# Patient Record
Sex: Female | Born: 1965
Health system: Southern US, Community
[De-identification: ages and names within clinical notes are randomized; demographics above are authoritative.]

## PROBLEM LIST (undated history)

## (undated) DIAGNOSIS — E079 Disorder of thyroid, unspecified: Secondary | ICD-10-CM

## (undated) DIAGNOSIS — R0600 Dyspnea, unspecified: Secondary | ICD-10-CM

## (undated) DIAGNOSIS — J189 Pneumonia, unspecified organism: Secondary | ICD-10-CM

## (undated) DIAGNOSIS — G709 Myoneural disorder, unspecified: Secondary | ICD-10-CM

## (undated) DIAGNOSIS — G8929 Other chronic pain: Secondary | ICD-10-CM

## (undated) DIAGNOSIS — M1990 Unspecified osteoarthritis, unspecified site: Secondary | ICD-10-CM

## (undated) DIAGNOSIS — T7840XA Allergy, unspecified, initial encounter: Secondary | ICD-10-CM

## (undated) DIAGNOSIS — K219 Gastro-esophageal reflux disease without esophagitis: Secondary | ICD-10-CM

## (undated) DIAGNOSIS — J449 Chronic obstructive pulmonary disease, unspecified: Secondary | ICD-10-CM

## (undated) DIAGNOSIS — F419 Anxiety disorder, unspecified: Secondary | ICD-10-CM

## (undated) DIAGNOSIS — E039 Hypothyroidism, unspecified: Secondary | ICD-10-CM

## (undated) DIAGNOSIS — I10 Essential (primary) hypertension: Secondary | ICD-10-CM

## (undated) DIAGNOSIS — E119 Type 2 diabetes mellitus without complications: Secondary | ICD-10-CM

## (undated) HISTORY — PX: PAIN PUMP IMPLANTATION: SHX330

## (undated) HISTORY — DX: Myoneural disorder, unspecified: G70.9

## (undated) HISTORY — DX: Disorder of thyroid, unspecified: E07.9

## (undated) HISTORY — DX: Essential (primary) hypertension: I10

## (undated) HISTORY — DX: Gastro-esophageal reflux disease without esophagitis: K21.9

## (undated) HISTORY — DX: Other chronic pain: G89.29

## (undated) HISTORY — DX: Unspecified osteoarthritis, unspecified site: M19.90

## (undated) HISTORY — PX: ROOT CANAL: SHX2363

## (undated) HISTORY — DX: Allergy, unspecified, initial encounter: T78.40XA

## (undated) HISTORY — PX: JOINT REPLACEMENT: SHX530

## (undated) HISTORY — DX: Anxiety disorder, unspecified: F41.9

---
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
## 1997-06-26 ENCOUNTER — Ambulatory Visit (HOSPITAL_COMMUNITY): Admission: RE | Admit: 1997-06-26 | Discharge: 1997-06-26 | Payer: Self-pay | Admitting: *Deleted

## 1997-12-20 ENCOUNTER — Encounter: Payer: Self-pay | Admitting: Family Medicine

## 1997-12-20 ENCOUNTER — Ambulatory Visit (HOSPITAL_COMMUNITY): Admission: RE | Admit: 1997-12-20 | Discharge: 1997-12-20 | Payer: Self-pay | Admitting: Family Medicine

## 1999-02-11 HISTORY — PX: ABDOMINAL HYSTERECTOMY: SHX81

## 1999-04-18 ENCOUNTER — Other Ambulatory Visit: Admission: RE | Admit: 1999-04-18 | Discharge: 1999-04-18 | Payer: Self-pay | Admitting: *Deleted

## 1999-06-27 ENCOUNTER — Encounter (INDEPENDENT_AMBULATORY_CARE_PROVIDER_SITE_OTHER): Payer: Self-pay

## 1999-06-27 ENCOUNTER — Inpatient Hospital Stay (HOSPITAL_COMMUNITY): Admission: RE | Admit: 1999-06-27 | Discharge: 1999-06-28 | Payer: Self-pay | Admitting: *Deleted

## 2000-05-05 ENCOUNTER — Ambulatory Visit (HOSPITAL_COMMUNITY): Admission: RE | Admit: 2000-05-05 | Discharge: 2000-05-05 | Payer: Self-pay | Admitting: Family Medicine

## 2000-05-05 ENCOUNTER — Encounter: Payer: Self-pay | Admitting: Family Medicine

## 2000-05-21 ENCOUNTER — Other Ambulatory Visit: Admission: RE | Admit: 2000-05-21 | Discharge: 2000-05-21 | Payer: Self-pay | Admitting: *Deleted

## 2001-07-13 ENCOUNTER — Ambulatory Visit (HOSPITAL_COMMUNITY): Admission: RE | Admit: 2001-07-13 | Discharge: 2001-07-13 | Payer: Self-pay | Admitting: *Deleted

## 2001-07-13 ENCOUNTER — Encounter: Payer: Self-pay | Admitting: *Deleted

## 2001-10-26 ENCOUNTER — Other Ambulatory Visit: Admission: RE | Admit: 2001-10-26 | Discharge: 2001-10-26 | Payer: Self-pay | Admitting: *Deleted

## 2001-12-31 ENCOUNTER — Ambulatory Visit (HOSPITAL_COMMUNITY): Admission: RE | Admit: 2001-12-31 | Discharge: 2001-12-31 | Payer: Self-pay | Admitting: *Deleted

## 2002-11-04 ENCOUNTER — Encounter: Admission: RE | Admit: 2002-11-04 | Discharge: 2002-11-04 | Payer: Self-pay | Admitting: Internal Medicine

## 2002-11-04 ENCOUNTER — Encounter: Payer: Self-pay | Admitting: Internal Medicine

## 2002-11-18 ENCOUNTER — Encounter: Payer: Self-pay | Admitting: Internal Medicine

## 2002-11-18 ENCOUNTER — Encounter: Admission: RE | Admit: 2002-11-18 | Discharge: 2002-11-18 | Payer: Self-pay | Admitting: Internal Medicine

## 2003-07-17 ENCOUNTER — Ambulatory Visit (HOSPITAL_COMMUNITY): Admission: RE | Admit: 2003-07-17 | Discharge: 2003-07-17 | Payer: Self-pay | Admitting: Orthopedic Surgery

## 2003-10-23 ENCOUNTER — Inpatient Hospital Stay (HOSPITAL_COMMUNITY): Admission: RE | Admit: 2003-10-23 | Discharge: 2003-10-25 | Payer: Self-pay | Admitting: Orthopedic Surgery

## 2003-11-02 ENCOUNTER — Ambulatory Visit (HOSPITAL_COMMUNITY): Admission: RE | Admit: 2003-11-02 | Discharge: 2003-11-02 | Payer: Self-pay | Admitting: Orthopedic Surgery

## 2003-11-03 ENCOUNTER — Ambulatory Visit (HOSPITAL_COMMUNITY): Admission: EM | Admit: 2003-11-03 | Discharge: 2003-11-04 | Payer: Self-pay | Admitting: Emergency Medicine

## 2003-11-08 ENCOUNTER — Inpatient Hospital Stay (HOSPITAL_COMMUNITY): Admission: AD | Admit: 2003-11-08 | Discharge: 2003-11-13 | Payer: Self-pay | Admitting: Orthopedic Surgery

## 2003-12-18 ENCOUNTER — Inpatient Hospital Stay (HOSPITAL_COMMUNITY): Admission: RE | Admit: 2003-12-18 | Discharge: 2003-12-20 | Payer: Self-pay | Admitting: Orthopedic Surgery

## 2004-06-27 ENCOUNTER — Encounter: Admission: RE | Admit: 2004-06-27 | Discharge: 2004-06-27 | Payer: Self-pay | Admitting: Orthopaedic Surgery

## 2004-10-18 ENCOUNTER — Ambulatory Visit: Payer: Self-pay | Admitting: Pain Medicine

## 2005-01-06 ENCOUNTER — Ambulatory Visit (HOSPITAL_COMMUNITY): Payer: Self-pay | Admitting: Psychiatry

## 2005-05-28 ENCOUNTER — Encounter: Admission: RE | Admit: 2005-05-28 | Discharge: 2005-05-28 | Payer: Self-pay | Admitting: Orthopedic Surgery

## 2005-05-29 ENCOUNTER — Encounter: Admission: RE | Admit: 2005-05-29 | Discharge: 2005-05-29 | Payer: Self-pay | Admitting: Orthopedic Surgery

## 2008-03-01 ENCOUNTER — Ambulatory Visit (HOSPITAL_COMMUNITY): Admission: EM | Admit: 2008-03-01 | Discharge: 2008-03-01 | Payer: Self-pay | Admitting: Emergency Medicine

## 2008-03-21 ENCOUNTER — Ambulatory Visit (HOSPITAL_COMMUNITY): Admission: RE | Admit: 2008-03-21 | Discharge: 2008-03-21 | Payer: Self-pay | Admitting: Orthopedic Surgery

## 2008-03-28 ENCOUNTER — Inpatient Hospital Stay (HOSPITAL_COMMUNITY): Admission: RE | Admit: 2008-03-28 | Discharge: 2008-03-31 | Payer: Self-pay | Admitting: Orthopedic Surgery

## 2008-04-14 ENCOUNTER — Encounter (INDEPENDENT_AMBULATORY_CARE_PROVIDER_SITE_OTHER): Payer: Self-pay | Admitting: Orthopedic Surgery

## 2008-04-14 ENCOUNTER — Ambulatory Visit: Payer: Self-pay | Admitting: Vascular Surgery

## 2008-04-14 ENCOUNTER — Ambulatory Visit (HOSPITAL_COMMUNITY): Admission: RE | Admit: 2008-04-14 | Discharge: 2008-04-14 | Payer: Self-pay | Admitting: Orthopedic Surgery

## 2008-11-14 ENCOUNTER — Emergency Department: Payer: Self-pay | Admitting: Emergency Medicine

## 2010-02-26 ENCOUNTER — Encounter
Admission: RE | Admit: 2010-02-26 | Discharge: 2010-02-26 | Payer: Self-pay | Source: Home / Self Care | Attending: Obstetrics & Gynecology | Admitting: Obstetrics & Gynecology

## 2010-03-13 ENCOUNTER — Encounter: Payer: Self-pay | Admitting: Obstetrics & Gynecology

## 2010-04-22 ENCOUNTER — Ambulatory Visit: Payer: Self-pay | Admitting: Internal Medicine

## 2010-05-12 ENCOUNTER — Ambulatory Visit: Payer: Self-pay | Admitting: Internal Medicine

## 2010-05-27 LAB — CBC
HCT: 47.6 % — ABNORMAL HIGH (ref 36.0–46.0)
Hemoglobin: 16.2 g/dL — ABNORMAL HIGH (ref 12.0–15.0)
MCHC: 34 g/dL (ref 30.0–36.0)
MCV: 91.5 fL (ref 78.0–100.0)
Platelets: 200 10*3/uL (ref 150–400)
RBC: 5.2 MIL/uL — ABNORMAL HIGH (ref 3.87–5.11)
RDW: 12.6 % (ref 11.5–15.5)
WBC: 13.5 K/uL — ABNORMAL HIGH (ref 4.0–10.5)

## 2010-05-27 LAB — DIFFERENTIAL
Basophils Absolute: 0 10*3/uL (ref 0.0–0.1)
Basophils Relative: 0 % (ref 0–1)
Eosinophils Absolute: 0.1 10*3/uL (ref 0.0–0.7)
Eosinophils Relative: 1 % (ref 0–5)
Lymphocytes Relative: 17 % (ref 12–46)
Lymphs Abs: 2.3 K/uL (ref 0.7–4.0)
Monocytes Absolute: 0.5 K/uL (ref 0.1–1.0)
Monocytes Relative: 4 % (ref 3–12)
Neutro Abs: 10.5 10*3/uL — ABNORMAL HIGH (ref 1.7–7.7)
Neutrophils Relative %: 78 % — ABNORMAL HIGH (ref 43–77)

## 2010-05-28 LAB — URINALYSIS, ROUTINE W REFLEX MICROSCOPIC
Bilirubin Urine: NEGATIVE
Hgb urine dipstick: NEGATIVE
Ketones, ur: NEGATIVE mg/dL
Nitrite: NEGATIVE
Protein, ur: NEGATIVE mg/dL
Specific Gravity, Urine: 1.005 (ref 1.005–1.030)
Urobilinogen, UA: 0.2 mg/dL (ref 0.0–1.0)

## 2010-05-28 LAB — DIFFERENTIAL
Basophils Absolute: 0 10*3/uL (ref 0.0–0.1)
Basophils Relative: 0 % (ref 0–1)
Eosinophils Absolute: 0.1 10*3/uL (ref 0.0–0.7)
Monocytes Relative: 5 % (ref 3–12)
Neutro Abs: 6.1 10*3/uL (ref 1.7–7.7)
Neutrophils Relative %: 65 % (ref 43–77)

## 2010-05-28 LAB — CBC
Hemoglobin: 10.4 g/dL — ABNORMAL LOW (ref 12.0–15.0)
MCHC: 34.5 g/dL (ref 30.0–36.0)
MCHC: 33.9 g/dL (ref 30.0–36.0)
MCHC: 34.2 g/dL (ref 30.0–36.0)
MCV: 90.9 fL (ref 78.0–100.0)
Platelets: 212 10*3/uL (ref 150–400)
RBC: 5.35 MIL/uL — ABNORMAL HIGH (ref 3.87–5.11)
RBC: 3.3 MIL/uL — ABNORMAL LOW (ref 3.87–5.11)
RBC: 3.55 MIL/uL — ABNORMAL LOW (ref 3.87–5.11)
RDW: 12.7 % (ref 11.5–15.5)
RDW: 13 % (ref 11.5–15.5)

## 2010-05-28 LAB — BASIC METABOLIC PANEL
BUN: 7 mg/dL (ref 6–23)
CO2: 33 mEq/L — ABNORMAL HIGH (ref 19–32)
CO2: 28 mEq/L (ref 19–32)
CO2: 30 mEq/L (ref 19–32)
Calcium: 9.9 mg/dL (ref 8.4–10.5)
Calcium: 7.8 mg/dL — ABNORMAL LOW (ref 8.4–10.5)
Calcium: 7.8 mg/dL — ABNORMAL LOW (ref 8.4–10.5)
Chloride: 104 mEq/L (ref 96–112)
Creatinine, Ser: 0.95 mg/dL (ref 0.4–1.2)
Creatinine, Ser: 0.79 mg/dL (ref 0.4–1.2)
Creatinine, Ser: 0.98 mg/dL (ref 0.4–1.2)
GFR calc Af Amer: >60 mL/min (ref >60)
GFR calc Af Amer: >60 mL/min (ref >60)
GFR calc non Af Amer: >60 mL/min (ref >60)
Glucose, Bld: 113 mg/dL — ABNORMAL HIGH (ref 70–99)
Glucose, Bld: 121 mg/dL — ABNORMAL HIGH (ref 70–99)
Sodium: 137 mEq/L (ref 135–145)

## 2010-05-28 LAB — PROTIME-INR
INR: 1 (ref 0.00–1.49)
Prothrombin Time: 13 seconds (ref 11.6–15.2)

## 2010-05-28 LAB — APTT: aPTT: 27 seconds (ref 24–37)

## 2010-05-28 LAB — TYPE AND SCREEN

## 2010-06-11 ENCOUNTER — Ambulatory Visit: Payer: Self-pay | Admitting: Internal Medicine

## 2010-06-25 NOTE — Op Note (Signed)
NAMEGOWRI, SUCHAN                ACCOUNT NO.:  [id number]   MEDICAL RECORD NO.:  [id number]          PATIENT TYPE:  INP   LOCATION:  0001                         FACILITY:  [facility]   PHYSICIAN:  Madlyn Frankel. Charlann Boxer, M.D.  DATE OF BIRTH:  April 14, 1965   DATE OF PROCEDURE:  03/28/2008  DATE OF DISCHARGE:                               OPERATIVE REPORT   PREOPERATIVE DIAGNOSIS:  Failed right total hip placement due to  recurrent instability with history of multiple surgical procedures,  including reductions and placement of a constrained liner with recent  failure of the constrained liner.   POSTOPERATIVE DIAGNOSIS:  Failed right total hip placement due to  recurrent instability with history of multiple surgical procedures,  including reductions and placement of a constrained liner with recent  failure of the constrained liner.   PROCEDURE:  Revision right total hip replacement utilizing a DePuy 52-mm  Sector cup, 36 metal liner, and S-ROM 18 x 13 plus 8 stem with  approximately 30 degrees of anteversion added to the patient's  previously placed sleeve position, and a 36 plus 0 ball.   ASSISTANT:  Dwyane Luo.   ANESTHESIA:  General.   BLOOD LOSS:  300 mL.   DRAINS:  One Hemovac.   SPECIMENS:  There were none, as the joint was completely normal and  there was a bit of synovial met on debridement of the synovium, but  nothing significant.  No signs of infection.   INDICATIONS FOR PROCEDURE:  Orel is a 45 year old female with history  of an Endex right total knee replacement in 2005 with recurrent  instability, requiring revision to a constrained liner approximately 2  months out.  She had had persistent discomfort after her hip replacement  surgery, requiring pain pump management.  I had seen her initially and  discussed the options at the time due to the dislocation of the ring and  for constrained liner.  It was not until recently approximately 6 weeks  ago when she dislocated her  constrained liner.  She was seen in the  office and at this point wished to have something done to prevent this  from happening again.  Discussed the extent and the nature of this type  of procedure, risks, and benefits of recurrent instability, infection,  DVT.  Consent was obtained for the benefit of pain relief and decreasing  dislocation rate.   PROCEDURE IN DETAIL:  The patient was brought to the operative theater.  Once adequate anesthesia, preoperative antibiotics, Ancef administered,  the patient was positioned into the left lateral decubitus position,  right-side up.  The right lower extremity was then prepped and draped in  a sterile fashion and pre-scrubbed.   Time-out was performed, identifying the patient, extremity, and planned  procedure.   The old incision was excised.  Sharp dissection was carried to the  iliotibial band and gluteal fascia.  The gluteal fascia was then opened  posteriorly.  Sharp dissection was carried down through the gluteal  musculature, creating a posterior approach to the hip.   Synovectomy was carried out, removing some of the metal  debris within  the synovium.  The synovial fluid was clear, no signs of infection.  The  hip was exposed with further debridement.  The hip was dislocated.  I  removed the ring and then the femoral head.   At this point, I used an osteotome to remove bone around the S-ROM  prosthesis, so I was able to wedge the S-ROM flat-headed screwdriver  between the sleeve and the stem.  This was impacted with a solid blow  and I was able to break the junction of this interface.  I then used the  S-ROM ring extractor and removed the sleeve without difficulty and no  greater trochanteric injury.   At this point, I did some further debridements on the anterior aspect of  the hip joint capsule and synovectomy.  I irrigated the canal, removing  some of the metal debris.   At this point, the femur was retracted anteriorly.   Acetabular  retractors were placed.  Debridement was carried out as necessary.  At  this point, I used a combination of a 6.5-mm cancellous screw and  osteotomes to remove the old acetabular liner.   We then placed a 28 neutral liner and used the Zimmer Explant System  with the short and long blades to remove the cup.  The cup was removed  with minimal bone loss.  It was a 48-mm cup.  I began reaming with a 49  reamer and then a 51 reamer and then impacted a 52-mm Pinnacle cup.  This cup position was now in approximately 35 degrees of abduction and  20 degrees of forward flexion.  A portion of the cup was exposed  posterolateral and beneath the wall anteriorly.  In comparison, the old  cup was vertical at probably 50 degrees based on intraoperative  evaluation with only 10 degrees of anteversion.   A neutral trial liner was placed.  I did place 2 cancellous screws.  These were short screws due to the position of the cup probably along  the iliac wall but safely placed.   I placed a neutral trial liner and attended back to the femur.  At this  point, I placed a trial 18 x 13 plus 8 S-ROM stem, setting anteversion  at 30 degrees, which is approximately now about 35 degrees based on the  sleeve.  Trial reduction was now carried out with 36 plus 0 ball.  The  hip was very stable.  There was no evidence of any subluxation even up  to 90 degrees of internal rotation with the hip abducted and flexed, as  well as in the sleep position, I was unable to get any instability.  There is no evidence of impingement with external rotation and  extension.  The combined anteversion was approximately 45 to 50 degrees.  There was about a millimeter shuck.   Given all these parameters, I removed all the trial components.  I  placed a hole eliminator and then impacted a 36 metal liner.  This was  well seated.  The final 18 x 13 plus 8 S-ROM stem was then impacted into  a dried and prepared sleeve, again  setting anteversion between the 20  and 40-degree mark on the sleeve.  Based on the trial reduction, it  showed a 36 plus 0 ball.  I had previously removed a 28 plus 0 ball.  This was impacted onto a dry trunnion and the hip reduced.   We had irrigated the hip throughout the case  and again at this point.  I  was able to reapproximate some of the posterior capsular pseudo-scar  back to itself in the superior rim with a #1 Vicryl.  I placed a medium  Hemovac drain deep.  The iliotibial band and gluteal fascia were then  reapproximated with #1 Vicryl as well, 2-0 Vicryl in the  subcu layer, and 4-0 Monocryl in the skin due to the fact that I excised  the previous scar.  The skin was then cleaned, dried, and dressed  sterilely with Steri-Strips and a Mepilex dressing.  She was brought to  the recovery room in stable condition, tolerating the procedure well.      Madlyn Frankel Charlann Boxer, M.D.  Electronically Signed     MDO/MEDQ  D:  03/28/2008  T:  03/28/2008  Job:  [id number]

## 2010-06-25 NOTE — Op Note (Signed)
Tasha Nichols, Tasha Nichols                ACCOUNT NO.:  [id number]   MEDICAL RECORD NO.:  [id number]          PATIENT TYPE:  INP   LOCATION:  1603                         FACILITY:  [facility]   PHYSICIAN:  Madlyn Frankel. Charlann Boxer, M.D.  DATE OF BIRTH:  [birth year]   DATE OF PROCEDURE:  03/28/2008  DATE OF DISCHARGE:                               OPERATIVE REPORT   PREOPERATIVE DIAGNOSIS:  Postoperatively diagnosed right periprosthetic  fracture by radiographs.   POSTOPERATIVE DIAGNOSIS/FINDINGS:  Postoperatively diagnosed right  periprosthetic fracture by radiographs.   PROCEDURE:  Open reduction internal fixation of right periprosthetic  femur fracture utilizing a cortical strut graft and 6 Dall-Miles beaded  cables.   SURGEON:  Madlyn Frankel. Charlann Boxer, M.D.   ASSISTANT:  Lenise Herald, P.A.-C.   ANESTHESIA:  General.   BLOOD LOSS:  100 mL.   COMPLICATIONS:  None.   DRAINS:  None.   INDICATIONS FOR PROCEDURE:  Tasha Nichols is a 45 year old female who just  earlier in the day underwent a revision of an unstable chronically  dislocating right total hip.  At the time of that operation, she had an  acetabular conversion as well as a conversion of a previously placed  SROM stem with the remainder of the proximal sleeve replacement of a  same length 18 x 13 stem, just with it more offset and altered in its  orientation with regards to anteversion.  Her hip stability remained  excellent.  There was no intraoperative complications or nothing that  was noted out of the ordinary.  The hip and femur remained very stable  during exam.  She was brought in the recovery room and apparently was  stable without complication.  On a routine postoperative radiograph, it  was identified that on the lateral cortex there was a fracture with some  displacement of about 4 mm.   Once I had an opportunity to review with the patient, though still in  the recovery room, she had awoken from anesthesia and understood what  was  going on.  However, we obtained consent after reviewing with the  family.  I talked to the family after the first operation.  We then back  to review with them these findings.   I made a decision based on the fracture pattern at least  radiographically to have multiple options available including strut  graft on a cable versus revision to a longer stem with cabling.  Risks  and benefits were discussed with the patient but had consent obtained  from family as I had them sign it.   PROCEDURE IN DETAIL:  The patient was brought to the operative theater.  Once adequate anesthesia and preoperative antibiotics, another gram of  Ancef administered, the patient was positioned into the left lateral  decubitus position with the right side up.  We removed the Hemovac drain  and Steri-Strips.  We prescrubbed the right lateral hip.  During this  time with manipulation, again the hip and femur remained very stable,  indicating a stable fracture pattern at that point.   Once the leg was prepped and draped, a time  out was performed  identifying the patient, extremity and planned procedure.  Basically, I  began my incision at distal a portion of the old incision and then  extended down to the distal third of the thigh.  Sharp dissection was  carried to the iliotibial band which were opened, preserving the vastus  lateralis fascia.  I then peeled the vastus lateralis and feathered it  off the posterior lateral intermuscular septum.  The fracture site was  identified where I preoperatively determined.  I exposed the shaft of  the femur, identifying the extent.  The nature of the fracture was  basically lateral wall, lateral portion of the femur fracture.  It  extended proximally but did not get distal.   Once the extent of the fracture was identified, I did choose at this  point to use just a strut graft as basically the anterior and posterior  2/3 of the shaft intact this seemed to involve the lateral  third.  With  the strut thawed on the back table, using oscillating saw to cut down to  the size that I wished and shaved off a bit of thickened portion so it  would not be as intrusive.  I then placed this cortical strut over the  lateral aspect of femur at the fracture site.  I then proceeded to pass  cables.  A series of 6 cables were passed all along the strut graft,  helping to reduce the fracture into an anatomic position, but also  placing extra support to this lateral bony structure.   Fracture was very stable.  There was no motion at this time.  All cables  were tensioned and crimped and wires cut.   The wound was irrigated with normal saline solution.  I reapproximated  the iliotibial band over the vastus lateralis fascia with #1 Vicryl,  reapproximated the subcutaneous layer with 2-0 Vicryl.  We had used the  4-0 Monocryl on the proximal wound from the initial hip, and I was  basically backed this out a little bit, retied this, and then ran  another 4-0 Monocryl on the lateral wound.  The entire wound was  cleaned, dried, and dressed sterilely with Steri-Strips and Mepilex  dressing.  She was then to the brought to the recovery room, extubated  in stable condition, tolerating the procedure well.   The intraoperative findings did not give me any indication on how this  would have happened.  It very well could have been at the removal of an  old prosthesis with the splines on the lower portion of this prosthesis.  Otherwise, it would be very difficult to explain placing the stem back  into the same orientation as the previously one had been placed.  Nonetheless, the fracture was identified and addressed.      Madlyn Frankel Charlann Boxer, M.D.  Electronically Signed     MDO/MEDQ  D:  03/28/2008  T:  03/28/2008  Job:  [id number]

## 2010-06-25 NOTE — H&P (Signed)
Tasha Nichols, Tasha Nichols                ACCOUNT NO.:  [id number]   MEDICAL RECORD NO.:  [id number]          PATIENT TYPE:  INP   LOCATION:                               FACILITY:  [facility]   PHYSICIAN:  Madlyn Frankel. Charlann Boxer, M.D.  DATE OF BIRTH:  [birth year]   DATE OF ADMISSION:  03/28/2008  DATE OF DISCHARGE:                              HISTORY & PHYSICAL   PROCEDURE:  A revision right total hip replacement.   CHIEF COMPLAINT:  Unstable multiple dislocations of her right total hip  replacement.   HISTORY OF PRESENT ILLNESS:  A 45 year old female with a history of a  right total hip replacement with multiple revision surgeries with  dislocation of a constrained liner.  She has been in a knee immobilizer  since March 01, 2008 when Dr. Thomasena Edis reduced her in a closed manner  in the OR.  She was subsequently seen by Dr. Charlann Boxer.  Based upon  radiographic evidence, she is scheduled for revision right total hip  replacement.   PAST MEDICAL HISTORY:  1. Osteoarthritis.  2. Anxiety.  3. Hypertension.  4. Reflux disease.  5. Hypothyroidism.  6. Degenerative disk disease.  7. Menopause.   PREVIOUS SURGERIES:  1. Right total hip replacement.  Primary done in 2005 had four      dislocations with constrained liner placement in the final surgery.  2. C-section in 1994.  3. Hysterectomy in 2001.   FAMILY HISTORY:  Diabetes, stroke, kidney failure, coronary artery  disease.   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  Does smoke one pack of cigarettes per day.  Has family in the  home to provide postoperative assistance.   DRUG ALLERGIES:  1. PENICILLIN.  2. MORPHINE.   MEDICATIONS:  1. Triamterene HCTZ 37.5 mg p.o. daily.  2. Synthroid 100 mcg p.o. daily.  3. Oxycodone 30 mg p.o. p.r.n.  4. OxyContin 20 mg p.o. b.i.d. p.r.n.  5. Skelaxin 800 mg p.o. p.r.n. muscle spasm pain.  6. She also has an implanted pain pump which dispenses pain medicine,  unknown type.   REVIEW OF SYSTEMS:  GENERAL:  States she has some memory loss, fatigue.  HEENT:  She has headaches, dizziness, insomnia.  GASTROINTESTINAL:  Nausea, vomiting, constipation, heartburn.  GENITOURINARY:  She has  increased urinary frequency and increased urination at night.  MUSCULOSKELETAL:  She has joint pain, back pain, spasms, morning  stiffness and muscular weakness.  In addition, see the HPI.   PHYSICAL EXAMINATION:  VITAL SIGNS:  Pulse 72, respirations 16, blood  pressure 114/76.  GENERAL:  Awake, alert and oriented.  HEENT:  Normocephalic.  NECK:  Supple.  No carotid bruits.  CHEST:  Lungs clear to auscultation bilaterally.  BREASTS:  Deferred.  HEART:  Regular rate and rhythm.  S1-S2 distinct.  ABDOMEN:  Soft, bowel sounds present.  PELVIS:  Stable.  GENITOURINARY:  Deferred.  EXTREMITIES:  Right lower extremity is in a knee immobilizer.  SKIN:  Intact.  No cellulitis.  NEUROLOGIC:  Intact distal sensibilities.   LABORATORY DATA:  Labs, EKG, chest x-ray  all pending presurgical  testing.   IMPRESSION:  Unstable and painful right total hip placement and knee  immobilizer with multiple dislocations.   PLAN OF ACTION:  Revision right total hip replacement by Dr. Charlann Boxer at  [facility], March 28, 2008.  Risks and complications were  discussed.   Postoperative medications will be provided today including Lovenox plus  aspirin.     ______________________________  Tasha Nichols. [address], [place]      Madlyn Frankel. Charlann Boxer, M.D.  Electronically Signed    BLM/MEDQ  D:  03/15/2008  T:  03/15/2008  Job:  [id number]   cc:   Aida Puffer  Fax: [phone]   Dahlia Byes(?), M.D.

## 2010-06-25 NOTE — Consult Note (Signed)
Tasha Nichols, SWEATMAN NO.:  [id number]   MEDICAL RECORD NO.:  [id number]          PATIENT TYPE:  EMS   LOCATION:  ED                           FACILITY:  [facility]   PHYSICIAN:  Erasmo Leventhal, M.D.DATE OF BIRTH:  [birth year]   DATE OF CONSULTATION:  03/01/2008  DATE OF DISCHARGE:                                 CONSULTATION   </   REASON FOR CONSULTATION:  Tasha Nichols is a 45 year old Caucasian female  whom I have been asked to see by Dr. Oletta Lamas, emergency room physician, for  evaluation of her right hip.   HISTORY OF PRESENT ILLNESS:  In summary, she has had multiple procedures  to her right hip.  She had initial primary total hip arthroplasty done  for which she calls arthritis.  However, due to multiple recurrent  dislocations, she eventually had a revision total hip were placed by Dr.  Turner Daniels in 2005 where a constrained acetabular liner was utilized.  She  has transferred her care to Dr. Charlann Boxer and has seen him in the office. 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  She was brought to [facility] emergency room via EMS for right hip pain  and inability to ambulate.  She now has acute on chronic right hip pain.  She also suffers with chronic low back pain.  She has seen Dr. Gerilyn Nestle in  our office.  She currently sees Dr. Haskel Khan for pain management.  She  __________several for analgesics.  She also has a Dilaudid pump in  place.  She has other complaints tonight.   ALLERGIES:  PENICILLIN   CURRENT MEDICATIONS:  Hydrochlorothiazide, Synthroid, Skelaxin,  OxyContin oral, oxycodone and acetaminophen oral and Dilaudid pain pump.   PAST MEDICAL HISTORY:  1. Hypertension.  2. Chronic pain.  3. Hypothyroidism.  4. Preeclampsia.   SURGICAL HISTORY:  1. C-section hip.  2. Replacement with  revision.  3. Lysis of adhesions.  4. Oophorectomy.   SOCIAL HISTORY:  She is married, lives with husband.  She is a current  smoker.  She does smoke cigarettes.   FAMILY HISTORY:  Noncontributory.   REVIEW OF SYSTEMS:  As above.   PHYSICAL EXAMINATION:  GENERAL:  Awake, alert, moderate pain, answers  questions appropriately.  VITAL SIGNS:  Blood pressure is 125/77, pulse 97 and regular,  respirations 8 to 10 and unlabored, temperature 97.4 oral.  EXTREMITIES:  Left lower extremity unremarkable.  Lower extremity:  She  has her knee extended, right lower extremity shortened. Vascular  examination is 4+ dorsalis pedis posterior tibial.  She can dorsiflex  and plantar flex well.  Foot, ankle, knee, leg, otherwise unremarkable  other than right hip.   X-rays were obtained and they show a S-ROM hip with superior lateral  dislocation including the femoral head and also what looks to be the  acetabular liner.   IMPRESSION:  Recurrent dislocation, right total hip arthroplasty  including the constrained acetabular liner.   RECOMMENDATIONS:  Difficult situation.  I told the patient I will  attempt closed reduction and see if it is successful.  If so, she will  be placed into a knee immobilizer, allowed to be discharged home and  followup with Dr. Charlann Boxer in he office to discuss further definitive  treatment. If I am unsuccessful at closed reduction, then she will be  admitted and she will see Dr. Charlann Boxer in the morning for definitive  treatment.  She understands risks and benefits.  She does understand  this will not be an open procedure, but this evening I will try closed  reduction.  However, prognosis is guarded at this point in time due to  the type of implant she has and her history of recurrent dislocations.  All questions encouraged and answered with the patient and family in  detail, they understand risks and benefits and wish to proceed.           ______________________________   Erasmo Leventhal, M.D.     RAC/MEDQ  D:  03/01/2008  T:  03/02/2008  Job:  [id number]

## 2010-06-25 NOTE — Op Note (Signed)
NAMEIMO, CUMBIE NO.:  [id number]   MEDICAL RECORD NO.:  [id number]          PATIENT TYPE:  INP   LOCATION:  0098                         FACILITY:  [facility]   PHYSICIAN:  Erasmo Leventhal, M.D.DATE OF BIRTH:  April 22, 1965   DATE OF PROCEDURE:  03/01/2008  DATE OF DISCHARGE:                               OPERATIVE REPORT   PREOPERATIVE DIAGNOSIS:  Dislocated right total hip arthroplasty.   POSTOPERATIVE DIAGNOSIS:  Dislocated right total hip arthroplasty.   PROCEDURES:  Closed reduction of dislocated total hip arthroplasty.  Stress radiography, C-arm.   SURGEON:  Valma Cava, M.D.   ANESTHESIA:  General.  Dr. Leta Jungling.   ESTIMATED BLOOD LOSS:  None.   COMPLICATIONS:  None.   DISPOSITION:  PACU stable   OPERATIVE DETAILS:  The patient is counseled in the emergency room and  the holding area, taken to the operating room and placed under general  anesthesia.  She has been transferred from her bed to the OR table with  gentle longitudinal traction as it was transported moving the patient  over, I was actually pulling distally and I felt the hip relocate.  At  this point in time we chose a C-arm and it looked like she was reduced.  Leg lengths were symmetric and neurovascular examination remained  unchanged.  At this point in time I then took the C-arm put it on  fluoro, put it through multiple range of motion.  She remained stable to  90 degrees abduction, flexion and 30 degrees abduction.  The head  remained docked.  There were no complicating problems at this time.  She  appeared to be stable.   She will placed __________.  I have discussed this over the phone with  Dr. Charlann Boxer, who is her attending physician.  He will take care of her in  the office after this as she requested him.  At this time she is placed  into a knee immobilizer.  She will be stabilized in PACU and discharged  to home.   COMPLICATIONS:  None.     ______________________________  Erasmo Leventhal, M.D.     RAC/MEDQ  D:  03/01/2008  T:  03/02/2008  Job:  [id number]

## 2010-06-28 NOTE — Discharge Summary (Signed)
Tasha Nichols, Tasha Nichols                ACCOUNT NO.:  [id number]   MEDICAL RECORD NO.:  [id number]          PATIENT TYPE:  INP   LOCATION:  5014                         FACILITY:  [facility]   PHYSICIAN:  Tasha Nichols, M.D.   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   SECONDARY DIAGNOSIS:  Brief depression reaction.   HISTORY OF PRESENT ILLNESS:  The patient is a 45 year old woman who  underwent a right total hip on October 18, 2003.  The patient did well  initially after surgery for five days postoperative, had first of multiple  dislocations right total hip going from seated to standing position.  Plane  radiographs taken at that time showed that the cup abduction was 45 degrees,  anteversion was 20 degrees as was the stem so geometrically this was stable  hip and she simply got in a bad position getting out of car seat.  She  underwent a closed reduction and unfortunately dislocated again at home.  She did not actually come in for a day or two.  We then put her in a hinged  abduction brace after relocating her in the hospital.  The patient was  suppose to come back in the office for follow-up check on November 08, 2003, and x-rays taken that day showed that she had dislocated again.  She  denied getting into malposition and stated that she did have the hinged  abduction brace on.  She remained geometrically stable by x-ray.  No fevers,  no chills, no problems from the wound.  The patient was taken to the  operating room for yet another closed reduction.   ALLERGIES:  The patient is allergic to MORPHINE and PENICILLIN.   Further medical records can be seen on history and physical from previous  admission.   MEDICATIONS ON ADMISSION:  1.  Triamterene/hydrochlorothiazide.  2.  Synthroid.  3.  Percocet.  4.   Robaxin.  5.  Coumadin.   No changes in the patient's past medical history or review of systems.  No  change in the examination with the exception of pain to examination of the  hip.   HOSPITAL COURSE:  On the day of admission the patient was taken to the  operating room at [facility] where, under general  anesthesia, she was easily relocated with a right total hip.  Unfortunately,  postoperative x-rays in the recovery room showed her to be dislocated once  again and she was taken once again for a second reduction and again placed  into the hinged abduction brace before moving her onto the bed the second  time.   On postoperative day #1, the patient was without complaint.  No nausea or  vomiting.  The thigh was soft and moderately tender.  She continued with bed  rest with abduction pillow and brace.   On postoperative day #2, the patient reported no sense of dislocation  in the  right hip.  Discussions were being made regarding further surgical  intervention and investigation was being made into a constrained liner.  She  remained at bed rest with pillows in hopes of allowing her hip time to  perhaps retract slightly to make her more stable.  On that third  postoperative day, the patient began complaining of emotional lability,  crying frequently despite emotional support given.   On postoperative day #3, the patient continued to complain of emotional  lability.  Wound scar remained benign.  She began very cautious ambulation  with physical therapy stressing total hip precautions.   On postoperative day #4, the patient had walked with physical therapy  without any signs of dislocation and she was otherwise stable.  She was  scheduled to be discharged home in the care of her family with a plan to  return for a constrained liner after it was able to be fabricated.  Unfortunately, at the time of her discharge, she began expressing suicidal  thoughts and thoughts  of harming herself.  She seemed to be visibly anxious  about going home and about her right hip dislocating again.  Because of  these statements of suicidal ideation, it was felt the patient needed to  have further work-up and Dr. Jeanie Sewer was on call, who was consulted.  He  thought the patient was not at obvious risk of harm to herself or others and  after discussion and consultation with Dr. Jeanie Sewer, felt that she could be  discharged home.  She will also have home health PT to practice transfers  and mobility and to strengthen leg muscles as well as to reinforce total hip  precautions which she should continue to be quite zealous about using.  She  will return to see [place] in one week's time for follow-up check.   DIET:  Regular.      [name]  D:  01/01/2004  T:  01/01/2004  Job:  [id number]

## 2010-06-28 NOTE — Op Note (Signed)
NAME:  Tasha Nichols, Tasha Nichols                          ACCOUNT NO.:  [id number]   MEDICAL RECORD NO.:  [id number]                   PATIENT TYPE:  AMB   LOCATION:  SDC                                  FACILITY:  [facility]   PHYSICIAN:  Tracie Harrier, M.D.              DATE OF BIRTH:  Apr 18, 1965   DATE OF PROCEDURE:  12/31/2001  DATE OF DISCHARGE:                                 OPERATIVE REPORT   PREOPERATIVE DIAGNOSES:  Pelvic pain, acute and chronic.   POSTOPERATIVE DIAGNOSES:  Pelvic pain, acute and chronic.   PROCEDURE:  Laparoscopy with lysis of adhesions.   SURGEON:  Tracie Harrier, M.D.   ANESTHESIA:  General.   ESTIMATED BLOOD LOSS:  20 cc.   COMPLICATIONS:  None.   FINDINGS:  At time of laparoscopy the uterus was noted to be surgically  absent.  Pelvic adhesions were identified and lysed.  The ovaries were  visualized bilaterally and noted to be normal.  Therefore, oophorectomy was  not undertaken.   The most significant finding during this case was pelvic adhesions, as  mentioned.   PROCEDURE:  The patient was taken to the operating room where general  endotracheal anesthetic was administered.  The patient was placed on the  operating table in the dorsal lithotomy position.  The abdomen was prepped  and draped in usual sterile fashion with Betadine and sterile drapes.  The  bladder was emptied with a red rubber catheter.  Next, a small transverse  infraumbilical skin incision was made through which a Veress needle was  inserted atraumatically into the abdominal cavity.  A pneumoperitoneum was  then created with carbon dioxide gas and 3.5 L of carbon dioxide gas was  used to create a pneumoperitoneum.  This created a pressure of 15 mmHg in  the abdomen.  The Veress needle was then removed and a disposable  laparoscopic trocar was inserted atraumatically into the abdominal cavity.  Next, a secondary incision was made two fingerbreadths above the pubic  symphysis in the  midline.  Through this a 5 mm trocar was inserted under  direct continuous laparoscopic visualization.  Pelvic adhesions were  identified, most notably between the left pelvic side wall and the sigmoid  colon.  Also, the right tube was adherent to the left pelvic side wall and  all of these areas were cauterized thoroughly with Kleppinger bipolar  cautery and lysed with the laparoscopic scissors.  The pelvis was then  thoroughly irrigated with the Nezhat-Dorsey irrigator.  Good hemostasis was  noted.  As mentioned, the ovaries were visualized closely and thoroughly and  noted to be normal.  Therefore, oophorectomy was not undertaken.  Again, the  pelvis was thoroughly irrigated and hemostasis noted.  The pelvic anatomy  was attempted to be normalized as much as possible and this was mainly by  removing pelvic adhesions.  The fluid was aspirated.  All abdominal  instruments were removed.  The gas was allowed to completely escape from the  abdomen.  The two small incisions were closed.  First, the infraumbilical  skin incision was closed with three interrupted sutures of 0 Vicryl on the  muscle fascia.  The skin was then reapproximated on both incisions with  Dermabond glue.   The patient was awakened, extubated, and taken to the recovery room in good  condition.  There were no perioperative complications.                                               Tracie Harrier, M.D.    REG/MEDQ  D:  12/31/2001  T:  12/31/2001  Job:  [id number]

## 2010-06-28 NOTE — Op Note (Signed)
NAME:  Tasha Nichols, Tasha Nichols                          ACCOUNT NO.:  [id number]   MEDICAL RECORD NO.:  [id number]                   PATIENT TYPE:  OIB   LOCATION:  2864                                 FACILITY:  [facility]   PHYSICIAN:  Feliberto Gottron. Turner Daniels, M.D.                DATE OF BIRTH:  Jun 23, 1965   DATE OF PROCEDURE:  07/17/2003  DATE OF DISCHARGE:                                 OPERATIVE REPORT   PREOPERATIVE DIAGNOSIS:  DDH of the right hip with possible labral tear.   POSTOPERATIVE DIAGNOSIS:  DDH of the right hip with possible labral tear.  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   OPERATION PERFORMED:  Right hip arthroscopy, debridement of labral tear,  removal of cartilaginous loose bodies and debridement of chondromalacia from  the femoral head.   SURGEON:  Feliberto Gottron. Turner Daniels, M.D.   ASSISTANTLaural Benes. Jannet Mantis.   ANESTHESIA:  General endotracheal.   ESTIMATED BLOOD LOSS:  Minimal.   FLUIDS REPLACED:  800 mL crystalloid.   DRAINS PLACED:  None.   TOURNIQUET TIME:  None.   INDICATIONS FOR PROCEDURE:  The patient is a 45 year old woman with DDH of  the right hip who has been worked up extensively for low back, SI joint and  right hip pain, has an MRI scan that shows a possible labral tear and having  failed epidural  steroid SI joint injections and only gotten temporary  relief from a cortisone shot into the right hip, she desires elective right  hip arthroscopy.  Plain radiographs did not show much in the way of loss of  cartilage height. The MRI scan again was consistent with a possible labral  tear.  She is miserable.   DESCRIPTION OF PROCEDURE:  The patient was identified by arm band and taken  to the operating room at [facility] main hospital where appropriate anesthetic  monitors were attached and general endotracheal anesthesia induced with the  patient in the supine position.  The patient was then rolled into the left  lateral decubitus  position on the radiolucent table and the perineal post  placed in the horizontal position.  We then applied traction to the right  lower extremity through a traction boot and using C-arm imaging, confirmed  that we were able to sublux the femoral head about a half inch inferiorly  allowing for the arthroscopy.  The lateral aspect of the hip and thigh was  then prepped and draped in the usual sterile fashion and we began the  procedure by using the long spinal needles from the [employer] hip arthroscopy  set starting out about an inch above the greater trochanter straight lateral  and advancing the needle into the hip joint under C-arm control and  confirming position with infiltration of normal saline and back flow.  The  nitinol wire was then passed through the needle and the needle  removed.  Using a #11 blade, we then expanded the portal to about 1 cm in length and  then using  the stepwise cannulas went up to the 7 mm cannula.  This was  then swapped out for the 7 mm scope cannula which was advanced into the  joint and confirmed by x-ray.  The scope was then placed and hip arthroscopy  began.  Satisfied with the position of the lateral portal, the anterior and  posterior portals were then made in a similar fashion with nitinol wires and  expanders and primarily using the scope in the anterior portal and the  sucker shaver in the lateral portal, we were able to visualize the superior  labrum quite nicely, found a large flap tear straight superior and this was  debrided with a 4.2 [place] sucker shaver.  Multiple cartilaginous loose  bodies in the joint were also lavaged out as well as some chondromalacia of  the femoral head which was debrided back to stable margins.  Overall, the  chondromalacia of the hip was moderate.  It was not down to bare bone  anywhere but there were certainly some significant divots especially in the  weightbearing dome of the femoral head where it was probably  shearing on the  superior acetabulum.  photographic documentation was made of the lesions and  the debridement and then the hip was washed out with normal saline solution.  The arthroscopic instruments were removed.  The portals were infiltrated  with 0.5% Marcaine with epinephrine solution, then closed with a single 3-0  nylon suture loop.  A dressing of Xeroform, 4 x 4 dressing sponges, and  HypaFix tape was applied.  The patient was then awakened and taken to the  recovery room without difficulty.                                               Feliberto Gottron. Turner Daniels, M.D.    Ovid Curd  D:  07/17/2003  T:  07/17/2003  Job:  [id number]

## 2010-06-28 NOTE — Op Note (Signed)
Tasha Nichols, Tasha Nichols                ACCOUNT NO.:  [id number]   MEDICAL RECORD NO.:  [id number]          PATIENT TYPE:  INP   LOCATION:  5029                         FACILITY:  [facility]   PHYSICIAN:  Feliberto Gottron. Turner Daniels, M.D.   DATE OF BIRTH:  09-18-1965   DATE OF PROCEDURE:  11/02/2003  DATE OF DISCHARGE:                                 OPERATIVE REPORT   PREOPERATIVE DIAGNOSIS:  Right total hip dislocation.   POSTOPERATIVE DIAGNOSIS:  Right total hip dislocation.   PROCEDURE:  Closed reduction right total hip.   SURGEON:  Feliberto Gottron. Turner Daniels, M.D.   FIRST ASSISTANT:  None.   ANESTHESIA:  General LMA.   ESTIMATED BLOOD LOSS:  Minimal.   FLUIDS REPLACED:  300 mL crystalloid.   DRAINS:  None.   TOURNIQUET TIME:  None.   INDICATIONS FOR PROCEDURE:  45 year old woman who underwent a right total  hip about eight days ago and was sitting in a car two days ago when she had  the onset of pain.  She called our office and we told her to come in.  She  delayed it for 48 hours because it was not that bad, in her words, and when  she got here she had a superior dislocation of her right total hip with  geometrically good looking x-rays in the AP and lateral planes.  The  components were not malpositioned.  There was no sign of infection and she  was prepared for surgical closed reduction.   DESCRIPTION OF PROCEDURE:  The patient was identified by arm band and taken  to the operating room at [facility].  Appropriate anesthetic  monitors were attached and general LMA anesthesia was induced with the  patient in the supine position.  Traction, flexion, internal rotation,  abduction was applied to the hip which then easily reduced on the second  attempt and post reduction x-rays showed a concentric reduction.  With her  asleep on the table, we went ahead and flexed her to 90, internal and  external rotated her to 30 degrees each, with no  instability, and in full extension she could not be  subluxed anteriorly.  Her dislocation occurred when she was in the seated position in a [place]  car front seat.  In any event, she was then awakened and taken to the  recovery room without difficulty for discharge home on the day of surgery.       FJR/MEDQ  D:  11/02/2003  T:  11/03/2003  Job:  [id number]

## 2010-06-28 NOTE — Op Note (Signed)
[facility]  Patient:    Tasha Nichols, Tasha Nichols                       MRN: [id number] Proc. Date: 06/27/99 Adm. Date:  [id number] Disc. Date: [id number] Attending:  Donne Hazel                           Operative Report  PREOPERATIVE DIAGNOSIS:       Abnormal uterine bleeding.  Pelvic pain.  POSTOPERATIVE DIAGNOSIS:      Abnormal uterine bleeding.  Pelvic pain.  Pelvic adhesions noted.  OPERATION:                    Laparoscopically-assisted vaginal hysterectomy. Lysis of adhesions.  SURGEON:                      Willey Blade, M.D.  ASSISTANT:                    Duke Salvia. Marcelle Overlie, M.D.  ANESTHESIA:                   General endotracheal anesthesia.  ESTIMATED BLOOD LOSS:         150 cc.  FINDINGS:                     At time of laparoscopy, adhesions were noted between the left fallopian tube and the anterior portion of the lower uterine segment.  This was twisted and adherent.  The uterus was normal size and without noted pathology.  The left ovary was normal.  The right adnexa was normal.  DESCRIPTION OF PROCEDURE:     The patient was taken to the operating room where a general endotracheal anesthesia was administered.  The patient was placed on the operating table in the dorsal lithotomy position.  The abdomen, perineum, and vagina was prepped and draped in the usual sterile fashion with Betadine and sterile drapes.  The patient received an intravenous antibiotic prior to surgery. Next, a Hulka tenaculum was placed in the intrauterine cavity for uterine manipulation.  The bladder was emptied with a red rubber catheter.  Next, a small transverse infraumbilical skin incision was made through which a Veress needle as inserted atraumatically into the abdominal cavity.  A pneumoperitoneum was created with 2 liters of carbon dioxide gas.  The Veress needle was removed and a disposable laparoscopic trocar was inserted atraumatically into  the abdominal cavity.  The laparoscope was then placed with the above noted findings.  Two accessory incisions were then placed two fingerbreadths above the pubic symphysis and in the right and left lower quadrant.  5 mm trocars were placed through the  small incisions and this was done under direct, laparoscopic guidance.  First, normalization of the anatomy was carried out with lysis of adhesions on the left tube.  The tube was freed from the anterior portion of the lower uterine segment with blunt and sharp dissection.  The operative scissors were used to dissect this and to normalize the anatomy.  The tube was then removed with the bipolar cautery unit and sharp dissection.  The mesosalpinx was cauterized inferior to the tube and the tube was dissected free.  Next, careful inspection of the lower abdomen and  pelvis revealed no further abnormalities.  Attention was then turned to hysterectomy.  Next, the operative scope was  removed and a 5 mm camera was placed in the suprapubic port.  The right uterine ovarian ligament was then grasped and clamped with an endoscopic staple.  This was done bilaterally.  Bilaterally, the dissection was carried down to the level of the uterine artery.  This was done ith two endoscopic staples bilaterally hugging the uterus, first incorporating the uterine ovarian ligament and proceeding down the broad ligament until the uterine artery was reached.  These bites were taken with endoscopic staples as mentioned and divided with the endoscopic stapler device.  Good hemostasis was noted. After placement of the staples, attention was turned to the vaginal portion of the LAVH.  Next, the gas was allowed to escape from the abdomen and all abdominal instruments were removed.  Next, the Hulka tenaculum was removed and a weighted speculum was placed in the posterior fornix of the vagina.  The cervix was grasped with a Christella Hartigan tenaculum.  The posterior  cul-de-sac was sharply entered.  The uterosacral ligaments were clamped bilaterally with curved Heaney clamps.  These vascular pedicles were sharply created and suture ligated with a transfixing suture of 0  Vicryl.  The vascular pedicles were held for future suspension of the vaginal cuff to this area.  A circumferential incision was then made around the anterior portion of the uterus and a bladder flap created.  The bladder was bluntly and sharply created and dissected off the anterior lower uterine segment.  A bladder blade as placed behind the bladder.  The anterior cul-de-sac was then atraumatically entered and a Deaver placed behind this to protect the bladder.  Successive bites were hen carried up the body of the uterus hugging closely to the uterine body until the  staple line was met.  All vascular pedicles were sharply created and suture ligated with 0 Vicryl suture.  Eventually the staple line was met and the uterus dissected free.  Attention was then turned to closure.  Good hemostasis was noted from the operative areas.  Next, the cul-de-sac was obliterated by transfixing the uterosacral ligaments in the midline and this was done with a transfixing suture of 0 Vicryl and plicating the uterosacral ligaments in the midline.  The existing ies on the uterosacral ligaments distal to where we placed the previous suture were  then tied together and the vaginal cuff suspended from the previously placed uterosacral ties.  The vaginal cuff was then closed in an anterior to posterior  fashion.  This was done with multiple figure-of-eight sutures of 0 Vicryl. Good hemostasis was noted and good anatomical reapproximation was met.  This was done completely.  A Foley catheter was in place with clear urine obtained.  All vaginal instruments were then removed after completion of the vaginal cuff closure.  Once again, the abdominal incisions were used and the pneumoperitoneum  was recreated.  This was done once again with carbon dioxide gas.  The operative areas were then inspected with the laparoscope and good hemostasis was noted.  All abdominal instruments were removed and the gas allowed to escape from the abdomen.  The small incisions were then closed, first the infraumbilical site with two interrupted sutures of 0 Vicryl on the muscle fascia.  The skin was reapproximated with multiple interrupted sutures of 3-0 Vicryl Rapide on the umbilical and suprapubic sites.  The surgery went well.  The patient was then awakened, extubated, and taken to he recovery room in good condition.  There were no perioperative complications. Final sponge, needle, and instrument counts were  correct x 3. DD:  07/07/99 TD:  07/09/99 Job: [id number]

## 2010-06-28 NOTE — Op Note (Signed)
Tasha Nichols, Tasha Nichols                ACCOUNT NO.:  [id number]   MEDICAL RECORD NO.:  [id number]          PATIENT TYPE:  INP   LOCATION:  5014                         FACILITY:  [facility]   PHYSICIAN:  Feliberto Gottron. Turner Daniels, M.D.   DATE OF BIRTH:  April 29, 1965   DATE OF PROCEDURE:  11/08/2003  DATE OF DISCHARGE:  11/13/2003                                 OPERATIVE REPORT   PREOPERATIVE DIAGNOSIS:  Recurrent dislocation right total hip.   POSTOPERATIVE DIAGNOSIS:  Recurrent dislocation right total hip.   PROCEDURE:  Closed reduction and application of abduction pillow.   ANESTHESIA:  General mask.   ESTIMATED BLOOD LOSS:  None.   FLUIDS REPLACED:  400 mL of Crystalloid.   SURGEON:  Feliberto Gottron. Turner Daniels, M.D.   FIRST ASSISTANT:  None.   INDICATIONS FOR PROCEDURE:  A 45 year old woman who underwent closed  reduction of a right posterior hip dislocation earlier that day around eight  or nine in the morning and sometime in the recovery room, managed to  redislocate the hip.  Because of this, she was taken back to the operating  room for a closed reduction and application of an abduction pillow.   DESCRIPTION OF PROCEDURE:  The patient identified by arm band, taken to the  operating room at [facility].  Appropriate anesthetic  monitors were attached and general mask anesthesia induced with the patient  in supine position.  Traction, adduction, internal rotation and flexion  applied to the right total hip and a satisfying pop felt as the hip once  again reduced confirmed by x-ray.  At this point, I went ahead and checked  for stability.  She was stable to a 90 of flexion, 30 of internal rotation  and could not be dislocated in external rotation.  Abduction pillow was then  applied.  The patient was awakened and taken to the recovery room without  difficulty.      [name]  D:  02/07/2004  T:  02/07/2004  Job:  [id number]

## 2010-06-28 NOTE — Op Note (Signed)
NAME:  Tasha Nichols, Tasha Nichols                          ACCOUNT NO.:  [id number]   MEDICAL RECORD NO.:  [id number]                   PATIENT TYPE:  INP   LOCATION:  NA                                   FACILITY:  [facility]   PHYSICIAN:  Feliberto Gottron. Turner Daniels, M.D.                DATE OF BIRTH:  [birth year]   DATE OF PROCEDURE:  10/23/2003  DATE OF DISCHARGE:                                 OPERATIVE REPORT   PREOPERATIVE DIAGNOSIS:  Degenerative arthritis of right hip with mild  deviation.   POSTOPERATIVE DIAGNOSIS:  Degenerative arthritis of right hip with mild  deviation.   PROCEDURE:  Right total hip arthroplasty using Depuy/SROM components 48  pinnacle one-hole cup NK+0 28 mm Metasul ball 18 x 13 x 16 x 150 stem, 18D  small cone.   SURGEON:  Feliberto Gottron. Turner Daniels, M.D.   FIRST ASSISTANT:  Harvie Junior, M.D.   SECOND ASSISTANT:  __________.   ANESTHETIC:  General endotracheal.   ESTIMATED BLOOD LOSS:  300 mL.   FLUID REPLACEMENT:  Crystalloid 1 liter.   DRAINS PLACED:  None.   TOURNIQUET TIME:  None.   INDICATIONS FOR PROCEDURE:  45 year-old woman with arthroscopy-proven end-  stage arthritis of her right hip which also has a history of mild DDH.  She  has a shallow acetabulum, some bare bone arthritic changes on arthroscopy  and we did remove some torn cartilage and loose bodies, but again the main  problem is the degenerative wear interhip documented arthroscopy.  She has  had continued persistent pain and desires elective hip replacement.  Risks  and benefits of surgery well understood by the patient.   DESCRIPTION OF PROCEDURE:  Patient identified by arm band and taken to the  operating room at [facility].  Appropriate anesthetic monitors were  attached and general endotracheal anesthesia induced with the patient in the  supine position.  She was then rolled into the left lateral decubitus  position, fixed there with a Stulberg Mark II pelvic clamp after insertion  of a  Foley catheter.  The right lower extremity was then prepped and draped  in the usual sterile fashion from the ankle to the hemipelvis.  Skin along  the lateral hip and thigh infiltrated with 20 mL of 0.5% Marcaine and  epinephrine solution.  Began the procedure by making a 12 cm incision  centered over the greater trochanter, allowing a posterolateral approach to  the hip joint.  Small bleeders in the skin and subcutaneous tissue were  identified and cauterized.  The IT band was cut in line with the skin  incision exposing the greater trochanter.  A Hohmann retractor was placed  between the gluteus minimus and the superior hip joint capsule superiorly  and a cobra retractor between the quadratus femoris and the inferior hip  joint capsule. This exposed the piriformis and the short external rotators  which  were then cut off during insertion on the intertrochanteric crest,  exposing the posterior aspect of the hip joint capsule, develops into an  acetabular based flap and likewise tagged with two #2 Ethibond sutures.  Partial release of the rectus femoris was then accomplished for the  electrocautery.  Hip was flexed and internally rotated, dislocating the  arthritic femoral head which was down to bare bone and there was one small  joint mouse found as well.  A standard neck cut performed one finger breadth  above the lesser trochanter, removing the femoral head.  We then  translocated the proximal femur anteriorly with the Hohmann retractor.  After successfully anteriorly translating the femur, posterior superior and  posterior inferior wing retractors were then placed and the hypertrophic  labrum removed.  We then reamed up to a #47 hemispherical acetabular reamer,  making sure to go medial to compensate for the DDH and obtained good cut in  all quadrants.  A 48 mm one-hole pinnacle cup was then hammered into place  and a trial placed.  The hip was then flexed and internally rotated,   exposing the proximal femur which was then cylindrically reamed up to a 13.5  reamer, obtaining good shatter, conically reamed up to an 18 cone and then  the calcar was milled to an 18D small calcar.  Trials were then performed  with an 18D small trial cone, 13 trial stem, 42 neck and a +0 28 mm ball in  the same version as the neck.  The hip was reduced, excellent stability was  noted to 90 of flexion, 70 of internal rotation, external rotation was to 30  and the patient could not be dislocated.  Shuck test was negative and the  patient could flex her knee to 130 degrees with the hip in full extension.  At this point, the trial components were removed, a central occluder was  screwed into the shell followed by a 28 Metasul liner.  On the femoral side  we then hammered into place a 28D small ZTT I cone followed by an 18 x 13 x  42 x 150 stem in the same version as the neck, and a 28 mm +0 Metasul ball  is hammered onto the stem and the hip reduced, excellent stability noted.  The wound was thoroughly irrigated out with normal saline solution.  The  acetabular-based flap and short external rotators __________ back to the  intertrochanteric crest through drill holes, the IT band closed with running  #1 Vicryl suture, the subcutaneous tissue with 0 and 2-0 undyed Vicryl  suture and the skin with running interlocking 3-0 nylon suture.  A dressing  of Xeroform, 4 x 4 dressings, sponges and Hypafix tape applied.  The patient  was then laid supine, awakened and taken to the recovery room without  difficulty.                                               Feliberto Gottron. Turner Daniels, M.D.    Ovid Curd  D:  10/23/2003  T:  10/23/2003  Job:  [id number]

## 2010-06-28 NOTE — H&P (Signed)
   NAME:  Tasha Nichols, Tasha Nichols                          ACCOUNT NO.:  [id number]   MEDICAL RECORD NO.:  [id number]                   PATIENT TYPE:  AMB   LOCATION:  SDC                                  FACILITY:  [facility]   PHYSICIAN:  Tracie Harrier, M.D.              DATE OF BIRTH:  Apr 15, 1965   DATE OF ADMISSION:  12/31/2001  DATE OF DISCHARGE:                                HISTORY & PHYSICAL   HISTORY OF PRESENT ILLNESS:  The patient is a 45 year old female gravida 1,  para 1 status post LAVH in the past.  She was admitted for laparoscopy and  possible left oophorectomy for pelvic pain both acute and chronic.  The  patient underwent LAVH for pelvic pain and abnormal uterine bleeding in  2001.  She has been having recurrent left pelvic pain and is now undergoing  laparoscopy and possible left oophorectomy.   PAST MEDICAL HISTORY:  1. History of hypertension.  2. History of tobacco abuse.  3. History of hypothyroidism, stable.  4. History of pelvic pain.   PAST SURGICAL HISTORY:  1. Cesarean section.  2. Scar revision.  3. LAVH.   OB HISTORY:  Cesarean section 1994 for severe preeclampsia.  Female weighing  2 pounds 6 ounces.   CURRENT MEDICATIONS:  Maxzide and Celebrex.   ALLERGIES:  PENICILLIN.   PHYSICAL EXAMINATION:  VITAL SIGNS:  Stable.  Temperature 97.1, pulse 88,  respirations 16, blood pressure 117/83.  GENERAL:  She is a well-developed, well-nourished female in no acute  distress.  HEENT:  Within normal limits.  NECK:  Supple without adenopathy or thyromegaly.  HEART:  Regular rate and rhythm without murmur, rub, or gallop.  LUNGS:  Clear to auscultation.  BREASTS:  Deferred.  ABDOMEN:  Benign without masses, tenderness, hernia, or organomegaly.  EXTREMITIES:  Grossly normal.  NEUROLOGIC:  Grossly normal.  PELVIC:  Normal external female genitalia.  Vagina is clear.  The uterus is  surgically absent.  The left adnexa is tender without mass.  The right  adnexa is  clear.   ADMITTING DIAGNOSES:  Pelvic pain.   PLAN:  1. Laparoscopy.  2. Possible laparoscopic oophorectomy.   DISCUSSION:  The risks and benefits of this procedure discussed with the  patient.  The risk of bleeding, infection, risk of injury to surrounding  organs was reviewed.  Questions were answered.                                               Tracie Harrier, M.D.    REG/MEDQ  D:  12/31/2001  T:  12/31/2001  Job:  [id number]

## 2010-06-28 NOTE — Discharge Summary (Signed)
NAMEMARQUERITE, FORSMAN                ACCOUNT NO.:  [id number]   MEDICAL RECORD NO.:  [id number]          PATIENT TYPE:  INP   LOCATION:  1603                         FACILITY:  [facility]   PHYSICIAN:  Madlyn Frankel. Charlann Boxer, M.D.  DATE OF BIRTH:  Oct 15, 1965   DATE OF ADMISSION:  03/28/2008  DATE OF DISCHARGE:  03/31/2008                               DISCHARGE SUMMARY   ADMISSION DIAGNOSES:  1. Osteoarthritis.  2. Anxiety.  3. Hypertension.  4. Reflux disease.  5. Hypothyroidism.  6. Degenerative disk disease.  7. Menopause.   DISCHARGE DIAGNOSES:  1. Osteoarthritis.  2. Anxiety.  3. Hypertension.  4. Reflux disease.  5. Hypothyroidism.  6. Degenerative disk disease.  7. Menopause.  8. Postoperative hypokalemia corrected.   HISTORY OF PRESENT ILLNESS:  A 45 year old female with a history of  right total hip replacement with multiple revisions, surgeries and  dislocation of a constrained liner.  She was admitted to the hospital  for revision of right total hip replacement.   CONSULTANTS:  None.   PROCEDURE:  1. Revision right total hip replacement by surgeon Dr. Durene Romans,      assistant Dwyane Luo PA-C.  2. Open reduction and internal fixation of a right femur fracture,      distal periprosthetic femur fracture.  Surgeon was Dr. Durene Romans.  Assistant [employer] PA-C.   LABORATORY DATA:  CBC:  White blood count 10.3, hemoglobin 10.4,  hematocrit 30.6, platelets 177,000.  White cell differential all within  normal limits.  Coagulation normal.  Metabolic panel final reading:  Sodium 137, potassium 3.9, BUN 2, creatinine 0.79, glucose 121.  Calcium  7.8.  UA was negative.   Cardiology:  EKG normal sinus rhythm.   Radiology:  Multiple x-rays taken.  Pelvis AP view showed fracture of  right femoral shaft at the site of the tip of the femoral prosthetic  component.  Postoperatively right femur showed bone stressor, cerclage  wire fixation of the previously demonstrated  admission a right femur  fracture with anatomic position and alignment in the lateral projection.   HOSPITAL COURSE:  The patient was admitted to the hospital, underwent a  revision right total hip replacement.  In end the PACU, the pelvis  demonstrated a periprosthetic distal femur fracture.  She was brought  back to the operating room workup reduction internal fixation was  performed.  She was then transferred  to the orthopedic floor.  He has  thought she made moderate progress during the course of stay.  When seen  on the 17th ,she was her arousable.  Hemodynamically, she had some low  potassium, otherwise she was stable.  We replenished her potassium.  Seen on AP, she did have difficulty moving the knee.  She was sore and  had pain with transfers.  Dressing was changed.  There was no active  drainage from the wound.  She was neurovascularly intact.  We did have  her  touchdown weightbearing right lower extremity.  We locked off her  IV.  Was seen on the 19there.  She was  stable.  No events.  Still  continued be sore with no significant complaints, afebrile.  Dressing  was changed and she was ready for discharge home with home health care  PT.   DISCHARGE DISPOSITION:  Discharged home in stable, improved condition  with home health care PT.   DISCHARGE PHY6SICAL THERAPY:  She is touchdown weightbearing of right  lower extremity.   DIET:  Heart-healthy.   WOUND CARE:  Keep dry.   DISCHARGE MEDICATIONS:  1. Lovenox 40 mg subcu q. 24 h for 10 days.  2. Start enteric-coated aspirin 325 mg one p.o. b.i.d. for 4 weeks.  3. Robaxin 500 mg p.o. q.6h.  4. Iron 325 mg p.o. t.i.d.  5. Colace 100 mg p.o. b.i.d.  6. MiraLax 17 grams p.o. daily.  7. Tylenol 975 mg p.o. q. 6.  8. Continue home pain medications.  9. Pain pump in the right upper abdomen with Dilaudid managed by      [facility], Dr. Alfonse Flavors.  10.  OxyContin 20 mg      one p.o. t.i.d. p.r.n.   10.Oxycodone 30 mg 1 tablet three to four times a day p.r.n.  11.Skelaxin 800 mg p.o. t.i.d. p.r.n.  12.Levothyroxine 100 mcg p.o. q.a.m.  13.Triamterine/HCTZ 37.5/25 one p.o. q.a.m.   DISCHARGE FOLLOWUP:  Follow with Dr. Charlann Boxer at phone number [phone] in 2  weeks for wound check.     ______________________________  Yetta Glassman. [address], [place]      Madlyn Frankel. Charlann Boxer, M.D.  Electronically Signed    BLM/MEDQ  D:  04/21/2008  T:  04/21/2008  Job:  [id number]   cc:   Aida Puffer  Fax: [phone]   Eloisa Northern  Fax: [phone]

## 2010-06-28 NOTE — Discharge Summary (Signed)
Tasha Nichols, Tasha Nichols                ACCOUNT NO.:  [id number]   MEDICAL RECORD NO.:  [id number]          PATIENT TYPE:  INP   LOCATION:  5014                         FACILITY:  [facility]   PHYSICIAN:  Feliberto Gottron. Turner Daniels, M.D.   DATE OF BIRTH:  06/12/1965   DATE OF ADMISSION:  10/23/2003  DATE OF DISCHARGE:  10/25/2003                                 DISCHARGE SUMMARY   DISCHARGE DIAGNOSIS:  End-stage degenerative joint disease of the right hip.   PROCEDURE:  Right total hip arthroplasty.   HISTORY OF PRESENT ILLNESS:  The patient is a 45 year old woman with  arthroscopy proven end-stage arthritis of the right hip which also has a  history of mild DDH.  She has a shallow acetabulum, some bare bone arthritic  changes on arthroscopy with removal of torn cartilage and loose bodies, but  again, the main problem is degenerative wear in her hip documented  arthroscopically.  She has had persistent pain and desires elective hip  placement.  Risks and benefits of surgery were well understood by the  patient.   ALLERGIES:  PENICILLIN and MORPHINE.   CURRENT MEDICATIONS:  Triamterene/hydrochlorothiazide, Synthroid and Tylenol  No. 3.   PAST MEDICAL HISTORY:  1.  Usual childhood diseases.  2.  Adult history of hypertension.  3.  Hyperthyroidism.  4.  DJD.   PAST SURGICAL HISTORY:  1.  C-section in 1994.  2.  Hysterectomy in 2001.  3.  Right hip scope in 2005.   SOCIAL HISTORY:  Positive tobacco with 1/2 pack per day x20 years.  Positive  ethanol occasionally.  Negative IV drug abuse.  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   FAMILY HISTORY:  Mother is alive at age 8 with history of CAD.  Father died  at 69 from history of diabetes and CVA.   REVIEW OF SYSTEMS:  HEENT:  No glasses or dentures.  CARDIOPULMONARY:  She  denies any recent chest pain or shortness of breath.   PHYSICAL EXAMINATION:  VITAL SIGNS:  Temperature 98.1, pulse 56,  respirations 18, blood pressure 130/80.   Height 5 feet 4 inches, weight 152  pounds.  HEENT:  Normocephalic, atraumatic.  Extraocular movements intact.  Pupils  equal round and reactive to light.  Throat benign.  NECK:  Supple with full range of motion.  CHEST:  Clear to auscultation and percussion.  HEART:  Regular rate and rhythm.  ABDOMEN:  Soft and nontender.  EXTREMITIES:  Right hip positive pain to both internal and external  rotation.  NEUROLOGIC:  Intact.  SKIN:  Well-healed scar from arthroscopy.   LABORATORY DATA AND X-RAY FINDINGS:  X-rays show end-stage DJD of the right  hip with changes on MRI and scope.   Preoperative labs including CBC, CMET, chest x-ray, EKG, PT and PTT were all  within normal limits with the exception of WBC of 12.7, hemoglobin 16.4.  Potassium 3.4, glucose 69, BUN 5.   HOSPITAL COURSE:  On the day of admission, the patient was taken for a right  total hip arthroplasty at [facility] with DePuy  SROM components  used, a 48 pedicle one-hole cup, 28 mm ball, 18 x 13 x 16 cone.  The patient  was placed on antibiotics.  She was placed on postoperative Coumadin  prophylaxis with target INR of 1.5-2.0.  She was placed on postoperative  Dilaudid for pain control.  Physical therapy was begun immediately  postoperatively.  Foley catheter was also placed postoperatively.   On postop day #1, the patient was complaining of nausea with one bout of  emesis and moderate pain.  T-max was 99.3.  Vital signs were stable.  Thigh  showed moderate edema.  Hemoglobin was 11.9.  INR was 1.2.  Dressing was  dry.  The patient was neurovascularly intact to light touch and motor and  she was otherwise stable.  She began physical therapy in earnest.  On postop  day #2, the patient was without complaint with no nausea or vomiting.  She  was eating and drinking well making good progress with physical therapy.  She was afebrile and vital signs stable.  Hemoglobin was 12.5.  WBC 13.2 and  INR 1.3.  Dressing was  dry and wound was benign.  Calf and thigh were both  soft and she was otherwise stable.  She had made steady progress in physical  therapy the day before.  She was allowed to go home once her physical  therapy goals were met after additional PT on that postop day.  At the time  of her transfer, she was transferring and ambulating with total hip  precautions safely.   DIET:  Regular.   DISCHARGE MEDICATIONS:  Percocet and Coumadin x2 weeks postoperatively with  [facility] monitoring her Coumadin prophylaxis.   WOUND CARE:  Dressing changes q.d.   FOLLOW UP:  Return to clinic in approximately 1 week's time, sooner if she  should have any increase in drainage or any type of fever over 101.   SPECIAL INSTRUCTIONS:  We caution her once again not to smoke.   ACTIVITY:  Weightbearing as tolerated with hip precautions.  She will  continue with home health PT and R.N. for blood draws.      Tasha Nichols  D:  11/22/2003  T:  11/22/2003  Job:  [id number]

## 2010-06-28 NOTE — Op Note (Signed)
NAMECRYSTA, Tasha Nichols                ACCOUNT NO.:  [id number]   MEDICAL RECORD NO.:  [id number]          PATIENT TYPE:  INP   LOCATION:  5029                         FACILITY:  [facility]   PHYSICIAN:  Feliberto Gottron. Turner Daniels, M.D.   DATE OF BIRTH:  24-Sep-1965   DATE OF PROCEDURE:  11/03/2003  DATE OF DISCHARGE:  11/04/2003                                 OPERATIVE REPORT   PREOPERATIVE DIAGNOSIS:  Recurrent dislocation, right total hip.   POSTOPERATIVE DIAGNOSIS:  Recurrent dislocation, right total hip.   OPERATION PERFORMED:  Closed reduction under general anesthetic and testing  for stability.   SURGEON:  Feliberto Gottron. Turner Daniels, M.D.   ASSISTANT:  None.   ANESTHESIA:  General mask.   ESTIMATED BLOOD LOSS:  Minimal.   FLUIDS REPLACED:  300 mL of crystalloid.   INDICATIONS FOR PROCEDURE:  The patient is a 44 year old woman who underwent  a primary metal on metal right total hip on October 23, 2003, did well in  the hospital and was discharged home.  Two days ago she was sitting in a  car, had the sudden onset of pain in her hip, called [place].  We told her to  come in.  She didn't come in for 48 hours and when she did, she had a  dislocated right total hip that underwent closed reduction yesterday  evening.  She went home, did well until she was getting out of a standard  regular chair, not a high chair, but a standard chair, leaned forward to get  up and then popped her hip out again.  This was early this morning.  I met  her at [facility].  Plan was to take her upstairs to  the operating room for a closed reduction.   DESCRIPTION OF PROCEDURE:  The patient was identified by arm band and taken  to the operating room at [facility] main hospital where appropriate anesthetic  monitors were attached and general mask anesthesia induced and using a  combination of traction, adduction and internal rotation, I was able to get  the hip back into joint and documented this on C-arm imaging.   We then took  her through a range of motion flexing her to 90 degrees, internally and  externally, rotating 30 degrees each with neutral abduction and she was  stable.  She did start to lever at 40 of internal rotation but these were  fairly normal findings for a total hip.  In addition, on C-arm imaging, the  cup was noted to be at 45 degrees of abduction and about 20 degrees to 30  degrees of anteversion.  After closed reduction, she was placed in an  abduction pillow, awakened and taken to the recovery room without  difficulty.     FJR/MEDQ  D:  11/03/2003  T:  11/04/2003  Job:  [id number]

## 2010-06-28 NOTE — Op Note (Signed)
Tasha Nichols, Tasha Nichols                ACCOUNT NO.:  [id number]   MEDICAL RECORD NO.:  [id number]          PATIENT TYPE:  INP   LOCATION:  2550                         FACILITY:  [facility]   PHYSICIAN:  Feliberto Gottron. Turner Daniels, M.D.   DATE OF BIRTH:  1965-08-17   DATE OF PROCEDURE:  12/18/2003  DATE OF DISCHARGE:                                 OPERATIVE REPORT   PREOPERATIVE DIAGNOSIS:  Chronic dislocations, right total hip arthroplasty.   POSTOPERATIVE DIAGNOSIS:  Chronic dislocations, right total hip  arthroplasty.   OPERATION PERFORMED:  Revision of right total hip arthroplasty with removal  of the standard acetabular component and revision to a constrained Depuy 48  mm acetabular component, removal of the NK+0 28 mm femoral head and revision  to an MK+0 28 mm femoral head.   SURGEON:  Feliberto Gottron. Turner Daniels, M.D.   ASSISTANTLaural Benes. Jannet Mantis.   ANESTHESIA:  General endotracheal.   ESTIMATED BLOOD LOSS:  100 mL.   FLUIDS REPLACED:  700 mL crystalloid.   URINE OUTPUT:  200 mL.   DRAINS PLACED:  Foley catheter.   INDICATIONS FOR PROCEDURE:  The patient is a 45 year old woman who underwent  a primary right total hip replacement about six weeks ago.  About a week  after the surgery had been accomplished, she dislocated for the first time,  probably posteriorly at home.  Actually she stayed at home for a couple of  days with the hip dislocated and finally came to our office she said because  of transportation problems.  The hip was reduced.  She was sent back home,  popped out again and was placed in a hinge abduction brace a week later.  Unfortunately, she then popped out again in the brace and once more in the  recovery room after that relocation, so she had been out four times.  Standard radiographs of the total hip after the surgery and about a week  later showed the components to be in good position, 45 degrees of abduction  for the socket and about 20 degrees of anteversion on the  shoot through  lateral.  The stem was also in about 20 degrees of anteversion, so  geometrically, it was a stable metal on metal ultimate Depuy hip.  In  addition, when we reduced her, we also noted it was stable.  Although she  does not have any muscular diseases I guess one concern would be for  possible lax musculature allowing her to dislocate but the bottom line was  usually when she went from sitting to standing and her hip would pop out of  joint.  It was a geometrically stable hip and for whatever reason she could  not keep it in.  Because of this, we counseled her about the possibility of  a constrained liner.  The hope would be that constrained liner would be  fabricated and then when it came in, the ingrowth into the acetabular  component will be good enough so that the constrained liner could transfer  some of the stress of her positioning to the bone metal  interface which  would be stable by then.  The risks and benefits of surgery were well  understood by the patient.  She understands that this is essentially an  experimental cup, not many are done but as long as she did not push it past  the limits this could take care of the dislocation problems.  She is well  aware of the risks and benefits of surgery and desires to have revision to a  constrained liner.   DESCRIPTION OF PROCEDURE:  The patient was identified by arm band and taken  to the operating room at [facility] main hospital where appropriate anesthetic  monitors were attached and general endotracheal anesthesia induced with the  patient in the supine position.  The patient was then rolled into the left  lateral decubitus position and held there with a Stulberg Mark 2 pelvic  clamp.  The right lower extremity prepped and draped in the usual sterile  fashion from the ankle to the hemi pelvis and we utilized the old skin  incision about 14 cm in length centered over the greater trochanter, through  the skin and subcutaneous  tissue down to the level of the iliotibial band.  The iliotibial band was then split in line with the skin incision exposing  the greater trochanter.  We did encounter some clear joint fluid which was  sent off for Gram stain and culture.  The Gram stain came back occasional  monos, no organisms.  We then removed soft tissue from around the acetabular  component.  The hip was noted to flex to 90 with 30 of internal rotation  before there was instability and could not be dislocated in extension.  We  then dislocated the total hip, removed  the femoral head with did have a  scratch on it from where it went over the metal liner and then tucked the  proximal femur up into the superior anterior super acetabular region  levering anteriorly with a Hohmann retractor. This allowed [place] to visualize  the acetabular component which was noted to be in good version and  abduction.  Utilizing the suction cup and a bone tamp, we then vibrated the  cup and broke the Morse taper lock on the ultimate liner which was removed.  We then carefully removed soft tissue from around the acetabular components  to make way for the constrained liner which was opened on the back table and  inserted with a 10 degree index mark posterior and superior.  We then  carefully went around the interface between the plastic liner and the metal  cup to confirm that.  The liner was locked in place. We then hammered an  NK+0 28 mm ball back onto the stem, reduced it into the flower petal of the  constrained liner with the ring on the neck for the reduction.  After the  reduction the ring beveled down, was snapped over the flower petal portion  of the constrained liner and the hip was then taken through a range of  motion that confirmed that the liner was well fixed and locked in place.  We  brought it up against the stops anteriorly, posteriorly and superiorly to confirm that the liner was well fixed and good motion was noted.  At  this  point was thoroughly irrigated with normal saline solution.  The  pseudocapsule posteriorly was repaired back to the intertrochanteric crest  through drill holes using #2 Ethibond suture.  The iliotibial band was  closed with  running #1 Vicryl suture.  The subcutaneous tissue with 0 and 2-  0 undyed Vicryl suture and the skin with running interlocking 3-0 nylon  suture.  A dressing of Xeroform, 4 x 4 dressing sponges, and HypaFix tape  was applied.  The patient was unclamped, rolled supine, awakened and taken  to the recovery room without difficulty.      [name]  D:  12/18/2003  T:  12/18/2003  Job:  [id number]

## 2010-06-28 NOTE — Discharge Summary (Signed)
[facility]  Patient:    Tasha Nichols, Tasha Nichols                       MRN: [id number] Adm. Date:  [id number] Disc. Date: [id number] Attending:  Donne Hazel                           Discharge Summary  HISTORY OF PRESENT ILLNESS:   Ms. Turvey is a 45 year old married female, gravida 1, para 1, admitted for definitive therapy of abnormal uterine bleeding and pelvic pain.  The patient was thoroughly counseled regarding different treatment options and requested a permanent and definitive care. She is admitted to undergo laparoscopic-assisted vaginal hysterectomy with ovarian conservation.  PAST MEDICAL HISTORY:         1. History of hypertension.                               2. History of tobacco usage.                               3. History of hypothyroidism.                               4. History of pelvic pain.  SURGICAL HISTORY:             1. Cesarean section x 1.                               2. History of cesarean section scar revision.  OBSTETRICAL HISTORY:          A primary cesarean section for severe preeclampsia, a female weighing 2 pounds 6 ounces, 1994.  CURRENT MEDICATIONS:          Maxzide and Serzone.  ALLERGIES:                    PENICILLIN.  PHYSICAL EXAMINATION:         Please see clinic admission history and physical.  ADMISSION DIAGNOSES:          1. Chronic pelvic pain.                               2. Abnormal uterine bleeding.                               3. Chronic hypertension.                               4. Smoker.  HOSPITAL COURSE:              The patient was admitted on the same day of surgery, Jun 27, 1999, where she underwent a laparoscopic-assisted vaginal hysterectomy.  Pelvic adhesions were encountered at time of surgery and these were normalized.  The laparoscopic-assisted vaginal hysterectomy went uneventfully.  Blood loss was 150 cc.  Patients postoperative course was uneventful.  She was advanced  to a clear liquid diet.  Her hemoglobin stabilized at 12.1.  Her catheter was removed on the morning of  May 18th; she quickly resumed normal bladder function.  She had positive flatus.  She requested discharge postoperative day #1, her exam was normal and she was doing well; therefore, she was discharged postoperative day #1 in stable condition.  She was given postoperative instructions prior to discharge.  DISCHARGE DIAGNOSES:          1. History of pelvic pain and abnormal uterine                                  bleeding, now status post                                  laparoscopic-assisted vaginal hysterectomy.                               2. Uneventful hospital and postoperative course.  PLAN:                         1. Discharge home.                               2. Tylox, #50.                               3. Routine postoperative instructions given in                                  length and in detail.  The patient was                                  allowed to ask questions.                               4. She will follow up in one week for a routine                                  postoperative checkup.                               5. She will call if any problems should arise. DD:  07/07/99 TD:  07/08/99 Job: [id number]

## 2010-06-28 NOTE — H&P (Signed)
[facility]  Patient:    Tasha Nichols, Tasha Nichols                       MRN: [id number] Adm. Date:  [id number] Attending:  Donne Hazel                         History and Physical  HISTORY OF PRESENT ILLNESS:   Ms. Depaoli is a 45 year old married white female gravida 1 para 1, presents today for definitive therapy of abnormal uterine bleeding and pelvic pain.  The patient has had chronic pelvic pain, unresponsive to conservative measures.  She has also had abnormal uterine bleeding where she has had periods of twice per month.  Her pelvic pain has been constant.  She is now admitted to undergo laparoscopically-assisted vaginal hysterectomy with ovarian conservation.  She is a smoker.  She declined any conservative measures for treatment of this.  MEDICAL HISTORY:              1. History of hypertension.  2. History of tobacco abuse.  3. History of hypothyroidism.  4. History of pelvic pain.  SURGICAL HISTORY:             1. Cesarean section x 1.  2. Scar revision.  OBSTETRICAL HISTORY:          Cesarean section in 1994 for severe preeclampsia.  A female weighing 2 pounds 6 ounces.  CURRENT MEDICATIONS:          Maxzide and Serzone.  ALLERGIES:                    PENICILLIN.  PHYSICAL EXAMINATION:  VITAL SIGNS:                  Stable, blood pressure 138/91, temperature 98, pulse 98, respirations 20.  GENERAL:                      She is a well-developed, well-nourished female in no acute distress.  HEENT:                        Within normal limits.  NECK:                         Supple without adenopathy or thyromegaly.  HEART:                        Regular rate and rhythm without murmur, gallop, or rub.  BREAST:                       Done recently in the office was normal but is deferred upon admission.  ABDOMEN:                      Soft, benign, without masses, tenderness, or organomegaly.  EXTREMITIES AND NEUROLOGIC:   Grossly  normal.  PELVIC:                       Normal external female genitalia, vagina and cervix clear.  The uterus is small, nontender, and mobile.  The left adnexa is tender without mass.  The right adnexa is clear without masses or tenderness.  ADMITTING DIAGNOSES:          1. Chronic pelvic pain.  2. Abnormal uterine bleeding.                               3. Chronic hypertension.                               4. Smoker.  PLAN:                         Laparoscopically-assisted vaginal hysterectomy.  DISCUSSION:                   Risks and benefits of surgery explained to patient.  The risk of bleeding, infection, risk of injury to surrounding organs explained.  The patient was allowed to ask questions and wished to proceed with laparoscopically-assisted vaginal hysterectomy with ovarian conservation.  This was discussed with her at length. DD:  06/27/99 TD:  06/27/99 Job: [id number]

## 2010-06-28 NOTE — Op Note (Signed)
NAMEINDIA, Tasha Nichols                ACCOUNT NO.:  [id number]   MEDICAL RECORD NO.:  [id number]          PATIENT TYPE:  INP   LOCATION:  5014                         FACILITY:  [facility]   PHYSICIAN:  Feliberto Gottron. Turner Daniels, M.D.   DATE OF BIRTH:  12-Feb-1965   DATE OF PROCEDURE:  11/08/2003  DATE OF DISCHARGE:  11/13/2003                                 OPERATIVE REPORT   PREOPERATIVE DIAGNOSIS:  Recurrent dislocation of right total hip.   ANESTHETIC:  General mask.   ESTIMATED BLOOD LOSS:  None.   FLUIDS REPLACED:  Around 400 mL.   COMPLICATIONS:  None.   INDICATIONS:  Tasha Nichols is a 45 year old woman who underwent a primary  right total hip on October 18, 2003.  She did well initially after the  surgery, but five days later had her first of multiple dislocations of the  right total hip going from a seated to a standing position.  Plain  radiographs taken at that time showed that the cup abduction was at 45  degrees and the anteversion was 20 as was the stem, so geometrically this  was a stable hip and she simply got it in a bad position, getting out of a  chair.  She underwent a closed reduction and unfortunately dislocated again  at home.  She did not actually come in for a day or two.  We then put her in  a hinge abduction brace.  She was supposed to come into the office on  November 08, 2003, and x-rays taken that day showed that she had dislocated  again.  She denied getting in a malposition, but she did have the hinge  abduction brace on.  She remained geometrically stable.  No fevers, no  chills and no problems with the wound.  She was taken to the operating room  for yet another closed reduction.  This time we had a fairly frank  discussion about the use of a constraining liner.  In any event, at 11:50  a.m. on November 08, 2003, she was taken to the operating room for a closed  reduction.   DESCRIPTION OF PROCEDURE:  The patient was identified by armband and taken  to the  operating room at [facility].  General mask anesthesia  induced after the appropriate anesthetic monitors were attached.  Traction  with flexion to about 90 degrees, internal rotation to 45 and adduction to  about 30 degrees did result in a closed reduction of the right total hip  confirmed by x-ray.  She was placed back in her hinge abduction brace,  awaken and taken to the recovery room.  Unfortunately, a postoperative film  in the recovery room showed that the hip had once again dislocated.  I was  not actually there when they transferred her from the OR table back to the  gurney, but that is probably when the dislocation occurred.  Because of  this, we once again had a discussion about hip position and a few hours  later she was taken back to the operating room, once general mask anesthesia  induced  and she underwent a second closed reduction under general  anesthesia.  Placed back in her hinge abduction brace.  This time I was with  her as they woke her up.  We kept her in a position of slight flexion,  external rotation and  abduction and she remained reduced.  We also discussed the situation with  Jeral Pinch, the [employer] representative, and at this point in time ordered a  constrained liner for Tasha Nichols since geometrically the hip was stable, but  for whatever reason, she was getting in a malposition.      [name]  D:  12/11/2003  T:  12/11/2003  Job:  [id number]

## 2012-05-06 DIAGNOSIS — G894 Chronic pain syndrome: Secondary | ICD-10-CM | POA: Insufficient documentation

## 2012-07-29 DIAGNOSIS — M259 Joint disorder, unspecified: Secondary | ICD-10-CM | POA: Insufficient documentation

## 2013-06-01 DIAGNOSIS — Z9689 Presence of other specified functional implants: Secondary | ICD-10-CM | POA: Insufficient documentation

## 2013-07-11 ENCOUNTER — Ambulatory Visit: Payer: Self-pay | Admitting: Gastroenterology

## 2013-07-11 LAB — HM COLONOSCOPY

## 2014-05-23 LAB — TSH: TSH: 0.33 u[IU]/mL — AB (ref <=5.90)

## 2014-06-27 DIAGNOSIS — Z9689 Presence of other specified functional implants: Secondary | ICD-10-CM | POA: Diagnosis not present

## 2014-06-27 DIAGNOSIS — M25551 Pain in right hip: Secondary | ICD-10-CM | POA: Diagnosis not present

## 2014-06-27 DIAGNOSIS — M129 Arthropathy, unspecified: Secondary | ICD-10-CM | POA: Diagnosis not present

## 2014-06-27 DIAGNOSIS — G894 Chronic pain syndrome: Secondary | ICD-10-CM | POA: Diagnosis not present

## 2014-07-11 ENCOUNTER — Encounter: Payer: Self-pay | Admitting: Physical Therapy

## 2014-07-11 ENCOUNTER — Ambulatory Visit: Payer: BLUE CROSS/BLUE SHIELD | Attending: Adult Health Nurse Practitioner | Admitting: Physical Therapy

## 2014-07-11 DIAGNOSIS — M25551 Pain in right hip: Secondary | ICD-10-CM | POA: Diagnosis not present

## 2014-07-11 DIAGNOSIS — G8929 Other chronic pain: Secondary | ICD-10-CM

## 2014-07-11 NOTE — Therapy (Signed)
[facility] MAIN [facility] SERVICES [address] [place], [place], [address] Phone: [phone]   Fax:  [phone]  Physical Therapy Evaluation  Patient Details  Name: Tasha Nichols MRN: [id number] Date of Birth: [birth year] Referring Provider:  Judie Bonus, *  Encounter Date: 07/11/2014    History reviewed. No pertinent past medical history.  History reviewed. No pertinent past surgical history.  There were no vitals filed for this visit.  Visit Diagnosis:  Hip pain, chronic, right - Plan: PT plan of care cert/re-cert      Subjective Assessment - 07/11/14 0859    Subjective Patient says that her right hip/groin has been pain full since 2006 , the last ten years.    Currently in Pain? Yes   Pain Score 9    Pain Orientation Right   Pain Descriptors / Indicators Constant;Jabbing   Pain Type Chronic pain   Pain Onset Other (comment)   Pain Relieving Factors ice            [facility] PT Assessment - 07/11/14 0001    Assessment   Medical Diagnosis Right hip pain   Onset Date/Surgical Date 06/27/14   Next MD Visit not yet   Prior Therapy lots of therapy over the years   Precautions   Precautions None   Balance Screen   Has the patient fallen in the past 6 months No   Has the patient had a decrease in activity level because of a fear of falling?  Yes   Is the patient reluctant to leave their home because of a fear of falling?  No   Sensation   Light Touch Appears Intact   Functional Tests   Functional tests Sit to Stand   Sit to Stand   Comments 5 x sit to stand 15.59 sec  10 MW  .84 m/sec TUG n 7.90 sec  6 MW  1250       ROM / Strength   AROM / PROM / Strength Strength   Strength   Overall Strength Comments --  R hip flex/ext/IR/ER 3/5, righ knee F/E 4+/5, LLE [facility]   Flexibility   Soft Tissue Assessment /Muscle Length yes   Special Tests    Special Tests --  +Elys R hip                            PT Education - 07/11/14 0903    Education provided Yes   Person(s) Educated Patient   Methods Explanation;Demonstration;Tactile cues   Comprehension Verbalized understanding;Returned demonstration;Verbal cues required             PT Long Term Goals - 07/11/14 0929    PT LONG TERM GOAL #1   Title Patient will be independent with home exercise program for self-management of hip symptoms/ difficulty.   Status New   PT LONG TERM GOAL #2   Title Patient will improve lower extremity demonstrated by completing sit to stand transfers from < 23 inch surface without UE support.   Status New   PT LONG TERM GOAL #3   Title Pt will be I with HEP to increase BLE hip strength to be able to ambulate community distances without hip pain   Status New               Plan - 07/11/14 0933    Clinical Impression Statement Patient is 49 yr old female with C/o righ hip pain that is  constant 9/10. She has had the pain for the last 10 years. Patient has 3/5 right hip strength and decresed 5 x sit to stand, decreased gait speed and decreased 6 MW test. She would benefit from  skilled PT to improve strength and mobility.    Pt will benefit from skilled therapeutic intervention in order to improve on the following deficits Abnormal gait;Difficulty walking;Decreased endurance;Decreased mobility;Decreased strength   Rehab Potential Fair   PT Frequency 2x / week   PT Duration 4 weeks   PT Treatment/Interventions Therapeutic exercise;Therapeutic activities;Stair training;Gait training;Cryotherapy;Electrical Stimulation;Moist Heat;Neuromuscular re-education;Patient/family education;Manual techniques;Dry needling   Consulted and Agree with Plan of Care Patient         Problem List There are no active problems to display for this patient.   Ezekiel InaMansfield, Kristine S 07/11/2014, 9:58 AM  [facility] [address] [place], [place],  [address] Phone: [phone]   Fax:  [phone]

## 2014-07-17 ENCOUNTER — Encounter: Payer: Self-pay | Admitting: Physical Therapy

## 2014-07-17 ENCOUNTER — Ambulatory Visit: Payer: BLUE CROSS/BLUE SHIELD | Attending: Adult Health Nurse Practitioner | Admitting: Physical Therapy

## 2014-07-17 DIAGNOSIS — G8929 Other chronic pain: Secondary | ICD-10-CM | POA: Diagnosis not present

## 2014-07-17 DIAGNOSIS — M25551 Pain in right hip: Secondary | ICD-10-CM | POA: Insufficient documentation

## 2014-07-17 NOTE — Therapy (Signed)
[facility] [address] [place], [place], [address] Phone: [phone]   Fax:  [phone]  Physical Therapy Treatment  Patient Details  Name: Tasha Nichols MRN: [id number] Date of Birth: 02/27/1965 Referring Provider:  Judie BonusPriddy-Southern, Tasha, *  Encounter Date: 07/17/2014      PT End of Session - 07/17/14 0845    Visit Number 2   Number of Visits 9   Date for PT Re-Evaluation 09/04/14   PT Start Time 0837   PT Stop Time 0915   PT Time Calculation (min) 38 min   Activity Tolerance Patient limited by pain   Behavior [facility] Therapy [facility] for tasks assessed/performed      History reviewed. No pertinent past medical history.  History reviewed. No pertinent past surgical history.  There were no vitals filed for this visit.  Visit Diagnosis:  Hip pain, chronic, right      Subjective Assessment - 07/17/14 0843    Subjective Patient conintues to have RLE groin pain.   Pain Score 9    Pain Orientation Right   Pain Descriptors / Indicators Aching   Pain Type Chronic pain       Therapeutic exercise including leg press with 60 lbs x 20 x 3 Heel slides x 20 x 3, hooklying marching x 20 x 2 hooklying ER/abd x 20 x 3 Hip 4 way flex/ext,abd x 20 x 1 Patient has antalgic gait following exercises and tenderness to ASIS                           PT Education - 07/17/14 0845    Education provided Yes   Person(Nichols) Educated Patient   Methods Explanation;Demonstration   Comprehension Verbalized understanding;Returned demonstration;Verbal cues required             PT Long Term Goals - 07/11/14 0929    PT LONG TERM GOAL #1   Title Patient will be independent with home exercise program for self-management of hip symptoms/ difficulty.   Status New   PT LONG TERM GOAL #2   Title Patient will improve lower extremity demonstrated by completing sit to stand transfers from < 23 inch surface without UE  support.   Status New   PT LONG TERM GOAL #3   Title Pt will be I with HEP to increase BLE hip strength to be able to ambulate community distances without hip pain   Status New               Plan - 07/17/14 0847    Clinical Impression Statement Patient has pain in her right groin that extends down to her right knee. She has weakness in her R hip and RLE and will continue to benefit from skilled PT to improve strength and mobiity level.  Patient reports pain increased form 7/10 to 9/10 after and during the exercises.    Pt will benefit from skilled therapeutic intervention in order to improve on the following deficits Abnormal gait;Difficulty walking;Decreased endurance;Decreased mobility;Decreased strength   Rehab Potential Fair   PT Frequency 2x / week   PT Duration 4 weeks   PT Treatment/Interventions Iontophoresis 4mg /ml Dexamethasone   PT Next Visit Plan strengthening of RLE   PT Home Exercise Plan HEP verbally instructed and patient demonstrated.         Problem List There are no active problems to display for this patient.   Tasha BroomMansfield, Tasha Nichols 07/17/2014, 8:51 AM  [place] MAIN [facility] SERVICES [address] [place], [place], [address] Phone: [phone]   Fax:  [phone]

## 2014-07-19 ENCOUNTER — Encounter: Payer: Self-pay | Admitting: Physical Therapy

## 2014-07-19 ENCOUNTER — Ambulatory Visit: Payer: BLUE CROSS/BLUE SHIELD | Attending: Adult Health Nurse Practitioner | Admitting: Physical Therapy

## 2014-07-19 DIAGNOSIS — M25551 Pain in right hip: Secondary | ICD-10-CM | POA: Diagnosis not present

## 2014-07-19 DIAGNOSIS — G8929 Other chronic pain: Secondary | ICD-10-CM | POA: Diagnosis not present

## 2014-07-19 NOTE — Therapy (Signed)
[place] MAIN [facility] SERVICES [address] [place], [place], [address] Phone: [phone]   Fax:  [phone]  Physical Therapy Treatment  Patient Details  Name: Tasha Nichols MRN: [id number] Date of Birth: [birth year] Referring Provider:  Sylvie Farrier, *  Encounter Date: 07/19/2014      PT End of Session - 07/19/14 0856    Visit Number 3   Number of Visits 9   Date for PT Re-Evaluation 09/04/14   PT Start Time 0841      History reviewed. No pertinent past medical history.  History reviewed. No pertinent past surgical history.  There were no vitals filed for this visit.  Visit Diagnosis:  Hip pain, chronic, right      Subjective Assessment - 07/19/14 0855    Subjective Patient continues to have RLE groin pain 7/10.   Pain Score 7    Pain Descriptors / Indicators Aching          Therapeutic exercise including leg press with 60 lbs x 20 x 3 Hip bumps left and right in squat position at the wall Squats with theraball x 10 x 2 Hip 4 way flex/ext,abd x 20 x 1 with YTB Patient has no increased pain today with pain following exercses.  Patient has antalgic gait following exercises and tenderness to ASIS                         PT Education - 07/19/14 0856    Education provided Yes   Person(s) Educated Patient   Methods Explanation;Demonstration   Comprehension Verbalized understanding             PT Long Term Goals - 07/11/14 0929    PT LONG TERM GOAL #1   Title Patient will be independent with home exercise program for self-management of hip symptoms/ difficulty.   Status New   PT LONG TERM GOAL #2   Title Patient will improve lower extremity demonstrated by completing sit to stand transfers from < 23 inch surface without UE support.   Status New   PT LONG TERM GOAL #3   Title Pt will be I with HEP to increase BLE hip strength to be able to ambulate community distances without hip pain    Status New               Plan - 07/19/14 0941    Clinical Impression Statement Patient continues to have 7/10 to right groin and  has been progressed with HEP. She is independent with HEP and will be DCed from therapy to continue at home.    Consulted and Agree with Plan of Care Patient    PHYSICAL THERAPY DISCHARGE SUMMARY  Visits from Start of Care: 2  Current functional level related to goals / functional outcomes: See above   Remaining deficits: Right hip pain, weakness, decreased ambulation distance   Education / Equipment: HEP instruction Plan: Patient agrees to discharge.  Patient goals were partially met. Patient is being discharged due to being pleased with the current functional level.  ?????        Problem List There are no active problems to display for this patient.  Alanson Puls, PT, DPT [place], Minette [place] S 07/19/2014, 9:43 AM  [place] MAIN [facility] SERVICES [address] [place], [place], [address] Phone: [phone]   Fax:  [phone]

## 2014-07-20 DIAGNOSIS — M25551 Pain in right hip: Secondary | ICD-10-CM | POA: Diagnosis not present

## 2014-10-17 ENCOUNTER — Telehealth: Payer: Self-pay | Admitting: Family Medicine

## 2014-10-17 DIAGNOSIS — E785 Hyperlipidemia, unspecified: Secondary | ICD-10-CM | POA: Insufficient documentation

## 2014-10-17 DIAGNOSIS — F329 Major depressive disorder, single episode, unspecified: Secondary | ICD-10-CM | POA: Insufficient documentation

## 2014-10-17 DIAGNOSIS — G8929 Other chronic pain: Secondary | ICD-10-CM | POA: Insufficient documentation

## 2014-10-17 DIAGNOSIS — D751 Secondary polycythemia: Secondary | ICD-10-CM | POA: Insufficient documentation

## 2014-10-17 DIAGNOSIS — K219 Gastro-esophageal reflux disease without esophagitis: Secondary | ICD-10-CM | POA: Insufficient documentation

## 2014-10-17 DIAGNOSIS — F17200 Nicotine dependence, unspecified, uncomplicated: Secondary | ICD-10-CM | POA: Insufficient documentation

## 2014-10-17 DIAGNOSIS — E039 Hypothyroidism, unspecified: Secondary | ICD-10-CM

## 2014-10-17 DIAGNOSIS — G43909 Migraine, unspecified, not intractable, without status migrainosus: Secondary | ICD-10-CM | POA: Insufficient documentation

## 2014-10-17 DIAGNOSIS — F32A Depression, unspecified: Secondary | ICD-10-CM | POA: Insufficient documentation

## 2014-10-17 DIAGNOSIS — M549 Dorsalgia, unspecified: Secondary | ICD-10-CM

## 2014-10-17 DIAGNOSIS — G4700 Insomnia, unspecified: Secondary | ICD-10-CM

## 2014-10-17 DIAGNOSIS — E1169 Type 2 diabetes mellitus with other specified complication: Secondary | ICD-10-CM | POA: Insufficient documentation

## 2014-10-17 DIAGNOSIS — M1990 Unspecified osteoarthritis, unspecified site: Secondary | ICD-10-CM | POA: Insufficient documentation

## 2014-10-17 DIAGNOSIS — I152 Hypertension secondary to endocrine disorders: Secondary | ICD-10-CM | POA: Insufficient documentation

## 2014-10-17 DIAGNOSIS — E1159 Type 2 diabetes mellitus with other circulatory complications: Secondary | ICD-10-CM

## 2014-10-17 DIAGNOSIS — E7800 Pure hypercholesterolemia, unspecified: Secondary | ICD-10-CM

## 2014-10-17 DIAGNOSIS — I10 Essential (primary) hypertension: Secondary | ICD-10-CM

## 2014-10-17 HISTORY — DX: Type 2 diabetes mellitus with other circulatory complications: E11.59

## 2014-10-17 NOTE — Telephone Encounter (Signed)
Please check and make sure she does not need any other labs. When was last ov. Thanks.

## 2014-10-17 NOTE — Telephone Encounter (Signed)
Ok to order all labs, needs CPE scheduled. Thanks.

## 2014-10-17 NOTE — Telephone Encounter (Signed)
Pt would like to pick up a lab slip to have her thyroid checked. Thanks TNP

## 2014-10-17 NOTE — Telephone Encounter (Signed)
Pt's thyroid was overcorrected 05/2014 and her medicine was reduced to .  Her last ov was 03/2014 and it looks like she is due for all of her labs to be checked.   Thanks,   -Vernona Rieger

## 2014-10-17 NOTE — Telephone Encounter (Signed)
Lab slip at the front desk; pt advised.  She is going to call back and scheduled her CPE.  Thanks,   -Vernona Rieger

## 2014-10-19 LAB — LIPID PANEL
CHOL/HDL RATIO: 4.3 ratio (ref 0.0–4.4)
Cholesterol, Total: 177 mg/dL (ref 100–199)
HDL: 41 mg/dL (ref >39)
LDL Calculated: 115 mg/dL — ABNORMAL HIGH (ref 0–99)
Triglycerides: 104 mg/dL (ref 0–149)
VLDL Cholesterol Cal: 21 mg/dL (ref 5–40)

## 2014-10-19 LAB — CBC WITH DIFFERENTIAL/PLATELET
BASOS: 1 %
Basophils Absolute: 0 10*3/uL (ref 0.0–0.2)
EOS (ABSOLUTE): 0.1 10*3/uL (ref 0.0–0.4)
EOS: 2 %
HEMATOCRIT: 47 % — AB (ref 34.0–46.6)
Hemoglobin: 16.2 g/dL — ABNORMAL HIGH (ref 11.1–15.9)
Immature Grans (Abs): 0 10*3/uL (ref 0.0–0.1)
Immature Granulocytes: 0 %
LYMPHS ABS: 2.1 10*3/uL (ref 0.7–3.1)
Lymphs: 27 %
MCH: 30.7 pg (ref 26.6–33.0)
MCHC: 34.5 g/dL (ref 31.5–35.7)
MCV: 89 fL (ref 79–97)
MONOS ABS: 0.5 10*3/uL (ref 0.1–0.9)
Monocytes: 6 %
Neutrophils Absolute: 5 10*3/uL (ref 1.4–7.0)
Neutrophils: 64 %
Platelets: 239 10*3/uL (ref 150–379)
RBC: 5.27 x10E6/uL (ref 3.77–5.28)
RDW: 14.1 % (ref 12.3–15.4)
WBC: 7.7 10*3/uL (ref 3.4–10.8)

## 2014-10-19 LAB — COMPREHENSIVE METABOLIC PANEL
ALT: 16 IU/L (ref 0–32)
AST: 16 IU/L (ref 0–40)
Albumin/Globulin Ratio: 1.7 (ref 1.1–2.5)
Albumin: 4.5 g/dL (ref 3.5–5.5)
Alkaline Phosphatase: 91 IU/L (ref 39–117)
BILIRUBIN TOTAL: 0.5 mg/dL (ref 0.0–1.2)
BUN/Creatinine Ratio: 10 (ref 9–23)
BUN: 13 mg/dL (ref 6–24)
CALCIUM: 10 mg/dL (ref 8.7–10.2)
CHLORIDE: 98 mmol/L (ref 97–108)
CO2: 25 mmol/L (ref 18–29)
Creatinine, Ser: 1.27 mg/dL — ABNORMAL HIGH (ref 0.57–1.00)
GFR, EST AFRICAN AMERICAN: 56 mL/min/{1.73_m2} — AB (ref >59)
GFR, EST NON AFRICAN AMERICAN: 49 mL/min/{1.73_m2} — AB (ref >59)
GLUCOSE: 111 mg/dL — AB (ref 65–99)
Globulin, Total: 2.7 g/dL (ref 1.5–4.5)
Potassium: 4.3 mmol/L (ref 3.5–5.2)
Sodium: 142 mmol/L (ref 134–144)
TOTAL PROTEIN: 7.2 g/dL (ref 6.0–8.5)

## 2014-10-19 LAB — TSH: TSH: 1.55 u[IU]/mL (ref 0.450–4.500)

## 2014-10-23 ENCOUNTER — Telehealth: Payer: Self-pay

## 2014-10-23 NOTE — Telephone Encounter (Signed)
-----   Message from Lorie Phenix, MD sent at 10/19/2014 10:25 AM EDT ----- Hgb slightly elevated.  Kidney function slightly off.  Stay well hydrated and ov to address. Thanks.

## 2014-10-23 NOTE — Telephone Encounter (Signed)
LMTCB 10/23/2014   Thanks,   -Laura  

## 2014-10-23 NOTE — Telephone Encounter (Signed)
Pt returning call.  VQ#[phone]/EP

## 2014-10-24 ENCOUNTER — Other Ambulatory Visit: Payer: Self-pay | Admitting: Family Medicine

## 2014-10-24 DIAGNOSIS — I10 Essential (primary) hypertension: Secondary | ICD-10-CM

## 2014-10-24 NOTE — Telephone Encounter (Signed)
Last OV 03/2014  Thanks,   -Laura  

## 2014-10-24 NOTE — Telephone Encounter (Signed)
Advised pt as directed below. Pt verbalized fully understanding. Pt stated that she will call back later to make an ov appointment.  Thanks,

## 2014-11-08 DIAGNOSIS — M129 Arthropathy, unspecified: Secondary | ICD-10-CM | POA: Diagnosis not present

## 2014-11-08 DIAGNOSIS — Z9689 Presence of other specified functional implants: Secondary | ICD-10-CM | POA: Diagnosis not present

## 2014-11-08 DIAGNOSIS — G894 Chronic pain syndrome: Secondary | ICD-10-CM | POA: Diagnosis not present

## 2014-11-08 DIAGNOSIS — Z79899 Other long term (current) drug therapy: Secondary | ICD-10-CM | POA: Diagnosis not present

## 2015-01-26 ENCOUNTER — Ambulatory Visit (INDEPENDENT_AMBULATORY_CARE_PROVIDER_SITE_OTHER): Payer: BLUE CROSS/BLUE SHIELD | Admitting: Family Medicine

## 2015-01-26 ENCOUNTER — Ambulatory Visit
Admission: RE | Admit: 2015-01-26 | Discharge: 2015-01-26 | Disposition: A | Discharge status: Home / Self Care | Payer: BLUE CROSS/BLUE SHIELD | Source: Ambulatory Visit | Attending: Family Medicine | Admitting: Family Medicine

## 2015-01-26 ENCOUNTER — Other Ambulatory Visit: Payer: Self-pay

## 2015-01-26 ENCOUNTER — Encounter: Payer: Self-pay | Admitting: Family Medicine

## 2015-01-26 VITALS — BP 116/80 | HR 100 | Temp 97.6°F | Resp 16 | Wt 152.0 lb

## 2015-01-26 DIAGNOSIS — R590 Localized enlarged lymph nodes: Secondary | ICD-10-CM

## 2015-01-26 DIAGNOSIS — F17200 Nicotine dependence, unspecified, uncomplicated: Secondary | ICD-10-CM | POA: Insufficient documentation

## 2015-01-26 DIAGNOSIS — L299 Pruritus, unspecified: Secondary | ICD-10-CM | POA: Diagnosis not present

## 2015-01-26 DIAGNOSIS — R591 Generalized enlarged lymph nodes: Secondary | ICD-10-CM | POA: Diagnosis not present

## 2015-01-26 MED ORDER — DOXYCYCLINE HYCLATE 100 MG PO TABS: 100.0000 mg | ORAL_TABLET | Freq: Two times a day (BID) | ORAL | Status: DC

## 2015-01-26 MED ORDER — TRIAMCINOLONE ACETONIDE 0.1 % EX CREA: 1.0000 "application " | TOPICAL_CREAM | Freq: Two times a day (BID) | CUTANEOUS | Status: DC

## 2015-01-26 NOTE — Progress Notes (Signed)
Subjective:    Patient ID: Tasha Nichols, female    DOB: [birth year], 49 y.o.   MRN: [id number]  HPI  Mass Located on right side of neck for three weeks. Pt denies pain. Pt reports it has grown in size. Was originally the size of a pea, and is now the size of a marble. Has also been having night sweat.  Still smokes.  Non-tender. Does not feel sick.    Pruritis Pt has noticed generalized body itching x 2-3 weeks. Pt thinks this is secondary to stress. Pt reports her daughter recently got a job as a Nature conservation officer. Pt notes the itching started around the same time her daughter started that job.   Review of Systems  Constitutional: Positive for diaphoresis (night time). Negative for fever, chills, activity change, appetite change, fatigue and unexpected weight change.  Respiratory: Negative for cough, shortness of breath and wheezing.   Cardiovascular: Negative for chest pain, palpitations and leg swelling.  Skin:       Itching is present, mass is present  Psychiatric/Behavioral: The patient is nervous/anxious.    BP 116/80 mmHg  Pulse 100  Temp(Src) 97.6 F (36.4 C) (Oral)  Resp 16  Wt 152 lb (68.947 kg)   Patient Active Problem List   Diagnosis Date Noted  . Acquired hypothyroidism 10/17/2014  . Arthritis 10/17/2014  . Back pain, chronic 10/17/2014  . Clinical depression 10/17/2014  . Essential (primary) hypertension 10/17/2014  . Acid reflux 10/17/2014  . Hypercholesteremia 10/17/2014  . Cannot sleep 10/17/2014  . Headache, migraine 10/17/2014  . Acquired polycythemia 10/17/2014  . Compulsive tobacco user syndrome 10/17/2014  . Presence of polypropylene mesh in chest wall 06/01/2013  . Presence of other specified functional implants 06/01/2013  . Disorder of hip joint 07/29/2012  . Chronic pain associated with significant psychosocial dysfunction 05/06/2012   No past medical history on file. Current Outpatient Prescriptions on File Prior to Visit    Medication Sig  . hydrochlorothiazide (HYDRODIURIL) 25 MG tablet Take 25 mg by mouth daily.   No current facility-administered medications on file prior to visit.   Allergies  Allergen Reactions  . Morphine Itching and Nausea And Vomiting  . Penicillins Rash   No past surgical history on file. Social History   Social History  . Marital Status: Married    Spouse Name: N/A  . Number of Children: N/A  . Years of Education: N/A   Occupational History  . Not on file.   Social History Main Topics  . Smoking status: Current Every Day Smoker -- 0.50 packs/day  . Smokeless tobacco: Never Used  . Alcohol Use: No  . Drug Use: No  . Sexual Activity: Not on file   Other Topics Concern  . Not on file   Social History Narrative   No family history on file.      Objective:   Physical Exam  Constitutional: She is oriented to person, place, and time. She appears well-developed and well-nourished.  Cardiovascular: Normal rate and regular rhythm.   Pulmonary/Chest: Effort normal and breath sounds normal.  Abdominal:   Palpable pain pump on right side  Lymphadenopathy:    Cervical adenopathy: supraclavicular on the right.   Neurological: She is alert and oriented to person, place, and time.   BP 116/80 mmHg  Pulse 100  Temp(Src) 97.6 F (36.4 C) (Oral)  Resp 16  Wt 152 lb (68.947 kg)     Assessment & Plan:  1. Lymphadenopathy of right  cervical region Will check labs and chest Xray. Will treat for infection and further plan pending these results.  Concerning for malignancy.   - CBC with Differential/Platelet - Comprehensive metabolic panel - DG Chest 2 View; Future - doxycycline (VIBRA-TABS) 100 MG tablet; Take 1 tablet (100 mg total) by mouth 2 (two) times daily.  Dispense: 20 tablet; Refill: 0  2. Pruritus Will treat. Check labs as above.  - triamcinolone cream (KENALOG) 0.1 %; Apply 1 application topically 2 (two) times daily.  Dispense: 30 g; Refill: 0   Patient  was seen and examined by Leo GrosserNancy J. Maloney, MD, and note scribed by Allene DillonEmily Drozdowski, CMA. I have reviewed the document for accuracy and completeness and I agree with above. Leo Grosser- Tasha J. Maloney, MD   Lorie PhenixNancy Maloney, MD

## 2015-01-27 LAB — CBC WITH DIFFERENTIAL/PLATELET
BASOS ABS: 0.1 10*3/uL (ref 0.0–0.2)
BASOS: 1 %
EOS (ABSOLUTE): 0.5 10*3/uL — AB (ref 0.0–0.4)
Eos: 5 %
Hematocrit: 47.2 % — ABNORMAL HIGH (ref 34.0–46.6)
Hemoglobin: 16.6 g/dL — ABNORMAL HIGH (ref 11.1–15.9)
IMMATURE GRANULOCYTES: 0 %
Immature Grans (Abs): 0 10*3/uL (ref 0.0–0.1)
Lymphocytes Absolute: 2.5 10*3/uL (ref 0.7–3.1)
Lymphs: 27 %
MCH: 31.3 pg (ref 26.6–33.0)
MCHC: 35.2 g/dL (ref 31.5–35.7)
MCV: 89 fL (ref 79–97)
MONOS ABS: 0.4 10*3/uL (ref 0.1–0.9)
Monocytes: 5 %
NEUTROS PCT: 62 %
Neutrophils Absolute: 6 10*3/uL (ref 1.4–7.0)
PLATELETS: 214 10*3/uL (ref 150–379)
RBC: 5.3 x10E6/uL — AB (ref 3.77–5.28)
RDW: 13.4 % (ref 12.3–15.4)
WBC: 9.5 10*3/uL (ref 3.4–10.8)

## 2015-01-27 LAB — COMPREHENSIVE METABOLIC PANEL
ALK PHOS: 87 IU/L (ref 39–117)
ALT: 17 IU/L (ref 0–32)
AST: 16 IU/L (ref 0–40)
Albumin/Globulin Ratio: 1.8 (ref 1.1–2.5)
Albumin: 4.5 g/dL (ref 3.5–5.5)
BILIRUBIN TOTAL: 0.3 mg/dL (ref 0.0–1.2)
BUN / CREAT RATIO: 12 (ref 9–23)
BUN: 15 mg/dL (ref 6–24)
CALCIUM: 9.9 mg/dL (ref 8.7–10.2)
CO2: 27 mmol/L (ref 18–29)
CREATININE: 1.27 mg/dL — AB (ref 0.57–1.00)
Chloride: 97 mmol/L (ref 96–106)
GFR calc non Af Amer: 49 mL/min/{1.73_m2} — ABNORMAL LOW (ref >59)
GFR, EST AFRICAN AMERICAN: 56 mL/min/{1.73_m2} — AB (ref >59)
GLUCOSE: 82 mg/dL (ref 65–99)
Globulin, Total: 2.5 g/dL (ref 1.5–4.5)
Potassium: 3.9 mmol/L (ref 3.5–5.2)
SODIUM: 139 mmol/L (ref 134–144)
Total Protein: 7 g/dL (ref 6.0–8.5)

## 2015-01-29 ENCOUNTER — Telehealth: Payer: Self-pay

## 2015-01-29 DIAGNOSIS — R591 Generalized enlarged lymph nodes: Secondary | ICD-10-CM | POA: Insufficient documentation

## 2015-01-29 NOTE — Telephone Encounter (Signed)
Pt advised.   Thanks,   -Lizzet Hendley  

## 2015-01-29 NOTE — Telephone Encounter (Signed)
-----   Message from Lorie PhenixNancy Maloney, MD sent at 01/26/2015  4:31 PM EST ----- Chest Xray with probable COPD, or emphysema.  Will call  Monday with labs and to see how patient is doing. Thanks.

## 2015-01-29 NOTE — Telephone Encounter (Signed)
Would recommend referral to surgeon to assess for possible biopsy. Order in. Please send to Maralyn SagoSarah after you speak with patient. Thanks.

## 2015-01-29 NOTE — Telephone Encounter (Signed)
Pt advised; She says the neck nodules are still there.  Thanks,   -Vernona RiegerLaura

## 2015-01-29 NOTE — Telephone Encounter (Signed)
Pt advised.   Thanks,   -Tasha Nichols  

## 2015-01-29 NOTE — Telephone Encounter (Signed)
-----   Message from Lorie PhenixNancy Maloney, MD sent at 01/27/2015  8:23 AM EST ----- Labs stable. Hgb elevated, suspect related to smoking.  Recheck in one week. Please see how her neck nodules are doing. Thanks.

## 2015-01-31 ENCOUNTER — Ambulatory Visit (INDEPENDENT_AMBULATORY_CARE_PROVIDER_SITE_OTHER): Payer: BLUE CROSS/BLUE SHIELD | Admitting: General Surgery

## 2015-01-31 ENCOUNTER — Encounter: Payer: Self-pay | Admitting: General Surgery

## 2015-01-31 VITALS — BP 130/70 | HR 76 | Resp 12 | Ht 64.0 in | Wt 152.0 lb

## 2015-01-31 DIAGNOSIS — R591 Generalized enlarged lymph nodes: Secondary | ICD-10-CM | POA: Diagnosis not present

## 2015-01-31 DIAGNOSIS — Z1239 Encounter for other screening for malignant neoplasm of breast: Secondary | ICD-10-CM | POA: Diagnosis not present

## 2015-01-31 DIAGNOSIS — R599 Enlarged lymph nodes, unspecified: Secondary | ICD-10-CM

## 2015-01-31 NOTE — Progress Notes (Signed)
Patient ID: Tasha Nichols, female   DOB: 03/31/1965, 49 y.o.   MRN: [id number]  Chief Complaint  Patient presents with  . Other    lymphadenopathy    HPI Tasha Nichols is a 49 y.o. female here today for a evaluation of lymphadenopathy. She found the knot on the right side of neck about 3-4 weeks ago . Pt denies pain. Pt reports it has grown in size but since the antibiotic she feels it maybe smaller. Was originally the size of a pea. She does have cat in the home that scratch her often.  Chest x ray was 01-26-15. Her last mammogram was 2014.  I have reviewed the history of present illness with the patient. HPI  Past Medical History  Diagnosis Date  . Thyroid disease   . Arthritis   . GERD (gastroesophageal reflux disease)   . Hypertension   . Chronic pain     Past Surgical History  Procedure Laterality Date  . Abdominal hysterectomy  2001  . Cesarean section  1994  . Joint replacement Right 2005, 2010    hip  . Pain pump implantation  2008, 2014    Dr Court Joyauch    History reviewed. No pertinent family history.  Social History Social History  Substance Use Topics  . Smoking status: Current Every Day Smoker -- 0.50 packs/day  . Smokeless tobacco: Never Used  . Alcohol Use: No    Allergies  Allergen Reactions  . Morphine Itching and Nausea And Vomiting  . Penicillins Rash    Current Outpatient Prescriptions  Medication Sig Dispense Refill  . doxycycline (VIBRA-TABS) 100 MG tablet Take 1 tablet (100 mg total) by mouth 2 (two) times daily. 20 tablet 0  . HYDROcodone-acetaminophen (NORCO/VICODIN) 5-325 MG tablet Take 1 tablet by mouth.    Marland Kitchen. ibuprofen (ADVIL,MOTRIN) 200 MG tablet Take 200 mg by mouth every 6 (six) hours as needed.    Marland Kitchen. levothyroxine (SYNTHROID, LEVOTHROID) 88 MCG tablet Take by mouth.    Marland Kitchen. PAIN MANAGEMENT IT PUMP REFILL 1 each by Intrathecal route once.    . triamterene-hydrochlorothiazide (DYAZIDE) 37.5-25 MG capsule Take 1 capsule by mouth daily.      No current facility-administered medications for this visit.    Review of Systems Review of Systems  Constitutional: Negative.   Respiratory: Negative.   Cardiovascular: Negative.     Blood pressure 130/70, pulse 76, resp. rate 12, height 5\' 4"  (1.626 m), weight 152 lb (68.947 kg).  Physical Exam Physical Exam  Constitutional: She is oriented to person, place, and time. She appears well-developed and well-nourished.  HENT:  Mouth/Throat: Oropharynx is clear and moist.  Eyes: Conjunctivae are normal. No scleral icterus.  Neck: Neck supple.  Cardiovascular: Normal rate, regular rhythm and normal heart sounds.   Pulmonary/Chest: Effort normal and breath sounds normal. Right breast exhibits no inverted nipple, no mass, no nipple discharge, no skin change and no tenderness. Left breast exhibits no inverted nipple, no mass, no nipple discharge, no skin change and no tenderness.  Abdominal: Soft. Normal appearance. There is no tenderness.  Pain pump right abdomen.  Lymphadenopathy:    She has cervical adenopathy.    She has no axillary adenopathy.  2cm cm firm smooth mobile neck node posterior triangle medially.  Neurological: She is alert and oriented to person, place, and time.  Skin: Skin is warm and dry.  Psychiatric: Her behavior is normal.    Data Reviewed PCP notes reviewed  Assessment    Enlarged right  neck lymph node. Clinicall no palpable nodes elsewhere, breast exam normal. May resolve with antibiotic if the etiology is infection.  Discussed fully with pt.  Plan    Follow up in 3 weeks.  Continue antibiotic.  Call sooner if symptoms worsen.  Recommend bilateral screening mammogram prior to next office visit. Patient has been scheduled for a bilateral screening mammogram at the [facility] for 02-09-15 at 8:20 am.     PCP:  Elease Hashimoto This information has been scribed by Dorathy Daft RNBC. Marland Kitchen   Billijo Dilling G 01/31/2015, 3:57  PM

## 2015-01-31 NOTE — Patient Instructions (Addendum)
The patient is aware to call back for any questions or concerns.  Patient has been [facility] for [facility] for [facility] at 8:20 am.

## 2015-02-09 ENCOUNTER — Ambulatory Visit
Admission: RE | Admit: 2015-02-09 | Discharge: 2015-02-09 | Disposition: A | Discharge status: Home / Self Care | Payer: BLUE CROSS/BLUE SHIELD | Source: Ambulatory Visit | Attending: General Surgery | Admitting: General Surgery

## 2015-02-09 DIAGNOSIS — Z1239 Encounter for other screening for malignant neoplasm of breast: Secondary | ICD-10-CM

## 2015-02-09 DIAGNOSIS — Z1231 Encounter for screening mammogram for malignant neoplasm of breast: Secondary | ICD-10-CM | POA: Diagnosis present

## 2015-02-20 ENCOUNTER — Ambulatory Visit: Payer: BLUE CROSS/BLUE SHIELD | Admitting: General Surgery

## 2015-02-22 ENCOUNTER — Ambulatory Visit (INDEPENDENT_AMBULATORY_CARE_PROVIDER_SITE_OTHER): Payer: BLUE CROSS/BLUE SHIELD | Admitting: General Surgery

## 2015-02-22 VITALS — BP 122/70 | HR 76 | Resp 12 | Ht 64.0 in | Wt 155.0 lb

## 2015-02-22 DIAGNOSIS — R599 Enlarged lymph nodes, unspecified: Secondary | ICD-10-CM

## 2015-02-22 DIAGNOSIS — R591 Generalized enlarged lymph nodes: Secondary | ICD-10-CM

## 2015-02-22 NOTE — Patient Instructions (Signed)
Patient to return in 1 to 2 months.

## 2015-02-22 NOTE — Progress Notes (Signed)
Patient ID: Tasha Nichols, female   DOB: [birth year], 50 y.o.   MRN: [id number]  Chief Complaint  Patient presents with  . Follow-up    enlarged neck lymphnode    HPI Tasha Nichols is a 50 y.o. female here today for her three week follow up enlarged neck lymph node. Patient states no change in size since her last visit. Completed her antibiotics I have reviewed the history of present illness with the patient. HPI  Past Medical History  Diagnosis Date  . Thyroid disease   . Arthritis   . GERD (gastroesophageal reflux disease)   . Hypertension   . Chronic pain     Past Surgical History  Procedure Laterality Date  . Abdominal hysterectomy  2001  . Cesarean section  1994  . Joint replacement Right 2005, 2010    hip  . Pain pump implantation  2008, 2014    Dr Court Joyauch    Family History  Problem Relation Age of Onset  . Breast cancer Neg Hx     Social History Social History  Substance Use Topics  . Smoking status: Current Every Day Smoker -- 0.50 packs/day  . Smokeless tobacco: Never Used  . Alcohol Use: No    Allergies  Allergen Reactions  . Morphine Itching and Nausea And Vomiting  . Penicillins Rash    Current Outpatient Prescriptions  Medication Sig Dispense Refill  . ibuprofen (ADVIL,MOTRIN) 200 MG tablet Take 200 mg by mouth every 6 (six) hours as needed.    Marland Kitchen. levothyroxine (SYNTHROID, LEVOTHROID) 88 MCG tablet Take by mouth.    Marland Kitchen. PAIN MANAGEMENT IT PUMP REFILL 1 each by Intrathecal route once.    . triamterene-hydrochlorothiazide (DYAZIDE) 37.5-25 MG capsule Take 1 capsule by mouth daily.     No current facility-administered medications for this visit.    Review of Systems Review of Systems  Blood pressure 122/70, pulse 76, resp. rate 12, height 5\' 4"  (1.626 m), weight 155 lb (70.308 kg).  Physical Exam Physical Exam  Constitutional: She is oriented to person, place, and time. She appears well-developed.  Lymphadenopathy:    She has cervical  adenopathy.    She has no axillary adenopathy.  Neurological: She is alert and oriented to person, place, and time.  Skin: Skin is warm and dry.   1cm  firm smooth mobile neck node posterior triangle medially. Was 2 cm a month ago .  Data Reviewed Notes reviewed  Assessment    Enlarged neck lymph node decreased in size-likely hyperplastic node     Plan   Patient to return in 6 weeks.     PCP:  Elease HashimotoMaloney  This information has been scribed by Ples SpecterJessica Qualls CMA.   Jef Futch G 02/23/2015, 10:05 AM

## 2015-02-23 ENCOUNTER — Encounter: Payer: Self-pay | Admitting: General Surgery

## 2015-03-15 ENCOUNTER — Encounter: Payer: Self-pay | Admitting: Family Medicine

## 2015-03-15 ENCOUNTER — Ambulatory Visit (INDEPENDENT_AMBULATORY_CARE_PROVIDER_SITE_OTHER): Payer: BLUE CROSS/BLUE SHIELD | Admitting: Family Medicine

## 2015-03-15 VITALS — BP 102/66 | HR 76 | Temp 97.8°F | Resp 16 | Ht 64.0 in | Wt 156.0 lb

## 2015-03-15 DIAGNOSIS — I10 Essential (primary) hypertension: Secondary | ICD-10-CM | POA: Diagnosis not present

## 2015-03-15 DIAGNOSIS — Z716 Tobacco abuse counseling: Secondary | ICD-10-CM | POA: Insufficient documentation

## 2015-03-15 DIAGNOSIS — R591 Generalized enlarged lymph nodes: Secondary | ICD-10-CM

## 2015-03-15 DIAGNOSIS — F17200 Nicotine dependence, unspecified, uncomplicated: Secondary | ICD-10-CM

## 2015-03-15 DIAGNOSIS — Z0000 Encounter for general adult medical examination without abnormal findings: Secondary | ICD-10-CM

## 2015-03-15 NOTE — Progress Notes (Signed)
Patient ID: Tasha Nichols, female   DOB: 1965/07/01, 50 y.o.   MRN: [id number]       [name], Female    DOB: Mar 22, 1965, 50 y.o.   MRN: [id number] Visit Date: 03/15/2015  Today's Provider: Lorie Phenix, MD   Chief Complaint  Patient presents with  . Annual Exam   Subjective:    Annual physical exam Tasha Nichols is a 50 y.o. female who presents today for health maintenance and complete physical. She feels well. She reports exercising active with daily activites. She reports she is sleeping fairly well 5 hours a night, unchanged for several years.  10/26/13 CPE 02/09/15 Mammo-BI-RADS 1 07/11/13 Colon-polyps, recheck in 5 yrs  -----------------------------------------------------------------   Review of Systems  Constitutional: Positive for fatigue (irritability).  HENT: Negative.   Eyes: Negative.   Respiratory: Negative.   Cardiovascular: Negative.   Gastrointestinal: Positive for nausea.  Endocrine: Negative.   Genitourinary: Negative.   Musculoskeletal: Positive for back pain, joint swelling, arthralgias and gait problem.  Skin: Negative.   Allergic/Immunologic: Negative.   Neurological: Negative.   Hematological: Negative.   Psychiatric/Behavioral: Positive for sleep disturbance.    Social History      She  reports that she has been smoking.  She has never used smokeless tobacco. She reports that she does not drink alcohol or use illicit drugs.       Social History   Social History  . Marital Status: Married    Spouse Name: N/A  . Number of Children: N/A  . Years of Education: N/A   Social History Main Topics  . Smoking status: Current Every Day Smoker -- 0.50 packs/day  . Smokeless tobacco: Never Used  . Alcohol Use: No  . Drug Use: No  . Sexual Activity: Not Asked   Other Topics Concern  . None   Social History Narrative    Past Medical History  Diagnosis Date  . Thyroid disease   . Arthritis   . GERD (gastroesophageal reflux  disease)   . Hypertension   . Chronic pain      Patient Active Problem List   Diagnosis Date Noted  . Tobacco abuse counseling 03/15/2015  . Lymphadenopathy 01/29/2015  . Acquired hypothyroidism 10/17/2014  . Arthritis 10/17/2014  . Back pain, chronic 10/17/2014  . Clinical depression 10/17/2014  . Essential (primary) hypertension 10/17/2014  . Acid reflux 10/17/2014  . Hypercholesteremia 10/17/2014  . Cannot sleep 10/17/2014  . Headache, migraine 10/17/2014  . Acquired polycythemia 10/17/2014  . Compulsive tobacco user syndrome 10/17/2014  . Presence of polypropylene mesh in chest wall 06/01/2013  . Presence of other specified functional implants 06/01/2013  . Disorder of hip joint 07/29/2012  . Chronic pain associated with significant psychosocial dysfunction 05/06/2012    Past Surgical History  Procedure Laterality Date  . Abdominal hysterectomy  2001  . Cesarean section  1994  . Joint replacement Right 2005, 2010    hip  . Pain pump implantation  2008, 2014    Dr Court Joy  . Tonsillectomy  1970    Family History        Family Status  Relation Status Death Age  . Mother Alive   . Father Deceased         Her family history is negative for Breast cancer.    Allergies  Allergen Reactions  . Morphine Itching and Nausea And Vomiting  . Penicillins Rash    Previous Medications   HYDROCODONE-ACETAMINOPHEN (NORCO/VICODIN) 5-325 MG TABLET  Take by mouth. 1 tablet 3 times a day as needed for pain   IBUPROFEN (ADVIL,MOTRIN) 200 MG TABLET    Take 200 mg by mouth every 6 (six) hours as needed.   LEVOTHYROXINE (SYNTHROID, LEVOTHROID) 88 MCG TABLET    Take by mouth.   PAIN MANAGEMENT IT PUMP REFILL    1 each by Intrathecal route once.   TRIAMTERENE-HYDROCHLOROTHIAZIDE (DYAZIDE) 37.5-25 MG CAPSULE    Take 1 capsule by mouth daily.    Patient Care Team: Lorie Phenix, MD as PCP - General (Family Medicine) Lorie Phenix, MD as Referring Physician (Family  Medicine) Seeplaputhur Wynona Luna, MD (General Surgery)     Objective:   Vitals: BP 102/66 mmHg  Pulse 76  Temp(Src) 97.8 F (36.6 C) (Oral)  Resp 16  Ht  (1.626 m)  Wt 156 lb (70.761 kg)  BMI 26.76 kg/m2   Physical Exam  Constitutional: She is oriented to person, place, and time. She appears well-developed and well-nourished.  HENT:  Head: Normocephalic and atraumatic.  Right Ear: Tympanic membrane, external ear and ear canal normal.  Left Ear: Tympanic membrane, external ear and ear canal normal.  Nose: Nose normal.  Mouth/Throat: Uvula is midline, oropharynx is clear and moist and mucous membranes are normal.  Eyes: Conjunctivae, EOM and lids are normal. Pupils are equal, round, and reactive to light.  Neck: Trachea normal and normal range of motion. Neck supple. Carotid bruit is not present. No thyroid mass and no thyromegaly present.  Cardiovascular: Normal rate, regular rhythm and normal heart sounds.   Pulmonary/Chest: Effort normal and breath sounds normal.  Abdominal: Soft. Normal appearance and bowel sounds are normal. There is no hepatosplenomegaly. There is no tenderness.  Musculoskeletal: Normal range of motion.  Lymphadenopathy:    She has no cervical adenopathy.    She has no axillary adenopathy.  Neurological: She is alert and oriented to person, place, and time. She has normal strength. No cranial nerve deficit.  Skin: Skin is warm, dry and intact.  Psychiatric: She has a normal mood and affect. Her speech is normal and behavior is normal. Judgment and thought content normal. Cognition and memory are normal.     Depression Screen PHQ 2/9 Scores 03/15/2015  PHQ - 2 Score 1      Assessment & Plan:     Routine Health Maintenance and Physical Exam  Exercise Activities and Dietary recommendations Goals    None      Immunization History  Administered Date(s) Administered  . Td 10/16/2010  . Tdap 10/16/2010        1. Annual physical  exam Stable. Patient advised to continue eating healthy and exercise daily.  2. Essential (primary) hypertension F/U pending lab report. - CBC with Differential/Platelet  3. Compulsive tobacco user syndrome Still smoking.   4. Lymphadenopathy Stable. Patient advised to keep F/U with Dr. Evette Cristal.  5. Tobacco abuse counseling Not ready to quit.  Did counsel on the importance of quitting.    Patient seen and examined by Dr. Leo Grosser, and note scribed by Liz Beach. Dimas, CMA. I have reviewed the document for accuracy and completeness and I agree with above. Leo Grosser, MD   Lorie Phenix, MD   --------------------------------------------------------------------

## 2015-03-16 ENCOUNTER — Telehealth: Payer: Self-pay

## 2015-03-16 LAB — CBC WITH DIFFERENTIAL/PLATELET
BASOS ABS: 0 10*3/uL (ref 0.0–0.2)
Basos: 0 %
EOS (ABSOLUTE): 0.2 10*3/uL (ref 0.0–0.4)
Eos: 3 %
Hematocrit: 45.3 % (ref 34.0–46.6)
Hemoglobin: 15.5 g/dL (ref 11.1–15.9)
IMMATURE GRANS (ABS): 0 10*3/uL (ref 0.0–0.1)
Immature Granulocytes: 0 %
LYMPHS: 29 %
Lymphocytes Absolute: 2.6 10*3/uL (ref 0.7–3.1)
MCH: 30.5 pg (ref 26.6–33.0)
MCHC: 34.2 g/dL (ref 31.5–35.7)
MCV: 89 fL (ref 79–97)
Monocytes Absolute: 0.6 10*3/uL (ref 0.1–0.9)
Monocytes: 7 %
NEUTROS ABS: 5.4 10*3/uL (ref 1.4–7.0)
NEUTROS PCT: 61 %
PLATELETS: 222 10*3/uL (ref 150–379)
RBC: 5.09 x10E6/uL (ref 3.77–5.28)
RDW: 13.6 % (ref 12.3–15.4)
WBC: 8.8 10*3/uL (ref 3.4–10.8)

## 2015-03-16 NOTE — Telephone Encounter (Signed)
Pt advised.   Thanks,   -Shanieka Blea  

## 2015-03-16 NOTE — Telephone Encounter (Signed)
-----   Message from Lorie Phenix, MD sent at 03/16/2015 10:31 AM EST ----- Labs stable. Please notify patient. Thanks.

## 2015-04-03 ENCOUNTER — Ambulatory Visit (INDEPENDENT_AMBULATORY_CARE_PROVIDER_SITE_OTHER): Payer: BLUE CROSS/BLUE SHIELD | Admitting: Family Medicine

## 2015-04-03 ENCOUNTER — Encounter: Payer: Self-pay | Admitting: Family Medicine

## 2015-04-03 VITALS — BP 142/72 | HR 108 | Temp 97.3°F | Resp 20 | Wt 153.0 lb

## 2015-04-03 DIAGNOSIS — J069 Acute upper respiratory infection, unspecified: Secondary | ICD-10-CM | POA: Diagnosis not present

## 2015-04-03 DIAGNOSIS — J441 Chronic obstructive pulmonary disease with (acute) exacerbation: Secondary | ICD-10-CM

## 2015-04-03 DIAGNOSIS — J40 Bronchitis, not specified as acute or chronic: Secondary | ICD-10-CM | POA: Diagnosis not present

## 2015-04-03 DIAGNOSIS — J449 Chronic obstructive pulmonary disease, unspecified: Secondary | ICD-10-CM | POA: Insufficient documentation

## 2015-04-03 LAB — POCT INFLUENZA A/B
Influenza A, POC: NEGATIVE
Influenza B, POC: NEGATIVE

## 2015-04-03 MED ORDER — PREDNISONE 10 MG PO TABS: ORAL_TABLET | ORAL | Status: DC

## 2015-04-03 MED ORDER — AZITHROMYCIN 250 MG PO TABS: ORAL_TABLET | ORAL | Status: DC

## 2015-04-03 MED ORDER — ALBUTEROL SULFATE HFA 108 (90 BASE) MCG/ACT IN AERS: 2.0000 | INHALATION_SPRAY | Freq: Four times a day (QID) | RESPIRATORY_TRACT | Status: DC | PRN

## 2015-04-03 NOTE — Progress Notes (Signed)
Patient ID: Tasha Nichols, female   DOB: [birth year], 50 y.o.   MRN: [id number]         [name] Female    DOB: [birth year]   50 y.o.   MRN: [id number] Visit Date: 04/03/2015  Today's Provider: Lorie Phenix, MD   Chief Complaint  Patient presents with  . Bronchitis  . URI   Subjective:    URI  This is a new problem. The current episode started in the past 7 days. The problem has been rapidly worsening. There has been no fever. Associated symptoms include chest pain, congestion, coughing, ear pain, rhinorrhea and wheezing. Pertinent negatives include no abdominal pain, headaches or joint pain.   Patient with significant respiratory distress on presentation. Barely make it into building. Not moving air very well.  Does not have an inhaler at home. No known sick contacts.       Allergies  Allergen Reactions  . Morphine Itching and Nausea And Vomiting  . Penicillins Rash   Previous Medications   HYDROCODONE-ACETAMINOPHEN (NORCO/VICODIN) 5-325 MG TABLET    Take by mouth. 1 tablet 3 times a day as needed for pain   IBUPROFEN (ADVIL,MOTRIN) 200 MG TABLET    Take 200 mg by mouth every 6 (six) hours as needed.   LEVOTHYROXINE (SYNTHROID, LEVOTHROID) 88 MCG TABLET    Take by mouth.   PAIN MANAGEMENT IT PUMP REFILL    1 each by Intrathecal route once.   TRIAMTERENE-HYDROCHLOROTHIAZIDE (DYAZIDE) 37.5-25 MG CAPSULE    Take 1 capsule by mouth daily.    Review of Systems  Constitutional: Positive for fatigue. Negative for fever, chills, diaphoresis, activity change, appetite change and unexpected weight change.  HENT: Positive for congestion, ear pain and rhinorrhea.   Respiratory: Positive for cough, chest tightness, shortness of breath and wheezing. Negative for apnea, choking and stridor.   Cardiovascular: Positive for chest pain. Negative for palpitations and leg swelling.  Gastrointestinal: Negative.  Negative for abdominal pain.  Musculoskeletal: Negative for joint  pain.  Neurological: Positive for light-headedness. Negative for dizziness and headaches.    Social History  Substance Use Topics  . Smoking status: Current Every Day Smoker -- 0.50 packs/day  . Smokeless tobacco: Never Used  . Alcohol Use: No   Objective:   BP 142/72 mmHg  Pulse 108  Temp(Src) 97.3 F (36.3 C) (Oral)  Resp 20  Wt 153 lb (69.4 kg)  SpO2 94% Physical Exam  Constitutional: She is oriented to person, place, and time. She appears well-developed and well-nourished. She appears distressed (mildly.  ).  Cardiovascular: Normal rate and regular rhythm.   Pulmonary/Chest: She is in respiratory distress (Marked decreased air movement on presentation. Greatly improved with increased air movement after nebulizers tiimes 2. ). She has wheezes.  Neurological: She is alert and oriented to person, place, and time.      Assessment & Plan:     1. Bronchitis Flu negative.  - POCT Influenza A/B  2. Upper respiratory infection Unclear of trigger for exacerbation.  - POCT Influenza A/B  3. COPD exacerbation (HCC) New problem.  Moderate distress on presentation.  Did stabilize with 2 rounds of neb treatment. Still tight but greatly improved. Patient reports feeling 90 percent better.  Depo-medrol today.  Rx as below. Patient instructed to call back if condition worsens or does not improve.   ER if worsens.  Recheck  OV in one week.  - predniSONE (DELTASONE) 10 MG tablet; 6 po for 2 days and  then 5 po for 2 days and then 4 po for 2 days and 3 po for 2 days and then 2 po for 2 days and then 1 po for 2 days.  Dispense: 42 tablet; Refill: 0 - albuterol (PROVENTIL HFA;VENTOLIN HFA) 108 (90 Base) MCG/ACT inhaler; Inhale 2 puffs into the lungs every 6 (six) hours as needed for wheezing or shortness of breath.  Dispense: 1 Inhaler; Refill: 2 - azithromycin (ZITHROMAX) 250 MG tablet; 2 today and then one a day for 4 more days.  Dispense: 6 tablet; Refill: 0     Patient was seen and examined  by Leo Grosser, MD, and note scribed by Kavin Leech, CMA.  I have reviewed the document for accuracy and completeness and I agree with above. - Leo Grosser, MD   Lorie Phenix, MD  [facility]

## 2015-04-04 MED ORDER — LEVALBUTEROL HCL 1.25 MG/0.5ML IN NEBU: 1.2500 mg | INHALATION_SOLUTION | Freq: Once | RESPIRATORY_TRACT | Status: DC

## 2015-04-04 MED ORDER — METHYLPREDNISOLONE ACETATE 80 MG/ML IJ SUSP: 80.0000 mg | Freq: Once | INTRAMUSCULAR | Status: DC

## 2015-04-04 NOTE — Addendum Note (Signed)
Addended by: Kavin Leech E on: 04/04/2015 08:50 AM   Modules accepted: Orders

## 2015-04-05 ENCOUNTER — Emergency Department: Payer: BLUE CROSS/BLUE SHIELD

## 2015-04-05 ENCOUNTER — Encounter: Payer: Self-pay | Admitting: General Surgery

## 2015-04-05 ENCOUNTER — Emergency Department
Admission: EM | Admit: 2015-04-05 | Discharge: 2015-04-05 | Disposition: A | Discharge status: Home / Self Care | Payer: BLUE CROSS/BLUE SHIELD | Attending: Emergency Medicine | Admitting: Emergency Medicine

## 2015-04-05 ENCOUNTER — Ambulatory Visit (INDEPENDENT_AMBULATORY_CARE_PROVIDER_SITE_OTHER): Payer: BLUE CROSS/BLUE SHIELD | Admitting: General Surgery

## 2015-04-05 VITALS — BP 127/62 | Ht 64.0 in | Wt 150.0 lb

## 2015-04-05 DIAGNOSIS — Z880 Allergy status to penicillin: Secondary | ICD-10-CM | POA: Insufficient documentation

## 2015-04-05 DIAGNOSIS — Z792 Long term (current) use of antibiotics: Secondary | ICD-10-CM | POA: Insufficient documentation

## 2015-04-05 DIAGNOSIS — Z79899 Other long term (current) drug therapy: Secondary | ICD-10-CM | POA: Insufficient documentation

## 2015-04-05 DIAGNOSIS — E039 Hypothyroidism, unspecified: Secondary | ICD-10-CM | POA: Insufficient documentation

## 2015-04-05 DIAGNOSIS — J449 Chronic obstructive pulmonary disease, unspecified: Secondary | ICD-10-CM

## 2015-04-05 DIAGNOSIS — I10 Essential (primary) hypertension: Secondary | ICD-10-CM | POA: Insufficient documentation

## 2015-04-05 DIAGNOSIS — F17200 Nicotine dependence, unspecified, uncomplicated: Secondary | ICD-10-CM | POA: Diagnosis not present

## 2015-04-05 DIAGNOSIS — R05 Cough: Secondary | ICD-10-CM | POA: Diagnosis present

## 2015-04-05 DIAGNOSIS — R591 Generalized enlarged lymph nodes: Secondary | ICD-10-CM

## 2015-04-05 DIAGNOSIS — R599 Enlarged lymph nodes, unspecified: Secondary | ICD-10-CM

## 2015-04-05 MED ORDER — PSEUDOEPH-BROMPHEN-DM 30-2-10 MG/5ML PO SYRP: 5.0000 mL | ORAL_SOLUTION | Freq: Four times a day (QID) | ORAL | Status: DC | PRN

## 2015-04-05 MED ORDER — IPRATROPIUM-ALBUTEROL 0.5-2.5 (3) MG/3ML IN SOLN
3.0000 mL | Freq: Once | RESPIRATORY_TRACT | Status: AC
  Administered 2015-04-05: 3 mL via RESPIRATORY_TRACT
  Filled 2015-04-05: qty 3

## 2015-04-05 NOTE — Progress Notes (Signed)
Patient ID: Tasha Nichols, female   DOB: 03-12-1965, 50 y.o.   MRN: [id number] Patient here today for a 6 week follow up for an enlarged neck lymph node. Patient states there has been no changes since last visit. She was placed on prednisone on 04/03/15 for bronchitis, she is a little better.  I have reviewed the history of present illness with the patient.    Previously noted lymph node right posterior neck area is fully resolved. No palpable masses in neck or axilla.   Return as needed.   This information has been scribed by Milas Kocher, CMA     PCP: Dr. Elease Hashimoto

## 2015-04-05 NOTE — ED Notes (Signed)
Pt c/o cough with congestion since Sunday,, denies fever.the patient is tearful in triage.. Pt is in no acute respiratory distress at present.Marland Kitchen

## 2015-04-05 NOTE — Patient Instructions (Signed)
Return as needed

## 2015-04-05 NOTE — ED Notes (Signed)
States she developed cough and chest congestion about 1 week ago  Was seen by her PCP yesterday..but today is still unable to catch her breath  resp labored with ambulation. Unable to speak full sentences

## 2015-04-05 NOTE — ED Provider Notes (Signed)
[facility] Emergency Department Provider Note  ____________________________________________  Time seen: Approximately 11:21 AM  I have reviewed the triage vital signs and the nursing notes.   HISTORY  Chief Complaint URI    HPI Tasha Nichols is a 50 y.o. female patient complaining of cough congestion for 4 days. Patient denies any fever with this complaint.The patient was seen by PCP 2 days ago and given 2 breathing treatments and inhalers. She felt remarkably well after the nebulized treatment. Patient states her complaint return after leaving the doctor's office. Patient state no relief using her inhaler. Patient is currently taking prednisone. Patient state no history of reactive airway disease. Patient stated there is a nonproductive cough with this complaint. Except for the steroids and Proventil no other palliative measure for this complaint.  Past Medical History  Diagnosis Date  . Thyroid disease   . Arthritis   . GERD (gastroesophageal reflux disease)   . Hypertension   . Chronic pain     Patient Active Problem List   Diagnosis Date Noted  . COPD exacerbation (HCC) 04/03/2015  . Tobacco abuse counseling 03/15/2015  . Lymphadenopathy 01/29/2015  . Acquired hypothyroidism 10/17/2014  . Arthritis 10/17/2014  . Back pain, chronic 10/17/2014  . Clinical depression 10/17/2014  . Essential (primary) hypertension 10/17/2014  . Acid reflux 10/17/2014  . Hypercholesteremia 10/17/2014  . Cannot sleep 10/17/2014  . Headache, migraine 10/17/2014  . Acquired polycythemia 10/17/2014  . Compulsive tobacco user syndrome 10/17/2014  . Presence of polypropylene mesh in chest wall 06/01/2013  . Presence of other specified functional implants 06/01/2013  . Disorder of hip joint 07/29/2012  . Chronic pain associated with significant psychosocial dysfunction 05/06/2012    Past Surgical History  Procedure Laterality Date  . Abdominal hysterectomy  2001  .  Cesarean section  1994  . Joint replacement Right 2005, 2010    hip  . Pain pump implantation  2008, 2014    Dr Court Joy  . Tonsillectomy  1970    Current Outpatient Rx  Name  Route  Sig  Dispense  Refill  . albuterol (PROVENTIL HFA;VENTOLIN HFA) 108 (90 Base) MCG/ACT inhaler   Inhalation   Inhale 2 puffs into the lungs every 6 (six) hours as needed for wheezing or shortness of breath.   1 Inhaler   2   . azithromycin (ZITHROMAX) 250 MG tablet      2 today and then one a day for 4 more days.   6 tablet   0   . brompheniramine-pseudoephedrine-DM 30-2-10 MG/5ML syrup   Oral   Take 5 mLs by mouth 4 (four) times daily as needed.   120 mL   0   . HYDROcodone-acetaminophen (NORCO/VICODIN) 5-325 MG tablet   Oral   Take by mouth. 1 tablet 3 times a day as needed for pain         . ibuprofen (ADVIL,MOTRIN) 200 MG tablet   Oral   Take 200 mg by mouth every 6 (six) hours as needed.         Marland Kitchen levothyroxine (SYNTHROID, LEVOTHROID) 88 MCG tablet   Oral   Take by mouth.         Marland Kitchen PAIN MANAGEMENT IT PUMP REFILL   Intrathecal   1 each by Intrathecal route once.         . predniSONE (DELTASONE) 10 MG tablet      6 po for 2 days and then 5 po for 2 days and then 4 po  for 2 days and 3 po for 2 days and then 2 po for 2 days and then 1 po for 2 days.   42 tablet   0   . triamterene-hydrochlorothiazide (DYAZIDE) 37.5-25 MG capsule   Oral   Take 1 capsule by mouth daily.           Allergies Morphine and Penicillins  Family History  Problem Relation Age of Onset  . Breast cancer Neg Hx     Social History Social History  Substance Use Topics  . Smoking status: Current Every Day Smoker -- 0.50 packs/day  . Smokeless tobacco: Never Used  . Alcohol Use: No    Review of Systems Constitutional: No fever/chills Eyes: No visual changes. ENT: No sore throat. Cardiovascular: Denies chest pain. Respiratory: Denies shortness of breath. Gastrointestinal: No abdominal  pain.  No nausea, no vomiting.  No diarrhea.  No constipation. Genitourinary: Negative for dysuria. Musculoskeletal: Negative for back pain. Skin: Negative for rash. Neurological: Negative for headaches, focal weakness or numbness. Endocrine:Hypertension and hypothyroidism. Hematological/Lymphatic: Allergic/Immunilogical: Morphine and penicillin  10-point ROS otherwise negative.  ____________________________________________   PHYSICAL EXAM:  VITAL SIGNS: ED Triage Vitals  Enc Vitals Group     BP 04/05/15 1105 126/81 mmHg     Pulse Rate 04/05/15 1105 90     Resp 04/05/15 1105 24     Temp 04/05/15 1105 97.7 F (36.5 C)     Temp Source 04/05/15 1105 Oral     SpO2 04/05/15 1105 95 %     Weight 04/05/15 1105 150 lb (68.04 kg)     Height 04/05/15 1105  (1.626 m)     Head Cir --      Peak Flow --      Pain Score --      Pain Loc --      Pain Edu? --      Excl. in GC? --     Constitutional: Alert and oriented. Well appearing and in no acute distress. Eyes: Conjunctivae are normal. PERRL. EOMI. Head: Atraumatic. Nose: No congestion/rhinnorhea. Mouth/Throat: Mucous membranes are moist.  Oropharynx non-erythematous. Neck: No stridor.  No cervical spine tenderness to palpation. Hematological/Lymphatic/Immunilogical: No cervical lymphadenopathy. Cardiovascular: Normal rate, regular rhythm. Grossly normal heart sounds.  Good peripheral circulation. Respiratory: Normal respiratory effort.  No retractions. Lungs CTAB. Gastrointestinal: Soft and nontender. No distention. No abdominal bruits. No CVA tenderness. Musculoskeletal: No lower extremity tenderness nor edema.  No joint effusions. Neurologic:  Normal speech and language. No gross focal neurologic deficits are appreciated. No gait instability. Skin:  Skin is warm, dry and intact. No rash noted. Psychiatric: Mood and affect are normal. Speech and behavior are normal.  ____________________________________________    LABS (all labs ordered are listed, but only abnormal results are displayed)  Labs Reviewed - No data to display ____________________________________________  EKG   ____________________________________________  RADIOLOGY   ____Chest x-ray findings consistent with COPD. ________________________________________   PROCEDURES  Procedure(s) performed: None  Critical Care performed: No  ____________________________________________   INITIAL IMPRESSION / ASSESSMENT AND PLAN / ED COURSE  Pertinent labs & imaging results that were available during my care of the patient were reviewed by me and considered in my medical decision making (see chart for details).  COPD. Discussed x-ray findings with patient. Patient advised to follow-up family doctor for reevaluation continued care at this complaint. She advised to continue taking her medicine as directed. Patient given a prescription for Bromfed-DM. ____________________________________________   FINAL CLINICAL IMPRESSION(S) / ED  DIAGNOSES  Final diagnoses:  COPD suggested by initial evaluation [facility])      Joni Reining, PA-C 04/05/15 1209  Arnaldo Natal, MD 04/05/15 [id number]

## 2015-04-10 ENCOUNTER — Encounter: Payer: Self-pay | Admitting: Family Medicine

## 2015-04-10 ENCOUNTER — Ambulatory Visit (INDEPENDENT_AMBULATORY_CARE_PROVIDER_SITE_OTHER): Payer: BLUE CROSS/BLUE SHIELD | Admitting: Family Medicine

## 2015-04-10 VITALS — BP 100/68 | HR 86 | Temp 97.8°F | Resp 16 | Wt 155.0 lb

## 2015-04-10 DIAGNOSIS — J449 Chronic obstructive pulmonary disease, unspecified: Secondary | ICD-10-CM

## 2015-04-10 DIAGNOSIS — J40 Bronchitis, not specified as acute or chronic: Secondary | ICD-10-CM | POA: Diagnosis not present

## 2015-04-10 NOTE — Progress Notes (Signed)
Patient ID: Tasha Nichols, female   DOB: [birth year], 50 y.o.   MRN: [id number]       [name] Female    DOB: 1965-04-18   50 y.o.   MRN: [id number] Visit Date: 04/10/2015  Today's Provider: Lorie Phenix, MD   Chief Complaint  Patient presents with  . COPD    follow-up  . Follow-up   Subjective:    HPI   Follow up for COPD EXACERBATION  The patient was last seen for this 1 weeks ago. Changes made at last visit include started prednisone, Azithromycin.  She reports excellent compliance with treatment. She feels that condition is Improved. She is not having side effects.  Patient reports that she last used her Albuterol inhaler yesterday once. ------------------------------------------------------------------------------------    Follow up ER visit  Patient was seen in ER for COPD exacerbation on 04/05/2015. She was treated for COPD. Treatment for this included nebulizer at ER and chest x-ray that showed COPD. She reports excellent compliance with treatment. She reports this condition is Improved. Patient was prescribed cough medication that she take at bedtime. ------------------------------------------------------------------------------------  She is not symptomatic today. No SOB with activity now.        Allergies  Allergen Reactions  . Morphine Itching and Nausea And Vomiting  . Penicillins Rash   Previous Medications   ALBUTEROL (PROVENTIL HFA;VENTOLIN HFA) 108 (90 BASE) MCG/ACT INHALER    Inhale 2 puffs into the lungs every 6 (six) hours as needed for wheezing or shortness of breath.   BROMPHENIRAMINE-PSEUDOEPHEDRINE-DM 30-2-10 MG/5ML SYRUP    Take 5 mLs by mouth 4 (four) times daily as needed.   HYDROCODONE-ACETAMINOPHEN (NORCO/VICODIN) 5-325 MG TABLET    Take by mouth. 1 tablet 3 times a day as needed for pain   IBUPROFEN (ADVIL,MOTRIN) 200 MG TABLET    Take 200 mg by mouth every 6 (six) hours as needed.   LEVOTHYROXINE (SYNTHROID,  LEVOTHROID) 88 MCG TABLET    Take by mouth.   PAIN MANAGEMENT IT PUMP REFILL    1 each by Intrathecal route once.   PREDNISONE (DELTASONE) 10 MG TABLET    6 po for 2 days and then 5 po for 2 days and then 4 po for 2 days and 3 po for 2 days and then 2 po for 2 days and then 1 po for 2 days.   TRIAMTERENE-HYDROCHLOROTHIAZIDE (DYAZIDE) 37.5-25 MG CAPSULE    Take 1 capsule by mouth daily.    Review of Systems  Constitutional: Negative.   HENT: Negative.   Respiratory: Positive for cough.     Social History  Substance Use Topics  . Smoking status: Current Every Day Smoker -- 0.50 packs/day  . Smokeless tobacco: Never Used  . Alcohol Use: No   Objective:   BP 100/68 mmHg  Pulse 86  Temp(Src) 97.8 F (36.6 C) (Oral)  Resp 16  Wt 155 lb (70.308 kg)  SpO2 97%  Physical Exam  Constitutional: She is oriented to person, place, and time. She appears well-developed and well-nourished.  Cardiovascular: Normal rate, regular rhythm and normal heart sounds.   Pulmonary/Chest: Effort normal. She has decreased breath sounds.  Neurological: She is alert and oriented to person, place, and time.  Psychiatric: She has a normal mood and affect. Her behavior is normal. Judgment and thought content normal.      Assessment & Plan:     1. Bronchitis Improved. Finish prednisone taper.  - Spirometry with Graph     2. Chronic  obstructive pulmonary disease, unspecified COPD type (HCC) New diagnosis. Patient is motivated to continue not smoking. Did smoke once yesterday but not today. Will consider gum if needed.   Patient instructed to call back if condition worsens or does not improve.     Lorie Phenix, MD  [facility]

## 2015-06-26 DIAGNOSIS — Z5181 Encounter for therapeutic drug level monitoring: Secondary | ICD-10-CM | POA: Diagnosis not present

## 2015-06-26 DIAGNOSIS — Z79899 Other long term (current) drug therapy: Secondary | ICD-10-CM | POA: Diagnosis not present

## 2015-06-26 DIAGNOSIS — G894 Chronic pain syndrome: Secondary | ICD-10-CM | POA: Diagnosis not present

## 2015-06-26 DIAGNOSIS — Z9689 Presence of other specified functional implants: Secondary | ICD-10-CM | POA: Diagnosis not present

## 2015-06-26 DIAGNOSIS — M129 Arthropathy, unspecified: Secondary | ICD-10-CM | POA: Diagnosis not present

## 2015-07-26 ENCOUNTER — Other Ambulatory Visit: Payer: Self-pay | Admitting: Family Medicine

## 2015-07-26 DIAGNOSIS — E039 Hypothyroidism, unspecified: Secondary | ICD-10-CM

## 2015-08-02 ENCOUNTER — Other Ambulatory Visit: Payer: Self-pay

## 2015-08-02 MED ORDER — CLINDAMYCIN HCL 150 MG PO CAPS: ORAL_CAPSULE | ORAL | Status: DC

## 2015-09-26 DIAGNOSIS — Z79899 Other long term (current) drug therapy: Secondary | ICD-10-CM | POA: Diagnosis not present

## 2015-09-26 DIAGNOSIS — M129 Arthropathy, unspecified: Secondary | ICD-10-CM | POA: Diagnosis not present

## 2015-09-26 DIAGNOSIS — G894 Chronic pain syndrome: Secondary | ICD-10-CM | POA: Diagnosis not present

## 2015-09-26 DIAGNOSIS — Z9689 Presence of other specified functional implants: Secondary | ICD-10-CM | POA: Diagnosis not present

## 2015-10-23 ENCOUNTER — Other Ambulatory Visit: Payer: Self-pay | Admitting: Family Medicine

## 2015-10-23 DIAGNOSIS — I10 Essential (primary) hypertension: Secondary | ICD-10-CM

## 2015-11-29 DIAGNOSIS — M722 Plantar fascial fibromatosis: Secondary | ICD-10-CM | POA: Diagnosis not present

## 2015-11-29 DIAGNOSIS — M79671 Pain in right foot: Secondary | ICD-10-CM | POA: Diagnosis not present

## 2015-12-20 DIAGNOSIS — M722 Plantar fascial fibromatosis: Secondary | ICD-10-CM | POA: Diagnosis not present

## 2015-12-20 DIAGNOSIS — M79671 Pain in right foot: Secondary | ICD-10-CM | POA: Diagnosis not present

## 2015-12-25 DIAGNOSIS — Z9689 Presence of other specified functional implants: Secondary | ICD-10-CM | POA: Diagnosis not present

## 2015-12-25 DIAGNOSIS — G894 Chronic pain syndrome: Secondary | ICD-10-CM | POA: Diagnosis not present

## 2015-12-25 DIAGNOSIS — M1610 Unilateral primary osteoarthritis, unspecified hip: Secondary | ICD-10-CM | POA: Diagnosis not present

## 2015-12-25 DIAGNOSIS — Z79899 Other long term (current) drug therapy: Secondary | ICD-10-CM | POA: Diagnosis not present

## 2016-02-23 ENCOUNTER — Other Ambulatory Visit: Payer: Self-pay | Admitting: Family Medicine

## 2016-02-23 DIAGNOSIS — E039 Hypothyroidism, unspecified: Secondary | ICD-10-CM

## 2016-02-23 NOTE — Telephone Encounter (Signed)
Last ov 04/10/15 Last filled 07/26/15. Please review. Thank you. sd

## 2016-02-29 ENCOUNTER — Other Ambulatory Visit: Payer: Self-pay

## 2016-02-29 DIAGNOSIS — E039 Hypothyroidism, unspecified: Secondary | ICD-10-CM

## 2016-02-29 MED ORDER — LEVOTHYROXINE SODIUM 88 MCG PO TABS: 88.0000 ug | ORAL_TABLET | Freq: Every day | ORAL | 1 refills | Status: DC

## 2016-02-29 NOTE — Telephone Encounter (Signed)
[id number] 

## 2016-02-29 NOTE — Telephone Encounter (Signed)
May need to check with patient to see if they require brand name. Have she tried levothyroxine before and failed?

## 2016-02-29 NOTE — Telephone Encounter (Signed)
Pharmacy requesting changing brand name for levothyroxine 88 mcg. Please review. Thank you. sd

## 2016-02-29 NOTE — Telephone Encounter (Signed)
Patient states she has been taking generic and did not request change to brand name just needs her regular generic medication refilled.-aa

## 2016-03-03 ENCOUNTER — Telehealth: Payer: Self-pay | Admitting: Physician Assistant

## 2016-03-05 ENCOUNTER — Ambulatory Visit (INDEPENDENT_AMBULATORY_CARE_PROVIDER_SITE_OTHER): Payer: BLUE CROSS/BLUE SHIELD | Admitting: Physician Assistant

## 2016-03-05 ENCOUNTER — Encounter: Payer: Self-pay | Admitting: Physician Assistant

## 2016-03-05 VITALS — BP 100/66 | HR 90 | Temp 98.2°F | Resp 16 | Wt 158.0 lb

## 2016-03-05 DIAGNOSIS — E041 Nontoxic single thyroid nodule: Secondary | ICD-10-CM | POA: Diagnosis not present

## 2016-03-05 DIAGNOSIS — R252 Cramp and spasm: Secondary | ICD-10-CM

## 2016-03-05 DIAGNOSIS — Z1231 Encounter for screening mammogram for malignant neoplasm of breast: Secondary | ICD-10-CM

## 2016-03-05 DIAGNOSIS — E7800 Pure hypercholesterolemia, unspecified: Secondary | ICD-10-CM | POA: Diagnosis not present

## 2016-03-05 DIAGNOSIS — I10 Essential (primary) hypertension: Secondary | ICD-10-CM

## 2016-03-05 DIAGNOSIS — E039 Hypothyroidism, unspecified: Secondary | ICD-10-CM | POA: Diagnosis not present

## 2016-03-05 DIAGNOSIS — Z1239 Encounter for other screening for malignant neoplasm of breast: Secondary | ICD-10-CM

## 2016-03-05 MED ORDER — TRIAMTERENE-HCTZ 37.5-25 MG PO CAPS: 1.0000 | ORAL_CAPSULE | Freq: Every day | ORAL | 1 refills | Status: DC

## 2016-03-05 NOTE — Patient Instructions (Signed)
Hypothyroidism Hypothyroidism is a disorder of the thyroid. The thyroid is a large gland that is located in the lower front of the neck. The thyroid releases hormones that control how the body works. With hypothyroidism, the thyroid does not make enough of these hormones. What are the causes? Causes of hypothyroidism may include:  Viral infections.  Pregnancy.  Your own defense system (immune system) attacking your thyroid.  Certain medicines.  Birth defects.  Past radiation treatments to your head or neck.  Past treatment with radioactive iodine.  Past surgical removal of part or all of your thyroid.  Problems with the gland that is located in the center of your brain (pituitary).  What are the signs or symptoms? Signs and symptoms of hypothyroidism may include:  Feeling as though you have no energy (lethargy).  Inability to tolerate cold.  Weight gain that is not explained by a change in diet or exercise habits.  Dry skin.  Coarse hair.  Menstrual irregularity.  Slowing of thought processes.  Constipation.  Sadness or depression.  How is this diagnosed? Your health care provider may diagnose hypothyroidism with blood tests and ultrasound tests. How is this treated? Hypothyroidism is treated with medicine that replaces the hormones that your body does not make. After you begin treatment, it may take several weeks for symptoms to go away. Follow these instructions at home:  Take medicines only as directed by your health care provider.  If you start taking any new medicines, tell your health care provider.  Keep all follow-up visits as directed by your health care provider. This is important. As your condition improves, your dosage needs may change. You will need to have blood tests regularly so that your health care provider can watch your condition. Contact a health care provider if:  Your symptoms do not get better with treatment.  You are taking thyroid  replacement medicine and: ? You sweat excessively. ? You have tremors. ? You feel anxious. ? You lose weight rapidly. ? You cannot tolerate heat. ? You have emotional swings. ? You have diarrhea. ? You feel weak. Get help right away if:  You develop chest pain.  You develop an irregular heartbeat.  You develop a rapid heartbeat. This information is not intended to replace advice given to you by your health care provider. Make sure you discuss any questions you have with your health care provider. Document Released: 01/27/2005 Document Revised: 07/05/2015 Document Reviewed: 06/14/2013 Elsevier Interactive Patient Education  2017 [employer]  

## 2016-03-05 NOTE — Progress Notes (Signed)
[name] Female    DOB: 1965/05/27   51 y.o.   MRN: [id number] Visit Date: 03/05/2016  Today's Provider: Margaretann Loveless, PA-C   Chief Complaint  Patient presents with  . Hypertension  . Hypothyroidism   Subjective:    HPI  Hypertension, follow-up:  BP Readings from Last 3 Encounters:  03/05/16 100/66  04/10/15 100/68  04/05/15 126/81    She was last seen for hypertension 11 months ago.  BP at that visit was 100/68. Management changes since that visit include no changes. She reports excellent compliance with treatment. She is not having side effects.  She is exercising. She is not adherent to low salt diet.   Outside blood pressures are stable. She is experiencing none.  Patient denies chest pain and lower extremity edema.   Cardiovascular risk factors include hypertension and smoking/ tobacco exposure.  Use of agents associated with hypertension: none.     Weight trend: increasing steadily Wt Readings from Last 3 Encounters:  03/05/16 158 lb (71.7 kg)  04/10/15 155 lb (70.3 kg)  04/05/15 150 lb (68 kg)    Current diet: in general, a "healthy" diet    ------------------------------------------------------------------------   Hypothyroid, follow-up:  TSH  Date Value Ref Range Status  10/18/2014 1.550 0.450 - 4.500 uIU/mL Final  05/23/2014 0.33 (A) 0.41 - 5.90 uIU/mL Final   Wt Readings from Last 3 Encounters:  03/05/16 158 lb (71.7 kg)  04/10/15 155 lb (70.3 kg)  04/05/15 150 lb (68 kg)    She was last seen for hypothyroid 11 months ago.  Management since that visit includes no changes. She reports excellent compliance with treatment. She is not having side effects.  She is exercising. She is experiencing none She denies change in energy level, nervousness and palpitations Weight trend: increasing steadily  ------------------------------------------------------------------------ Patient c/o cramps in feet times several  months pt reports symptoms are worsening.      Allergies  Allergen Reactions  . Morphine Itching and Nausea And Vomiting  . Penicillins Rash     Current Outpatient Prescriptions:  .  HYDROcodone-acetaminophen (NORCO/VICODIN) 5-325 MG tablet, Take by mouth. 1 tablet 3 times a day as needed for pain, Disp: , Rfl:  .  ibuprofen (ADVIL,MOTRIN) 200 MG tablet, Take 200 mg by mouth every 6 (six) hours as needed., Disp: , Rfl:  .  levothyroxine (SYNTHROID, LEVOTHROID) 88 MCG tablet, Take 1 tablet (88 mcg total) by mouth daily., Disp: 90 tablet, Rfl: 1 .  PAIN MANAGEMENT IT PUMP REFILL, 1 each by Intrathecal route once., Disp: , Rfl:  .  triamterene-hydrochlorothiazide (DYAZIDE) 37.5-25 MG capsule, TAKE ONE CAPSULE BY MOUTH ONCE DAILY, Disp: 90 capsule, Rfl: 1  Review of Systems  Constitutional: Negative.   Respiratory: Negative.   Cardiovascular: Negative.   Gastrointestinal: Negative.   Endocrine: Negative.   Musculoskeletal:       Cramps on feet  Neurological: Negative.     Social History  Substance Use Topics  . Smoking status: Current Every Day Smoker    Packs/day: 0.50  . Smokeless tobacco: Never Used  . Alcohol use No   Objective:   BP 100/66 (BP Location: Left Arm, Patient Position: Sitting, Cuff Size: Large)   Pulse 90   Temp 98.2 F (36.8 C) (Oral)   Resp 16   Wt 158 lb (71.7 kg)   SpO2 98%   BMI 27.12 kg/m   Physical Exam  Constitutional: She appears well-developed and well-nourished. No distress.  Neck: Normal range of motion. Neck supple. No JVD present. No tracheal deviation present. Thyromegaly (possible thyroid nodule (very small) felt in left upper lobe) present.  Cardiovascular: Normal rate, regular rhythm and normal heart sounds.  Exam reveals no gallop and no friction rub.   No murmur heard. Pulmonary/Chest: Effort normal and breath sounds normal. No respiratory distress. She has no wheezes. She has no rales.  Lymphadenopathy:    She has no cervical  adenopathy.  Skin: She is not diaphoretic.  Vitals reviewed.      Assessment & Plan:     1. Acquired hypothyroidism Will check labs as below and obtain US for possible nodule. Will follow up pending results.  *Addend: TSH abnormal. Levothyroxine increased to 100mcg from 88mcg. US revealed 2 small nodules that do not require follow up.  - US Soft Tissue Head/Neck; Future - Thyroid Panel With TSH - levothyroxine (SYNTHROID, LEVOTHROID) 100 MCG tablet; Take 1 tablet (100 mcg total) by mouth daily.  Dispense: 90 tablet; Refill: 1  2. Essential (primary) hypertension Stable. Diagnosis pulled for medication refill. Continue current medical treatment plan. Will check labs as below and f/u pending results. - triamterene-hydrochlorothiazide (DYAZIDE) 37.5-25 MG capsule; Take 1 each (1 capsule total) by mouth daily.  Dispense: 90 capsule; Refill: 1 - CBC w/Diff/Platelet - Comprehensive Metabolic Panel (CMET)  3. Hypercholesteremia Will check labs as below and f/u pending results. - CBC w/Diff/Platelet - Comprehensive Metabolic Panel (CMET) - Lipid Profile  4. Thyroid nodule See above medical treatment plan for #1. - US Soft Tissue Head/Neck; Future - Thyroid Panel With TSH  5. Breast cancer screening There is no family history of breast cancer. She does perform regular self breast exams. Mammogram was ordered as below. [facility] so she may schedule her mammogram at her convenience. - MM Digital Screening; Future  6. Muscle cramp, nocturnal Will check labs as below and f/u pending results. Suspect neurogenic claudication from chronic back issues.  - Vitamin D (25 hydroxy) - B12 - Magnesium       Margaretann LovelessJennifer M Peggi Yono, PA-C  [facility]

## 2016-03-06 LAB — THYROID PANEL WITH TSH
FREE THYROXINE INDEX: 2.4 (ref 1.2–4.9)
T3 UPTAKE RATIO: 26 % (ref 24–39)
T4 TOTAL: 9.4 ug/dL (ref 4.5–12.0)
TSH: 9.43 u[IU]/mL — AB (ref 0.450–4.500)

## 2016-03-06 LAB — CBC WITH DIFFERENTIAL/PLATELET
BASOS ABS: 0 10*3/uL (ref 0.0–0.2)
Basos: 1 %
EOS (ABSOLUTE): 0.2 10*3/uL (ref 0.0–0.4)
Eos: 2 %
HEMOGLOBIN: 16.4 g/dL — AB (ref 11.1–15.9)
Hematocrit: 46.8 % — ABNORMAL HIGH (ref 34.0–46.6)
IMMATURE GRANS (ABS): 0 10*3/uL (ref 0.0–0.1)
Immature Granulocytes: 0 %
LYMPHS: 26 %
Lymphocytes Absolute: 2.2 10*3/uL (ref 0.7–3.1)
MCH: 30.8 pg (ref 26.6–33.0)
MCHC: 35 g/dL (ref 31.5–35.7)
MCV: 88 fL (ref 79–97)
MONOCYTES: 7 %
Monocytes Absolute: 0.6 10*3/uL (ref 0.1–0.9)
Neutrophils Absolute: 5.5 10*3/uL (ref 1.4–7.0)
Neutrophils: 64 %
PLATELETS: 234 10*3/uL (ref 150–379)
RBC: 5.32 x10E6/uL — AB (ref 3.77–5.28)
RDW: 14.1 % (ref 12.3–15.4)
WBC: 8.5 10*3/uL (ref 3.4–10.8)

## 2016-03-06 LAB — MAGNESIUM: Magnesium: 2.1 mg/dL (ref 1.6–2.3)

## 2016-03-06 LAB — LIPID PANEL
CHOL/HDL RATIO: 4.9 ratio — AB (ref 0.0–4.4)
CHOLESTEROL TOTAL: 173 mg/dL (ref 100–199)
HDL: 35 mg/dL — AB (ref >39)
LDL Calculated: 108 mg/dL — ABNORMAL HIGH (ref 0–99)
TRIGLYCERIDES: 152 mg/dL — AB (ref 0–149)
VLDL Cholesterol Cal: 30 mg/dL (ref 5–40)

## 2016-03-06 LAB — COMPREHENSIVE METABOLIC PANEL
ALBUMIN: 4.8 g/dL (ref 3.5–5.5)
ALK PHOS: 85 IU/L (ref 39–117)
ALT: 19 IU/L (ref 0–32)
AST: 15 IU/L (ref 0–40)
Albumin/Globulin Ratio: 1.8 (ref 1.2–2.2)
BUN / CREAT RATIO: 12 (ref 9–23)
BUN: 18 mg/dL (ref 6–24)
Bilirubin Total: 0.5 mg/dL (ref 0.0–1.2)
CO2: 26 mmol/L (ref 18–29)
CREATININE: 1.51 mg/dL — AB (ref 0.57–1.00)
Calcium: 9.8 mg/dL (ref 8.7–10.2)
Chloride: 96 mmol/L (ref 96–106)
GFR calc Af Amer: 45 mL/min/{1.73_m2} — ABNORMAL LOW (ref >59)
GFR calc non Af Amer: 39 mL/min/{1.73_m2} — ABNORMAL LOW (ref >59)
GLUCOSE: 90 mg/dL (ref 65–99)
Globulin, Total: 2.6 g/dL (ref 1.5–4.5)
Potassium: 3.8 mmol/L (ref 3.5–5.2)
Sodium: 138 mmol/L (ref 134–144)
TOTAL PROTEIN: 7.4 g/dL (ref 6.0–8.5)

## 2016-03-06 LAB — VITAMIN B12: Vitamin B-12: 638 pg/mL (ref 232–1245)

## 2016-03-06 LAB — VITAMIN D 25 HYDROXY (VIT D DEFICIENCY, FRACTURES): VIT D 25 HYDROXY: 23.1 ng/mL — AB (ref 30.0–100.0)

## 2016-03-11 ENCOUNTER — Ambulatory Visit
Admission: RE | Admit: 2016-03-11 | Discharge: 2016-03-11 | Disposition: A | Discharge status: Home / Self Care | Payer: BLUE CROSS/BLUE SHIELD | Source: Ambulatory Visit | Attending: Physician Assistant | Admitting: Physician Assistant

## 2016-03-11 DIAGNOSIS — E039 Hypothyroidism, unspecified: Secondary | ICD-10-CM

## 2016-03-11 DIAGNOSIS — E042 Nontoxic multinodular goiter: Secondary | ICD-10-CM | POA: Diagnosis not present

## 2016-03-11 DIAGNOSIS — E041 Nontoxic single thyroid nodule: Secondary | ICD-10-CM | POA: Diagnosis present

## 2016-03-11 MED ORDER — LEVOTHYROXINE SODIUM 100 MCG PO TABS: 100.0000 ug | ORAL_TABLET | Freq: Every day | ORAL | 1 refills | Status: DC

## 2016-03-12 ENCOUNTER — Telehealth: Payer: Self-pay

## 2016-03-12 NOTE — Telephone Encounter (Signed)
-----   Message from Margaretann LovelessJennifer M Burnette, [place]PA-C sent at 03/11/2016  7:13 PM EST ----- LDL and triglycerides are elevated. Work on lifestyle modifications including healthy dieting and increasing physical activity. TSH is abnormal and may require increasing your levothyroxine dose. We will increase dose and recheck in 8-12 weeks. Vit D level is low as well. Start a vit D supplement OTC of 1000-2000 IU daily. We will recheck when we check labs for thyroid again as well.

## 2016-03-12 NOTE — Telephone Encounter (Signed)
Patient advised as directed below. Patient verbalized understanding.  Thanks,  -Joseline 

## 2016-03-19 DIAGNOSIS — M1610 Unilateral primary osteoarthritis, unspecified hip: Secondary | ICD-10-CM | POA: Diagnosis not present

## 2016-03-19 DIAGNOSIS — Z9689 Presence of other specified functional implants: Secondary | ICD-10-CM | POA: Diagnosis not present

## 2016-03-19 DIAGNOSIS — G894 Chronic pain syndrome: Secondary | ICD-10-CM | POA: Diagnosis not present

## 2016-03-19 DIAGNOSIS — Z791 Long term (current) use of non-steroidal anti-inflammatories (NSAID): Secondary | ICD-10-CM | POA: Diagnosis not present

## 2016-06-04 DIAGNOSIS — Z9689 Presence of other specified functional implants: Secondary | ICD-10-CM | POA: Diagnosis not present

## 2016-06-04 DIAGNOSIS — Z79899 Other long term (current) drug therapy: Secondary | ICD-10-CM | POA: Diagnosis not present

## 2016-06-04 DIAGNOSIS — M1610 Unilateral primary osteoarthritis, unspecified hip: Secondary | ICD-10-CM | POA: Diagnosis not present

## 2016-06-04 DIAGNOSIS — G894 Chronic pain syndrome: Secondary | ICD-10-CM | POA: Diagnosis not present

## 2016-06-11 ENCOUNTER — Telehealth: Payer: Self-pay | Admitting: Physician Assistant

## 2016-06-11 NOTE — Telephone Encounter (Signed)
Called Pt to schedule AWV with [facility] - knb °

## 2016-07-10 DIAGNOSIS — T8484XD Pain due to internal orthopedic prosthetic devices, implants and grafts, subsequent encounter: Secondary | ICD-10-CM | POA: Diagnosis not present

## 2016-07-10 DIAGNOSIS — Z96641 Presence of right artificial hip joint: Secondary | ICD-10-CM | POA: Diagnosis not present

## 2016-07-11 ENCOUNTER — Other Ambulatory Visit: Payer: Self-pay | Admitting: Orthopedic Surgery

## 2016-07-11 DIAGNOSIS — Z96641 Presence of right artificial hip joint: Secondary | ICD-10-CM

## 2016-07-29 DIAGNOSIS — Z96641 Presence of right artificial hip joint: Secondary | ICD-10-CM | POA: Diagnosis not present

## 2016-08-05 ENCOUNTER — Encounter
Admission: RE | Admit: 2016-08-05 | Discharge: 2016-08-05 | Disposition: A | Discharge status: Home / Self Care | Payer: BLUE CROSS/BLUE SHIELD | Source: Ambulatory Visit | Attending: Orthopedic Surgery | Admitting: Orthopedic Surgery

## 2016-08-05 DIAGNOSIS — M25551 Pain in right hip: Secondary | ICD-10-CM | POA: Diagnosis not present

## 2016-08-05 DIAGNOSIS — Z96641 Presence of right artificial hip joint: Secondary | ICD-10-CM | POA: Insufficient documentation

## 2016-08-05 MED ORDER — TECHNETIUM TC 99M MEDRONATE IV KIT
25.0000 | PACK | Freq: Once | INTRAVENOUS | Status: AC | PRN
  Administered 2016-08-05: 23.53 via INTRAVENOUS

## 2016-08-14 DIAGNOSIS — T8484XD Pain due to internal orthopedic prosthetic devices, implants and grafts, subsequent encounter: Secondary | ICD-10-CM | POA: Diagnosis not present

## 2016-08-14 DIAGNOSIS — Z96641 Presence of right artificial hip joint: Secondary | ICD-10-CM | POA: Diagnosis not present

## 2016-08-19 ENCOUNTER — Telehealth: Payer: Self-pay | Admitting: Physician Assistant

## 2016-08-20 DIAGNOSIS — G894 Chronic pain syndrome: Secondary | ICD-10-CM | POA: Diagnosis not present

## 2016-08-20 DIAGNOSIS — M1610 Unilateral primary osteoarthritis, unspecified hip: Secondary | ICD-10-CM | POA: Diagnosis not present

## 2016-08-20 DIAGNOSIS — Z9689 Presence of other specified functional implants: Secondary | ICD-10-CM | POA: Diagnosis not present

## 2016-08-20 DIAGNOSIS — Z5181 Encounter for therapeutic drug level monitoring: Secondary | ICD-10-CM | POA: Diagnosis not present

## 2016-08-20 DIAGNOSIS — Z79899 Other long term (current) drug therapy: Secondary | ICD-10-CM | POA: Diagnosis not present

## 2016-09-03 ENCOUNTER — Ambulatory Visit (INDEPENDENT_AMBULATORY_CARE_PROVIDER_SITE_OTHER): Payer: BLUE CROSS/BLUE SHIELD | Admitting: Physician Assistant

## 2016-09-03 ENCOUNTER — Encounter: Payer: Self-pay | Admitting: Physician Assistant

## 2016-09-03 VITALS — BP 110/60 | HR 84 | Temp 97.8°F | Resp 16 | Wt 155.0 lb

## 2016-09-03 DIAGNOSIS — E7800 Pure hypercholesterolemia, unspecified: Secondary | ICD-10-CM | POA: Diagnosis not present

## 2016-09-03 DIAGNOSIS — Z6826 Body mass index (BMI) 26.0-26.9, adult: Secondary | ICD-10-CM

## 2016-09-03 DIAGNOSIS — I10 Essential (primary) hypertension: Secondary | ICD-10-CM

## 2016-09-03 DIAGNOSIS — E559 Vitamin D deficiency, unspecified: Secondary | ICD-10-CM

## 2016-09-03 DIAGNOSIS — J449 Chronic obstructive pulmonary disease, unspecified: Secondary | ICD-10-CM | POA: Diagnosis not present

## 2016-09-03 DIAGNOSIS — E039 Hypothyroidism, unspecified: Secondary | ICD-10-CM | POA: Diagnosis not present

## 2016-09-03 NOTE — Progress Notes (Signed)
[name] Female    DOB: August 13, 1965   51 y.o.   MRN: [id number] Visit Date: 09/03/2016  Today's Provider: Margaretann LovelessJennifer M Lamarkus Nebel, PA-C   Chief Complaint  Patient presents with  . Follow-up    HTN,TSH,HDL   Subjective:    HPI  Hypertension, follow-up:  BP Readings from Last 3 Encounters:  09/03/16 110/60  03/05/16 100/66  04/10/15 100/68    She was last seen for hypertension 6 months ago.  BP at that visit was 100/68. Management since that visit includes none. She reports excellent compliance with treatment. She is not having side effects.  She is exercising. She is not adherent to low salt diet.   Outside blood pressures are stable. She is experiencing none.  Patient denies chest pain, exertional chest pressure/discomfort, fatigue, irregular heart beat, lower extremity edema, near-syncope and palpitations.   Cardiovascular risk factors include dyslipidemia, hypertension and smoking/ tobacco exposure.  Use of agents associated with hypertension: none.     Weight trend: stable Wt Readings from Last 3 Encounters:  09/03/16 155 lb (70.3 kg)  03/05/16 158 lb (71.7 kg)  04/10/15 155 lb (70.3 kg)    Current diet: in general, a "healthy" diet    ------------------------------------------------------------------------  Hypothyroid, follow-up:  TSH  Date Value Ref Range Status  03/05/2016 9.430 (H) 0.450 - 4.500 uIU/mL Final  10/18/2014 1.550 0.450 - 4.500 uIU/mL Final  05/23/2014 0.33 (A) 0.41 - 5.90 uIU/mL Final   Wt Readings from Last 3 Encounters:  09/03/16 155 lb (70.3 kg)  03/05/16 158 lb (71.7 kg)  04/10/15 155 lb (70.3 kg)    She was last seen for hypothyroid 6 months ago.  Management since that visit includes increase Levothyroxine 100 mcg to replace 88 mcg. She reports excellent compliance with treatment. She is not having side effects.  She is exercising. She is experiencing none She does have "hot flashes" She denies change in  energy level, diarrhea, nervousness and palpitations Weight trend: stable  ------------------------------------------------------------------------  Lipid/Cholesterol, Follow-up:   Last seen for this 6 months ago.  Management changes since that visit include Work on lifestyle modifications including healthy dieting and increasing physical activity. . Last Lipid Panel:    Component Value Date/Time   CHOL 173 03/05/2016 1510   TRIG 152 (H) 03/05/2016 1510   HDL 35 (L) 03/05/2016 1510   CHOLHDL 4.9 (H) 03/05/2016 1510   LDLCALC 108 (H) 03/05/2016 1510    Risk factors for vascular disease include hypercholesterolemia, hypertension and smoking  Current symptoms include none and have been stable.  Vitamin D: Vitamin D level was low on labs 03/05/16.She reports that she is feeling better since she started taking the Vitamin D.   Allergies  Allergen Reactions  . Morphine Itching and Nausea And Vomiting  . Penicillins Rash     Current Outpatient Prescriptions:  .  HYDROcodone-acetaminophen (NORCO/VICODIN) 5-325 MG tablet, Take by mouth. 1 tablet 3 times a day as needed for pain, Disp: , Rfl:  .  ibuprofen (ADVIL,MOTRIN) 200 MG tablet, Take 200 mg by mouth every 6 (six) hours as needed., Disp: , Rfl:  .  levothyroxine (SYNTHROID, LEVOTHROID) 100 MCG tablet, Take 1 tablet (100 mcg total) by mouth daily., Disp: 90 tablet, Rfl: 1 .  PAIN MANAGEMENT IT PUMP REFILL, 1 each by Intrathecal route once., Disp: , Rfl:  .  triamterene-hydrochlorothiazide (DYAZIDE) 37.5-25 MG capsule, Take 1 each (1 capsule total) by mouth daily., Disp: 90 capsule, Rfl: 1  Review of Systems  Constitutional: Positive for diaphoresis (menopausal). Negative for activity change, appetite change and fatigue.  Eyes: Negative for visual disturbance.  Cardiovascular: Negative for chest pain, palpitations and leg swelling.  Gastrointestinal: Negative for abdominal pain, constipation, diarrhea, nausea and vomiting.    Neurological: Negative for dizziness, light-headedness and headaches.    Social History  Substance Use Topics  . Smoking status: Current Some Day Smoker    Packs/day: 0.50  . Smokeless tobacco: Never Used  . Alcohol use No   Objective:   BP 110/60 (BP Location: Left Arm, Patient Position: Sitting, Cuff Size: Normal)   Pulse 84   Temp 97.8 F (36.6 C) (Oral)   Resp 16   Wt 155 lb (70.3 kg)   BMI 26.61 kg/m    Physical Exam  Constitutional: She appears well-developed and well-nourished. No distress.  Neck: Normal range of motion. Neck supple. No tracheal deviation present. No thyromegaly present.  Cardiovascular: Normal rate, regular rhythm and normal heart sounds.  Exam reveals no gallop and no friction rub.   No murmur heard. Pulmonary/Chest: Effort normal and breath sounds normal. No respiratory distress. She has no wheezes. She has no rales.  Lymphadenopathy:    She has no cervical adenopathy.  Skin: She is not diaphoretic.  Psychiatric: She has a normal mood and affect. Her behavior is normal. Judgment and thought content normal.  Vitals reviewed.       Assessment & Plan:     1. Essential (primary) hypertension Stable. Continue Triamterene-HCTZ 37.5-25mg .   2. Acquired hypothyroidism Improved symptoms. Will check labs as below and f/u pending results. Continue levothyroxine 100mcg if labs are normal.   *TSH normal, continue levothyroxine 100mcg. - TSH - levothyroxine (SYNTHROID, LEVOTHROID) 100 MCG tablet; Take 1 tablet (100 mcg total) by mouth daily.  Dispense: 90 tablet; Refill: 1  3. Hypercholesteremia Stable. Will check labs as below and f/u pending results. - Lipid panel  4. Vitamin D deficiency H/O this. Does use OTC supplementation but not currently on. Will check labs as below and f/u pending results.  *Borderline low-restart OTC supplement. - Vitamin D (25 hydroxy)  5. BMI 26.0-26.9,adult Counseled patient on healthy lifestyle modifications  including dieting and exercise.   6. Chronic obstructive pulmonary disease, unspecified COPD type (HCC) Stable. No inhalers needed. No exacerbations.       Margaretann LovelessJennifer M Tonia Avino, PA-C  [facility]

## 2016-09-04 LAB — TSH: TSH: 1.94 u[IU]/mL (ref 0.450–4.500)

## 2016-09-04 LAB — LIPID PANEL
Chol/HDL Ratio: 3.7 ratio (ref 0.0–4.4)
Cholesterol, Total: 145 mg/dL (ref 100–199)
HDL: 39 mg/dL — ABNORMAL LOW (ref >39)
LDL Calculated: 84 mg/dL (ref 0–99)
Triglycerides: 111 mg/dL (ref 0–149)
VLDL CHOLESTEROL CAL: 22 mg/dL (ref 5–40)

## 2016-09-04 LAB — VITAMIN D 25 HYDROXY (VIT D DEFICIENCY, FRACTURES): Vit D, 25-Hydroxy: 37.9 ng/mL (ref 30.0–100.0)

## 2016-09-04 MED ORDER — LEVOTHYROXINE SODIUM 100 MCG PO TABS: 100.0000 ug | ORAL_TABLET | Freq: Every day | ORAL | 1 refills | Status: DC

## 2016-10-19 ENCOUNTER — Other Ambulatory Visit: Payer: Self-pay | Admitting: Physician Assistant

## 2016-10-19 DIAGNOSIS — I10 Essential (primary) hypertension: Secondary | ICD-10-CM

## 2016-10-29 DIAGNOSIS — Z791 Long term (current) use of non-steroidal anti-inflammatories (NSAID): Secondary | ICD-10-CM | POA: Diagnosis not present

## 2016-10-29 DIAGNOSIS — G894 Chronic pain syndrome: Secondary | ICD-10-CM | POA: Diagnosis not present

## 2016-10-29 DIAGNOSIS — Z9689 Presence of other specified functional implants: Secondary | ICD-10-CM | POA: Diagnosis not present

## 2016-10-29 DIAGNOSIS — M1610 Unilateral primary osteoarthritis, unspecified hip: Secondary | ICD-10-CM | POA: Diagnosis not present

## 2016-12-22 ENCOUNTER — Telehealth: Payer: Self-pay | Admitting: Physician Assistant

## 2017-01-07 DIAGNOSIS — Z79899 Other long term (current) drug therapy: Secondary | ICD-10-CM | POA: Diagnosis not present

## 2017-01-07 DIAGNOSIS — G894 Chronic pain syndrome: Secondary | ICD-10-CM | POA: Diagnosis not present

## 2017-01-07 DIAGNOSIS — Z9689 Presence of other specified functional implants: Secondary | ICD-10-CM | POA: Diagnosis not present

## 2017-01-07 DIAGNOSIS — M1610 Unilateral primary osteoarthritis, unspecified hip: Secondary | ICD-10-CM | POA: Diagnosis not present

## 2017-01-13 ENCOUNTER — Telehealth: Payer: Self-pay | Admitting: Physician Assistant

## 2017-01-28 NOTE — Telephone Encounter (Signed)
Duplicate

## 2017-03-02 ENCOUNTER — Other Ambulatory Visit: Payer: Self-pay | Admitting: Physician Assistant

## 2017-03-02 DIAGNOSIS — E039 Hypothyroidism, unspecified: Secondary | ICD-10-CM

## 2017-03-04 ENCOUNTER — Telehealth: Payer: Self-pay | Admitting: Physician Assistant

## 2017-03-10 NOTE — Telephone Encounter (Signed)
duplicate

## 2017-03-18 DIAGNOSIS — Z791 Long term (current) use of non-steroidal anti-inflammatories (NSAID): Secondary | ICD-10-CM | POA: Diagnosis not present

## 2017-03-18 DIAGNOSIS — Z9689 Presence of other specified functional implants: Secondary | ICD-10-CM | POA: Diagnosis not present

## 2017-03-18 DIAGNOSIS — G894 Chronic pain syndrome: Secondary | ICD-10-CM | POA: Diagnosis not present

## 2017-03-18 DIAGNOSIS — G479 Sleep disorder, unspecified: Secondary | ICD-10-CM | POA: Diagnosis not present

## 2017-03-18 DIAGNOSIS — Z79899 Other long term (current) drug therapy: Secondary | ICD-10-CM | POA: Diagnosis not present

## 2017-03-18 DIAGNOSIS — M1610 Unilateral primary osteoarthritis, unspecified hip: Secondary | ICD-10-CM | POA: Diagnosis not present

## 2017-03-18 DIAGNOSIS — Z5181 Encounter for therapeutic drug level monitoring: Secondary | ICD-10-CM | POA: Diagnosis not present

## 2017-04-20 ENCOUNTER — Other Ambulatory Visit: Payer: Self-pay | Admitting: Physician Assistant

## 2017-04-20 DIAGNOSIS — I10 Essential (primary) hypertension: Secondary | ICD-10-CM

## 2017-04-27 NOTE — Telephone Encounter (Signed)
LMTCMB re scheduling initial MWV w [facility]

## 2017-05-20 DIAGNOSIS — M1610 Unilateral primary osteoarthritis, unspecified hip: Secondary | ICD-10-CM | POA: Diagnosis not present

## 2017-05-20 DIAGNOSIS — Z9689 Presence of other specified functional implants: Secondary | ICD-10-CM | POA: Diagnosis not present

## 2017-05-20 DIAGNOSIS — G894 Chronic pain syndrome: Secondary | ICD-10-CM | POA: Diagnosis not present

## 2017-05-20 DIAGNOSIS — G479 Sleep disorder, unspecified: Secondary | ICD-10-CM | POA: Diagnosis not present

## 2017-07-22 DIAGNOSIS — G479 Sleep disorder, unspecified: Secondary | ICD-10-CM | POA: Diagnosis not present

## 2017-07-22 DIAGNOSIS — Z9689 Presence of other specified functional implants: Secondary | ICD-10-CM | POA: Diagnosis not present

## 2017-07-22 DIAGNOSIS — Z791 Long term (current) use of non-steroidal anti-inflammatories (NSAID): Secondary | ICD-10-CM | POA: Diagnosis not present

## 2017-07-22 DIAGNOSIS — M1610 Unilateral primary osteoarthritis, unspecified hip: Secondary | ICD-10-CM | POA: Diagnosis not present

## 2017-07-22 DIAGNOSIS — G894 Chronic pain syndrome: Secondary | ICD-10-CM | POA: Diagnosis not present

## 2017-08-30 ENCOUNTER — Other Ambulatory Visit: Payer: Self-pay | Admitting: Physician Assistant

## 2017-08-30 DIAGNOSIS — E039 Hypothyroidism, unspecified: Secondary | ICD-10-CM

## 2017-08-31 NOTE — Telephone Encounter (Signed)
LMTCB to schedule a CPE  

## 2017-08-31 NOTE — Telephone Encounter (Signed)
Patient needs CPE 

## 2017-09-23 DIAGNOSIS — M1610 Unilateral primary osteoarthritis, unspecified hip: Secondary | ICD-10-CM | POA: Diagnosis not present

## 2017-09-23 DIAGNOSIS — Z791 Long term (current) use of non-steroidal anti-inflammatories (NSAID): Secondary | ICD-10-CM | POA: Diagnosis not present

## 2017-09-23 DIAGNOSIS — G894 Chronic pain syndrome: Secondary | ICD-10-CM | POA: Diagnosis not present

## 2017-09-23 DIAGNOSIS — Z9689 Presence of other specified functional implants: Secondary | ICD-10-CM | POA: Diagnosis not present

## 2017-10-05 ENCOUNTER — Telehealth: Payer: Self-pay | Admitting: Physician Assistant

## 2017-10-05 DIAGNOSIS — E7800 Pure hypercholesterolemia, unspecified: Secondary | ICD-10-CM

## 2017-10-05 DIAGNOSIS — E559 Vitamin D deficiency, unspecified: Secondary | ICD-10-CM

## 2017-10-05 DIAGNOSIS — E039 Hypothyroidism, unspecified: Secondary | ICD-10-CM

## 2017-10-05 DIAGNOSIS — I10 Essential (primary) hypertension: Secondary | ICD-10-CM

## 2017-10-05 DIAGNOSIS — D751 Secondary polycythemia: Secondary | ICD-10-CM

## 2017-10-05 DIAGNOSIS — J449 Chronic obstructive pulmonary disease, unspecified: Secondary | ICD-10-CM

## 2017-10-05 NOTE — Telephone Encounter (Signed)
Yes she does need CPE.  Labs ordered.

## 2017-10-05 NOTE — Telephone Encounter (Signed)
Does she need a physical?

## 2017-10-05 NOTE — Telephone Encounter (Signed)
Pt is scheduled for medication f/u appt tomorrow with Antony ContrasJenni at 1 pm. Pt stated that she is due for labs and can't wait all day to eat. Pt is requesting to get the labs ordered today if possible so pt can come in the morning to have her labs done and then return for the afternoon appt. Please advise. Thanks TNP

## 2017-10-05 NOTE — Telephone Encounter (Signed)
Patient advised.

## 2017-10-06 ENCOUNTER — Encounter: Payer: Self-pay | Admitting: Physician Assistant

## 2017-10-06 ENCOUNTER — Ambulatory Visit (INDEPENDENT_AMBULATORY_CARE_PROVIDER_SITE_OTHER): Payer: BLUE CROSS/BLUE SHIELD | Admitting: Physician Assistant

## 2017-10-06 VITALS — BP 112/60 | HR 100 | Temp 98.4°F | Resp 16 | Ht 64.0 in | Wt 151.6 lb

## 2017-10-06 DIAGNOSIS — E559 Vitamin D deficiency, unspecified: Secondary | ICD-10-CM | POA: Diagnosis not present

## 2017-10-06 DIAGNOSIS — Z1231 Encounter for screening mammogram for malignant neoplasm of breast: Secondary | ICD-10-CM

## 2017-10-06 DIAGNOSIS — Z6826 Body mass index (BMI) 26.0-26.9, adult: Secondary | ICD-10-CM

## 2017-10-06 DIAGNOSIS — D751 Secondary polycythemia: Secondary | ICD-10-CM | POA: Diagnosis not present

## 2017-10-06 DIAGNOSIS — E039 Hypothyroidism, unspecified: Secondary | ICD-10-CM

## 2017-10-06 DIAGNOSIS — J449 Chronic obstructive pulmonary disease, unspecified: Secondary | ICD-10-CM | POA: Diagnosis not present

## 2017-10-06 DIAGNOSIS — Z0000 Encounter for general adult medical examination without abnormal findings: Secondary | ICD-10-CM

## 2017-10-06 DIAGNOSIS — Z716 Tobacco abuse counseling: Secondary | ICD-10-CM

## 2017-10-06 DIAGNOSIS — I10 Essential (primary) hypertension: Secondary | ICD-10-CM | POA: Diagnosis not present

## 2017-10-06 DIAGNOSIS — F17209 Nicotine dependence, unspecified, with unspecified nicotine-induced disorders: Secondary | ICD-10-CM

## 2017-10-06 DIAGNOSIS — E7800 Pure hypercholesterolemia, unspecified: Secondary | ICD-10-CM | POA: Diagnosis not present

## 2017-10-06 DIAGNOSIS — Z1239 Encounter for other screening for malignant neoplasm of breast: Secondary | ICD-10-CM

## 2017-10-06 MED ORDER — BUPROPION HCL ER (SR) 150 MG PO TB12: 150.0000 mg | ORAL_TABLET | Freq: Two times a day (BID) | ORAL | 1 refills | Status: DC

## 2017-10-06 MED ORDER — LEVOTHYROXINE SODIUM 100 MCG PO TABS: 100.0000 ug | ORAL_TABLET | Freq: Every day | ORAL | 3 refills | Status: DC

## 2017-10-06 MED ORDER — TRIAMTERENE-HCTZ 37.5-25 MG PO CAPS: 1.0000 | ORAL_CAPSULE | Freq: Every day | ORAL | 3 refills | Status: DC

## 2017-10-06 NOTE — Progress Notes (Signed)
[name], Female    DOB: 25-Mar-1965, 52 y.o.   MRN: [id number] Visit Date: 10/06/2017  Today's Provider: Margaretann LovelessJennifer M Becci Batty, PA-C   Chief Complaint  Patient presents with  . Annual Exam   Subjective:    Annual physical exam Tasha [name] is a 52 y.o. female who presents today for health maintenance and complete physical. She feels well. She reports exercising some. She reports she is sleeping poorly.  FLU Vaccine: Patient Declined. Last CPE:03/15/15 Mammogram:02/09/2015 BI-RADS 1 NWG:[id number]:Hysterectomy in 2001 Colonoscopy:07/11/13 Recheck in 5 years.Dr.Wohl 2020 -----------------------------------------------------------------   Hypertension, follow-up:  BP Readings from Last 3 Encounters:  10/06/17 112/60  09/03/16 110/60  03/05/16 100/66    She was last seen for hypertension 13 months ago.  BP at that visit was 110/60. Management since that visit includes none.Continue Triamterene-HCTZ 37.5-25mg  She reports excellent compliance with treatment. She is not having side effects.  She is exercising. She is adherent to low salt diet.   Outside blood pressures are stable. She is experiencing none.  Patient denies chest pain, chest pressure/discomfort, claudication, exertional chest pressure/discomfort, fatigue, irregular heart beat, lower extremity edema, near-syncope and palpitations.   Cardiovascular risk factors include dyslipidemia, hypertension and smoking/ tobacco exposure.  Use of agents associated with hypertension: none.     Weight trend: stable Wt Readings from Last 3 Encounters:  10/06/17 151 lb 9.6 oz (68.8 kg)  09/03/16 155 lb (70.3 kg)  03/05/16 158 lb (71.7 kg)    Current diet: in general, a "healthy" diet    ------------------------------------------------------------------------  Lipid/Cholesterol, Follow-up:   Last seen for this1 years ago.  Management changes since that visit include LDL and triglycerides elevated. Work on  lifestyle modifications including healthy dieting and increasing physical activity. . Last Lipid Panel:    Component Value Date/Time   CHOL 145 09/03/2016 0925   TRIG 111 09/03/2016 0925   HDL 39 (L) 09/03/2016 0925   CHOLHDL 3.7 09/03/2016 0925   LDLCALC 84 09/03/2016 0925    Risk factors for vascular disease include hypercholesterolemia, hypertension and smoking  Current symptoms include none and have been stable. Current diet: in general, a "healthy" diet   Current exercise: walking  Wt Readings from Last 3 Encounters:  10/06/17 151 lb 9.6 oz (68.8 kg)  09/03/16 155 lb (70.3 kg)  03/05/16 158 lb (71.7 kg)    -------------------------------------------------------------------  Hypothyroid, follow-up:  TSH  Date Value Ref Range Status  09/03/2016 1.940 0.450 - 4.500 uIU/mL Final  03/05/2016 9.430 (H) 0.450 - 4.500 uIU/mL Final  10/18/2014 1.550 0.450 - 4.500 uIU/mL Final   Wt Readings from Last 3 Encounters:  10/06/17 151 lb 9.6 oz (68.8 kg)  09/03/16 155 lb (70.3 kg)  03/05/16 158 lb (71.7 kg)    She was last seen for hypothyroid 1 years ago.  Management since that visit includes increase Levothyroxine to 100mcg. She reports excellent compliance with treatment. She is not having side effects.  She is exercising. She is experiencing heat / cold intolerance She denies change in energy level, diarrhea, nervousness, palpitations and weight changes Weight trend: stable  ------------------------------------------------------------------------   Review of Systems  Constitutional: Negative.   HENT: Negative.   Eyes: Negative.   Respiratory: Negative.   Cardiovascular: Negative.   Gastrointestinal: Negative.   Endocrine: Negative.   Genitourinary: Negative.   Musculoskeletal: Positive for arthralgias, back pain, joint swelling and myalgias.  Skin: Negative.   Allergic/Immunologic: Positive for environmental allergies.  Neurological: Negative.  Hematological:  Negative.   Psychiatric/Behavioral: Positive for sleep disturbance.    Social History      She  reports that she has been smoking. She has been smoking about 0.25 packs per day. She has never used smokeless tobacco. She reports that she does not drink alcohol or use drugs.       Social History   Socioeconomic History  . Marital status: Married    Spouse name: Not on file  . Number of children: Not on file  . Years of education: Not on file  . Highest education level: Not on file  Occupational History  . Not on file  Social Needs  . Financial resource strain: Not on file  . Food insecurity:    Worry: Not on file    Inability: Not on file  . Transportation needs:    Medical: Not on file    Non-medical: Not on file  Tobacco Use  . Smoking status: Current Some Day Smoker    Packs/day: 0.25  . Smokeless tobacco: Never Used  Substance and Sexual Activity  . Alcohol use: No  . Drug use: No  . Sexual activity: Not on file  Lifestyle  . Physical activity:    Days per week: Not on file    Minutes per session: Not on file  . Stress: Not on file  Relationships  . Social connections:    Talks on phone: Not on file    Gets together: Not on file    Attends religious service: Not on file    Active member of club or organization: Not on file    Attends meetings of clubs or organizations: Not on file    Relationship status: Not on file  Other Topics Concern  . Not on file  Social History Narrative  . Not on file    Past Medical History:  Diagnosis Date  . Arthritis   . Chronic pain   . GERD (gastroesophageal reflux disease)   . Hypertension   . Thyroid disease      Patient Active Problem List   Diagnosis Date Noted  . Vitamin D deficiency 09/03/2016  . COPD (chronic obstructive pulmonary disease) (HCC) 04/03/2015  . Tobacco abuse counseling 03/15/2015  . Lymphadenopathy 01/29/2015  . Acquired hypothyroidism 10/17/2014  . Arthritis 10/17/2014  . Back pain, chronic  10/17/2014  . Clinical depression 10/17/2014  . Essential (primary) hypertension 10/17/2014  . Acid reflux 10/17/2014  . Hypercholesteremia 10/17/2014  . Cannot sleep 10/17/2014  . Headache, migraine 10/17/2014  . Acquired polycythemia 10/17/2014  . Compulsive tobacco user syndrome 10/17/2014  . Presence of polypropylene mesh in chest wall 06/01/2013  . Presence of other specified functional implants 06/01/2013  . Disorder of hip joint 07/29/2012  . Chronic pain associated with significant psychosocial dysfunction 05/06/2012    Past Surgical History:  Procedure Laterality Date  . ABDOMINAL HYSTERECTOMY  2001  . CESAREAN SECTION  1994  . JOINT REPLACEMENT Right 2005, 2010   hip  . PAIN PUMP IMPLANTATION  2008, 2014   Dr Court Joy  . TONSILLECTOMY  1970    Family History        Family Status  Relation Name Status  . Mother  Alive  . Father  Deceased  . Neg Hx  (Not Specified)        Her family history is negative for Breast cancer.      Allergies  Allergen Reactions  . Morphine Itching and Nausea And Vomiting  . Penicillins Rash  Current Outpatient Medications:  .  HYDROcodone-acetaminophen (NORCO/VICODIN) 5-325 MG tablet, Take by mouth. 1 tablet 3 times a day as needed for pain, Disp: , Rfl:  .  ibuprofen (ADVIL,MOTRIN) 200 MG tablet, Take 200 mg by mouth every 6 (six) hours as needed., Disp: , Rfl:  .  levothyroxine (SYNTHROID, LEVOTHROID) 100 MCG tablet, TAKE 1 TABLET BY MOUTH DAILY, Disp: 30 tablet, Rfl: 0 .  PAIN MANAGEMENT IT PUMP REFILL, 1 each by Intrathecal route once., Disp: , Rfl:  .  triamterene-hydrochlorothiazide (DYAZIDE) 37.5-25 MG capsule, TAKE 1 CAPSULE BY MOUTH ONCE DAILY, Disp: 90 capsule, Rfl: 1   Patient Care Team: Margaretann Loveless, PA-C as PCP - General (Family Medicine) Kieth Brightly, MD (General Surgery)      Objective:   Vitals: BP 112/60 (BP Location: Left Arm, Patient Position: Sitting, Cuff Size: Normal)   Pulse 100    Temp 98.4 F (36.9 C) (Oral)   Resp 16   Ht 5\' 4"  (1.626 m)   Wt 151 lb 9.6 oz (68.8 kg)   SpO2 97%   BMI 26.02 kg/m    Vitals:   10/06/17 1322  BP: 112/60  Pulse: 100  Resp: 16  Temp: 98.4 F (36.9 C)  TempSrc: Oral  SpO2: 97%  Weight: 151 lb 9.6 oz (68.8 kg)  Height: 5\' 4"  (1.626 m)     Physical Exam  Constitutional: She is oriented to person, place, and time. She appears well-developed and well-nourished. No distress.  HENT:  Head: Normocephalic and atraumatic.  Right Ear: Hearing, tympanic membrane, external ear and ear canal normal.  Left Ear: Hearing, tympanic membrane, external ear and ear canal normal.  Nose: Nose normal.  Mouth/Throat: Uvula is midline, oropharynx is clear and moist and mucous membranes are normal. No oropharyngeal exudate.  Eyes: Pupils are equal, round, and reactive to light. Conjunctivae and EOM are normal. Right eye exhibits no discharge. Left eye exhibits no discharge. No scleral icterus.  Neck: Normal range of motion. Neck supple. No JVD present. Carotid bruit is not present. No tracheal deviation present. No thyromegaly present.  Cardiovascular: Normal rate, regular rhythm, normal heart sounds and intact distal pulses. Exam reveals no gallop and no friction rub.  No murmur heard. Pulmonary/Chest: Effort normal and breath sounds normal. No respiratory distress. She has no wheezes. She has no rales. She exhibits no tenderness. Right breast exhibits no inverted nipple, no mass, no nipple discharge, no skin change and no tenderness. Left breast exhibits no inverted nipple, no mass, no nipple discharge, no skin change and no tenderness. No breast swelling, tenderness, discharge or bleeding. Breasts are symmetrical.  Abdominal: Soft. Bowel sounds are normal. She exhibits no distension and no mass. There is no tenderness. There is no rebound and no guarding.  Musculoskeletal: Normal range of motion. She exhibits no edema or tenderness.  Lymphadenopathy:      She has no cervical adenopathy.  Neurological: She is alert and oriented to person, place, and time.  Skin: Skin is warm and dry. No rash noted. She is not diaphoretic.  Psychiatric: She has a normal mood and affect. Her behavior is normal. Judgment and thought content normal.  Vitals reviewed.    Depression Screen PHQ 2/9 Scores 10/06/2017 09/03/2016 03/15/2015  PHQ - 2 Score 2 0 1  PHQ- 9 Score 9 - -      Assessment & Plan:     Routine Health Maintenance and Physical Exam  Exercise Activities and Dietary recommendations Goals   None  Immunization History  Administered Date(s) Administered  . Td 10/16/2010  . Tdap 10/16/2010    Health Maintenance  Topic Date Due  . Hepatitis C Screening  March 25, 1965  . HIV Screening  05/28/1978  . PAP SMEAR  05/27/1984  . MAMMOGRAM  02/08/2017  . INFLUENZA VACCINE  09/10/2017  . TETANUS/TDAP  10/15/2020  . COLONOSCOPY  07/12/2023     Discussed health benefits of physical activity, and encouraged her to engage in regular exercise appropriate for her age and condition.    1. Annual physical exam Normal exam today. Patient declines flu vaccine.   2. Breast cancer screening Breast exam today was normal. There is no family history of breast cancer. She does perform regular self breast exams. Mammogram was ordered as below. Information for [facility] was given to patient so she may schedule her mammogram at her convenience. - MM 3D SCREEN BREAST BILATERAL; Future  3. Acquired hypothyroidism Stable. Diagnosis pulled for medication refill. Continue current medical treatment plan. - levothyroxine (SYNTHROID, LEVOTHROID) 100 MCG tablet; Take 1 tablet (100 mcg total) by mouth daily.  Dispense: 90 tablet; Refill: 3  4. Essential (primary) hypertension Stable. Diagnosis pulled for medication refill. Continue current medical treatment plan. - triamterene-hydrochlorothiazide (DYAZIDE) 37.5-25 MG capsule; Take 1 each (1  capsule total) by mouth daily.  Dispense: 90 capsule; Refill: 3  5. BMI 26.0-26.9,adult Counseled patient on healthy lifestyle modifications including dieting and exercise.   6. Tobacco use disorder, continuous Has tried chantix but had "crazy dreams". Is interested in trying wellbutrin for smoking cessation. Information on smoking cessation printed. Wellbutrin sent in as below.  - buPROPion (WELLBUTRIN SR) 150 MG 12 hr tablet; Take 1 tablet (150 mg total) by mouth 2 (two) times daily.  Dispense: 180 tablet; Refill: 1  7. Tobacco abuse counseling See above medical treatment plan. - buPROPion (WELLBUTRIN SR) 150 MG 12 hr tablet; Take 1 tablet (150 mg total) by mouth 2 (two) times daily.  Dispense: 180 tablet; Refill: 1  --------------------------------------------------------------------    Margaretann Loveless, PA-C  [facility]

## 2017-10-06 NOTE — Patient Instructions (Signed)
Steps to Quit Smoking Smoking tobacco can be harmful to your health and can affect almost every organ in your body. Smoking puts you, and those around you, at risk for developing many serious chronic diseases. Quitting smoking is difficult, but it is one of the best things that you can do for your health. It is never too late to quit. What are the benefits of quitting smoking? When you quit smoking, you lower your risk of developing serious diseases and conditions, such as:  Lung cancer or lung disease, such as COPD.  Heart disease.  Stroke.  Heart attack.  Infertility.  Osteoporosis and bone fractures.  Additionally, symptoms such as coughing, wheezing, and shortness of breath may get better when you quit. You may also find that you get sick less often because your body is stronger at fighting off colds and infections. If you are pregnant, quitting smoking can help to reduce your chances of having a baby of low birth weight. How do I get ready to quit? When you decide to quit smoking, create a plan to make sure that you are successful. Before you quit:  Pick a date to quit. Set a date within the next two weeks to give you time to prepare.  Write down the reasons why you are quitting. Keep this list in places where you will see it often, such as on your bathroom mirror or in your car or wallet.  Identify the people, places, things, and activities that make you want to smoke (triggers) and avoid them. Make sure to take these actions: ? Throw away all cigarettes at home, at work, and in your car. ? Throw away smoking accessories, such as ashtrays and lighters. ? Clean your car and make sure to empty the ashtray. ? Clean your home, including curtains and carpets.  Tell your family, friends, and coworkers that you are quitting. Support from your loved ones can make quitting easier.  Talk with your health care provider about your options for quitting smoking.  Find out what treatment  options are covered by your health insurance.  What strategies can I use to quit smoking? Talk with your healthcare provider about different strategies to quit smoking. Some strategies include:  Quitting smoking altogether instead of gradually lessening how much you smoke over a period of time. Research shows that quitting "cold turkey" is more successful than gradually quitting.  Attending in-person counseling to help you build problem-solving skills. You are more likely to have success in quitting if you attend several counseling sessions. Even short sessions of 10 minutes can be effective.  Finding resources and support systems that can help you to quit smoking and remain smoke-free after you quit. These resources are most helpful when you use them often. They can include: ? Online chats with a counselor. ? Telephone quitlines. ? Printed self-help materials. ? Support groups or group counseling. ? Text messaging programs. ? Mobile phone applications.  Taking medicines to help you quit smoking. (If you are pregnant or breastfeeding, talk with your health care provider first.) Some medicines contain nicotine and some do not. Both types of medicines help with cravings, but the medicines that include nicotine help to relieve withdrawal symptoms. Your health care provider may recommend: ? Nicotine patches, gum, or lozenges. ? Nicotine inhalers or sprays. ? Non-nicotine medicine that is taken by mouth.  Talk with your health care provider about combining strategies, such as taking medicines while you are also receiving in-person counseling. Using these two strategies together   makes you more likely to succeed in quitting than if you used either strategy on its own. If you are pregnant or breastfeeding, talk with your health care provider about finding counseling or other support strategies to quit smoking. Do not take medicine to help you quit smoking unless told to do so by your health care  provider. What things can I do to make it easier to quit? Quitting smoking might feel overwhelming at first, but there is a lot that you can do to make it easier. Take these important actions:  Reach out to your family and friends and ask that they support and encourage you during this time. Call telephone quitlines, reach out to support groups, or work with a counselor for support.  Ask people who smoke to avoid smoking around you.  Avoid places that trigger you to smoke, such as bars, parties, or smoke-break areas at work.  Spend time around people who do not smoke.  Lessen stress in your life, because stress can be a smoking trigger for some people. To lessen stress, try: ? Exercising regularly. ? Deep-breathing exercises. ? Yoga. ? Meditating. ? Performing a body scan. This involves closing your eyes, scanning your body from head to toe, and noticing which parts of your body are particularly tense. Purposefully relax the muscles in those areas.  Download or purchase mobile phone or tablet apps (applications) that can help you stick to your quit plan by providing reminders, tips, and encouragement. There are many free apps, such as QuitGuide from the [employer] for Disease Control and Prevention). You can find other support for quitting smoking (smoking cessation) through smokefree.gov and other websites.  How will I feel when I quit smoking? Within the first 24 hours of quitting smoking, you may start to feel some withdrawal symptoms. These symptoms are usually most noticeable 2-3 days after quitting, but they usually do not last beyond 2-3 weeks. Changes or symptoms that you might experience include:  Mood swings.  Restlessness, anxiety, or irritation.  Difficulty concentrating.  Dizziness.  Strong cravings for sugary foods in addition to nicotine.  Mild weight gain.  Constipation.  Nausea.  Coughing or a sore throat.  Changes in how your medicines work in your  body.  A depressed mood.  Difficulty sleeping (insomnia).  After the first 2-3 weeks of quitting, you may start to notice more positive results, such as:  Improved sense of smell and taste.  Decreased coughing and sore throat.  Slower heart rate.  Lower blood pressure.  Clearer skin.  The ability to breathe more easily.  Fewer sick days.  Quitting smoking is very challenging for most people. Do not get discouraged if you are not successful the first time. Some people need to make many attempts to quit before they achieve long-term success. Do your best to stick to your quit plan, and talk with your health care provider if you have any questions or concerns. This information is not intended to replace advice given to you by your health care provider. Make sure you discuss any questions you have with your health care provider. Document Released: 01/21/2001 Document Revised: 09/25/2015 Document Reviewed: 06/13/2014 Elsevier Interactive Patient Education  2018 [employer]. Bupropion sustained-release tablets (smoking cessation) What is this medicine? BUPROPION (byoo PROE pee on) is used to help people quit smoking. This medicine may be used for other purposes; ask your health care provider or pharmacist if you have questions. COMMON BRAND NAME(S): Buproban, Zyban What should I tell my  health care provider before I take this medicine? They need to know if you have any of these conditions: -an eating disorder, such as anorexia or bulimia -bipolar disorder or psychosis -diabetes or high blood sugar, treated with medication -glaucoma -head injury or brain tumor -heart disease, previous heart attack, or irregular heart beat -high blood pressure -kidney or liver disease -seizures -suicidal thoughts or a previous suicide attempt -Tourette's syndrome -weight loss -an unusual or allergic reaction to bupropion, other medicines, foods, dyes, or  preservatives -breast-feeding -pregnant or trying to become pregnant How should I use this medicine? Take this medicine by mouth with a glass of water. Follow the directions on the prescription label. You can take it with or without food. If it upsets your stomach, take it with food. Do not cut, crush or chew this medicine. Take your medicine at regular intervals. If you take this medicine more than once a day, take your second dose at least 8 hours after you take your first dose. To limit difficulty in sleeping, avoid taking this medicine at bedtime. Do not take your medicine more often than directed. Do not stop taking this medicine suddenly except upon the advice of your doctor. Stopping this medicine too quickly may cause serious side effects. A special MedGuide will be given to you by the pharmacist with each prescription and refill. Be sure to read this information carefully each time. Talk to your pediatrician regarding the use of this medicine in children. Special care may be needed. Overdosage: If you think you have taken too much of this medicine contact a poison control center or emergency room at once. NOTE: This medicine is only for you. Do not share this medicine with others. What if I miss a dose? If you miss a dose, skip the missed dose and take your next tablet at the regular time. There should be at least 8 hours between doses. Do not take double or extra doses. What may interact with this medicine? Do not take this medicine with any of the following medications: -linezolid -MAOIs like Azilect, Carbex, Eldepryl, Marplan, Nardil, and Parnate -methylene blue (injected into a vein) -other medicines that contain bupropion like Wellbutrin This medicine may also interact with the following medications: -alcohol -certain medicines for anxiety or sleep -certain medicines for blood pressure like metoprolol, propranolol -certain medicines for depression or psychotic  disturbances -certain medicines for HIV or AIDS like efavirenz, lopinavir, nelfinavir, ritonavir -certain medicines for irregular heart beat like propafenone, flecainide -certain medicines for Parkinson's disease like amantadine, levodopa -certain medicines for seizures like carbamazepine, phenytoin, phenobarbital -cimetidine -clopidogrel -cyclophosphamide -digoxin -furazolidone -isoniazid -nicotine -orphenadrine -procarbazine -steroid medicines like prednisone or cortisone -stimulant medicines for attention disorders, weight loss, or to stay awake -tamoxifen -theophylline -thiotepa -ticlopidine -tramadol -warfarin This list may not describe all possible interactions. Give your health care provider a list of all the medicines, herbs, non-prescription drugs, or dietary supplements you use. Also tell them if you smoke, drink alcohol, or use illegal drugs. Some items may interact with your medicine. What should I watch for while using this medicine? Visit your doctor or health care professional for regular checks on your progress. This medicine should be used together with a patient support program. It is important to participate in a behavioral program, counseling, or other support program that is recommended by your health care professional. Patients and their families should watch out for new or worsening thoughts of suicide or depression. Also watch out for sudden changes in feelings such  as feeling anxious, agitated, panicky, irritable, hostile, aggressive, impulsive, severely restless, overly excited and hyperactive, or not being able to sleep. If this happens, especially at the beginning of treatment or after a change in dose, call your health care professional. Avoid alcoholic drinks while taking this medicine. Drinking excessive alcoholic beverages, using sleeping or anxiety medicines, or quickly stopping the use of these agents while taking this medicine may increase your risk for a  seizure. Do not drive or use heavy machinery until you know how this medicine affects you. This medicine can impair your ability to perform these tasks. Do not take this medicine close to bedtime. It may prevent you from sleeping. Your mouth may get dry. Chewing sugarless gum or sucking hard candy, and drinking plenty of water may help. Contact your doctor if the problem does not go away or is severe. Do not use nicotine patches or chewing gum without the advice of your doctor or health care professional while taking this medicine. You may need to have your blood pressure taken regularly if your doctor recommends that you use both nicotine and this medicine together. What side effects may I notice from receiving this medicine? Side effects that you should report to your doctor or health care professional as soon as possible: -allergic reactions like skin rash, itching or hives, swelling of the face, lips, or tongue -breathing problems -changes in vision -confusion -elevated mood, decreased need for sleep, racing thoughts, impulsive behavior -fast or irregular heartbeat -hallucinations, loss of contact with reality -increased blood pressure -redness, blistering, peeling or loosening of the skin, including inside the mouth -seizures -suicidal thoughts or other mood changes -unusually weak or tired -vomiting Side effects that usually do not require medical attention (report to your doctor or health care professional if they continue or are bothersome): -constipation -headache -loss of appetite -nausea -tremors -weight loss This list may not describe all possible side effects. Call your doctor for medical advice about side effects. You may report side effects to FDA at 1-800-FDA-1088. Where should I keep my medicine? Keep out of the reach of children. Store at room temperature between 20 and 25 degrees C (68 and 77 degrees F). Protect from light. Keep container tightly closed. Throw away  any unused medicine after the expiration date. NOTE: This sheet is a summary. It may not cover all possible information. If you have questions about this medicine, talk to your doctor, pharmacist, or health care provider.  2018 Elsevier/Gold Standard (2015-07-20 13:49:28)

## 2017-10-07 ENCOUNTER — Other Ambulatory Visit: Payer: Self-pay

## 2017-10-07 LAB — CBC WITH DIFFERENTIAL/PLATELET
BASOS ABS: 0 10*3/uL (ref 0.0–0.2)
Basos: 0 %
EOS (ABSOLUTE): 0.1 10*3/uL (ref 0.0–0.4)
EOS: 2 %
HEMATOCRIT: 42.7 % (ref 34.0–46.6)
HEMOGLOBIN: 15 g/dL (ref 11.1–15.9)
IMMATURE GRANS (ABS): 0 10*3/uL (ref 0.0–0.1)
Immature Granulocytes: 0 %
LYMPHS ABS: 1.8 10*3/uL (ref 0.7–3.1)
LYMPHS: 21 %
MCH: 31.1 pg (ref 26.6–33.0)
MCHC: 35.1 g/dL (ref 31.5–35.7)
MCV: 88 fL (ref 79–97)
MONOCYTES: 7 %
Monocytes Absolute: 0.6 10*3/uL (ref 0.1–0.9)
NEUTROS ABS: 6.2 10*3/uL (ref 1.4–7.0)
Neutrophils: 70 %
Platelets: 218 10*3/uL (ref 150–450)
RBC: 4.83 x10E6/uL (ref 3.77–5.28)
RDW: 14.4 % (ref 12.3–15.4)
WBC: 8.8 10*3/uL (ref 3.4–10.8)

## 2017-10-07 LAB — COMPREHENSIVE METABOLIC PANEL
ALBUMIN: 4.5 g/dL (ref 3.5–5.5)
ALT: 19 IU/L (ref 0–32)
AST: 18 IU/L (ref 0–40)
Albumin/Globulin Ratio: 1.9 (ref 1.2–2.2)
Alkaline Phosphatase: 101 IU/L (ref 39–117)
BUN/Creatinine Ratio: 12 (ref 9–23)
BUN: 17 mg/dL (ref 6–24)
Bilirubin Total: 0.3 mg/dL (ref 0.0–1.2)
CO2: 24 mmol/L (ref 20–29)
CREATININE: 1.46 mg/dL — AB (ref 0.57–1.00)
Calcium: 9.7 mg/dL (ref 8.7–10.2)
Chloride: 96 mmol/L (ref 96–106)
GFR calc non Af Amer: 41 mL/min/{1.73_m2} — ABNORMAL LOW (ref >59)
GFR, EST AFRICAN AMERICAN: 47 mL/min/{1.73_m2} — AB (ref >59)
GLOBULIN, TOTAL: 2.4 g/dL (ref 1.5–4.5)
GLUCOSE: 102 mg/dL — AB (ref 65–99)
Potassium: 3.1 mmol/L — ABNORMAL LOW (ref 3.5–5.2)
SODIUM: 138 mmol/L (ref 134–144)
TOTAL PROTEIN: 6.9 g/dL (ref 6.0–8.5)

## 2017-10-07 LAB — LIPID PANEL
CHOL/HDL RATIO: 4.9 ratio — AB (ref 0.0–4.4)
Cholesterol, Total: 163 mg/dL (ref 100–199)
HDL: 33 mg/dL — AB (ref >39)
LDL CALC: 109 mg/dL — AB (ref 0–99)
Triglycerides: 105 mg/dL (ref 0–149)
VLDL CHOLESTEROL CAL: 21 mg/dL (ref 5–40)

## 2017-10-07 LAB — HEMOGLOBIN A1C
ESTIMATED AVERAGE GLUCOSE: 128 mg/dL
Hgb A1c MFr Bld: 6.1 % — ABNORMAL HIGH (ref 4.8–5.6)

## 2017-10-07 LAB — TSH: TSH: 1.99 u[IU]/mL (ref 0.450–4.500)

## 2017-10-07 LAB — VITAMIN D 25 HYDROXY (VIT D DEFICIENCY, FRACTURES): VIT D 25 HYDROXY: 61.9 ng/mL (ref 30.0–100.0)

## 2017-10-07 MED ORDER — POTASSIUM CHLORIDE CRYS ER 20 MEQ PO TBCR: 20.0000 meq | EXTENDED_RELEASE_TABLET | Freq: Every day | ORAL | 3 refills | Status: DC

## 2017-10-07 NOTE — Telephone Encounter (Signed)
Patient advised as directed below.  Thanks,  -Joseline 

## 2017-10-07 NOTE — Telephone Encounter (Signed)
-----   Message from Margaretann LovelessJennifer M Burnette, PA-C sent at 10/07/2017  8:22 AM EDT ----- Blood count normal. Kidney function stable. Liver enzymes normal. Sodium normal. Potassium borderline low. Will send low dose potassium supplement to increase. Take x 2 weeks and recheck. Thyroid normal. Cholesterol up slightly from last year. A1c also borderline at 6.1, this is indicative of prediabetes. Recommend to work on healthy lifestyle modifications including limiting fatty foods, sugars, simple carbs, and red meats in diet. Increasing physical activity to try to get in 150 min of moderate activity per week will also help. Vit D is normal.

## 2017-11-18 DIAGNOSIS — G894 Chronic pain syndrome: Secondary | ICD-10-CM | POA: Diagnosis not present

## 2017-11-18 DIAGNOSIS — M5442 Lumbago with sciatica, left side: Secondary | ICD-10-CM | POA: Diagnosis not present

## 2017-11-18 DIAGNOSIS — M1610 Unilateral primary osteoarthritis, unspecified hip: Secondary | ICD-10-CM | POA: Diagnosis not present

## 2017-11-18 DIAGNOSIS — Z5181 Encounter for therapeutic drug level monitoring: Secondary | ICD-10-CM | POA: Diagnosis not present

## 2017-11-18 DIAGNOSIS — Z79899 Other long term (current) drug therapy: Secondary | ICD-10-CM | POA: Diagnosis not present

## 2017-11-18 DIAGNOSIS — Z978 Presence of other specified devices: Secondary | ICD-10-CM | POA: Diagnosis not present

## 2017-11-18 DIAGNOSIS — Z791 Long term (current) use of non-steroidal anti-inflammatories (NSAID): Secondary | ICD-10-CM | POA: Diagnosis not present

## 2017-11-18 DIAGNOSIS — M5441 Lumbago with sciatica, right side: Secondary | ICD-10-CM | POA: Diagnosis not present

## 2017-12-03 DIAGNOSIS — Z978 Presence of other specified devices: Secondary | ICD-10-CM | POA: Diagnosis not present

## 2018-01-13 DIAGNOSIS — G894 Chronic pain syndrome: Secondary | ICD-10-CM | POA: Diagnosis not present

## 2018-01-13 DIAGNOSIS — Z978 Presence of other specified devices: Secondary | ICD-10-CM | POA: Diagnosis not present

## 2018-01-13 DIAGNOSIS — Z791 Long term (current) use of non-steroidal anti-inflammatories (NSAID): Secondary | ICD-10-CM | POA: Diagnosis not present

## 2018-01-13 DIAGNOSIS — M1610 Unilateral primary osteoarthritis, unspecified hip: Secondary | ICD-10-CM | POA: Diagnosis not present

## 2018-02-06 DIAGNOSIS — J209 Acute bronchitis, unspecified: Secondary | ICD-10-CM | POA: Diagnosis not present

## 2018-02-06 DIAGNOSIS — Z8709 Personal history of other diseases of the respiratory system: Secondary | ICD-10-CM | POA: Diagnosis not present

## 2018-02-09 DIAGNOSIS — R0602 Shortness of breath: Secondary | ICD-10-CM | POA: Diagnosis not present

## 2018-02-09 DIAGNOSIS — J209 Acute bronchitis, unspecified: Secondary | ICD-10-CM | POA: Diagnosis not present

## 2018-02-11 ENCOUNTER — Inpatient Hospital Stay
Admission: EM | Admit: 2018-02-11 | Discharge: 2018-02-14 | DRG: 190 | Disposition: A | Discharge status: Home / Self Care | Payer: BLUE CROSS/BLUE SHIELD | Attending: Internal Medicine | Admitting: Internal Medicine

## 2018-02-11 ENCOUNTER — Encounter: Payer: Self-pay | Admitting: Emergency Medicine

## 2018-02-11 ENCOUNTER — Emergency Department: Payer: BLUE CROSS/BLUE SHIELD

## 2018-02-11 ENCOUNTER — Other Ambulatory Visit: Payer: Self-pay

## 2018-02-11 DIAGNOSIS — J440 Chronic obstructive pulmonary disease with acute lower respiratory infection: Secondary | ICD-10-CM | POA: Diagnosis not present

## 2018-02-11 DIAGNOSIS — Z833 Family history of diabetes mellitus: Secondary | ICD-10-CM

## 2018-02-11 DIAGNOSIS — E039 Hypothyroidism, unspecified: Secondary | ICD-10-CM | POA: Diagnosis present

## 2018-02-11 DIAGNOSIS — J209 Acute bronchitis, unspecified: Secondary | ICD-10-CM | POA: Diagnosis not present

## 2018-02-11 DIAGNOSIS — Z79899 Other long term (current) drug therapy: Secondary | ICD-10-CM

## 2018-02-11 DIAGNOSIS — J9601 Acute respiratory failure with hypoxia: Secondary | ICD-10-CM | POA: Diagnosis present

## 2018-02-11 DIAGNOSIS — E7800 Pure hypercholesterolemia, unspecified: Secondary | ICD-10-CM | POA: Diagnosis not present

## 2018-02-11 DIAGNOSIS — K219 Gastro-esophageal reflux disease without esophagitis: Secondary | ICD-10-CM | POA: Diagnosis not present

## 2018-02-11 DIAGNOSIS — Z880 Allergy status to penicillin: Secondary | ICD-10-CM | POA: Diagnosis not present

## 2018-02-11 DIAGNOSIS — G894 Chronic pain syndrome: Secondary | ICD-10-CM | POA: Diagnosis present

## 2018-02-11 DIAGNOSIS — Z885 Allergy status to narcotic agent status: Secondary | ICD-10-CM

## 2018-02-11 DIAGNOSIS — R0602 Shortness of breath: Secondary | ICD-10-CM | POA: Diagnosis not present

## 2018-02-11 DIAGNOSIS — R062 Wheezing: Secondary | ICD-10-CM | POA: Diagnosis not present

## 2018-02-11 DIAGNOSIS — Z79890 Hormone replacement therapy (postmenopausal): Secondary | ICD-10-CM | POA: Diagnosis not present

## 2018-02-11 DIAGNOSIS — R000 Tachycardia, unspecified: Secondary | ICD-10-CM | POA: Diagnosis not present

## 2018-02-11 DIAGNOSIS — R05 Cough: Secondary | ICD-10-CM | POA: Diagnosis not present

## 2018-02-11 DIAGNOSIS — M1990 Unspecified osteoarthritis, unspecified site: Secondary | ICD-10-CM | POA: Diagnosis not present

## 2018-02-11 DIAGNOSIS — Z90710 Acquired absence of both cervix and uterus: Secondary | ICD-10-CM | POA: Diagnosis not present

## 2018-02-11 DIAGNOSIS — J441 Chronic obstructive pulmonary disease with (acute) exacerbation: Secondary | ICD-10-CM | POA: Diagnosis not present

## 2018-02-11 DIAGNOSIS — E559 Vitamin D deficiency, unspecified: Secondary | ICD-10-CM | POA: Diagnosis not present

## 2018-02-11 DIAGNOSIS — I10 Essential (primary) hypertension: Secondary | ICD-10-CM | POA: Diagnosis present

## 2018-02-11 DIAGNOSIS — Z96641 Presence of right artificial hip joint: Secondary | ICD-10-CM | POA: Diagnosis present

## 2018-02-11 DIAGNOSIS — F17210 Nicotine dependence, cigarettes, uncomplicated: Secondary | ICD-10-CM | POA: Diagnosis present

## 2018-02-11 DIAGNOSIS — Z720 Tobacco use: Secondary | ICD-10-CM | POA: Diagnosis not present

## 2018-02-11 LAB — INFLUENZA PANEL BY PCR (TYPE A & B)
Influenza A By PCR: POSITIVE — AB
Influenza B By PCR: NEGATIVE

## 2018-02-11 LAB — CBC
HCT: 46.2 % — ABNORMAL HIGH (ref 36.0–46.0)
Hemoglobin: 15.6 g/dL — ABNORMAL HIGH (ref 12.0–15.0)
MCH: 29.9 pg (ref 26.0–34.0)
MCHC: 33.8 g/dL (ref 30.0–36.0)
MCV: 88.7 fL (ref 80.0–100.0)
Platelets: 212 10*3/uL (ref 150–400)
RBC: 5.21 MIL/uL — ABNORMAL HIGH (ref 3.87–5.11)
RDW: 12 % (ref 11.5–15.5)
WBC: 8.6 10*3/uL (ref 4.0–10.5)
nRBC: 0 % (ref 0.0–0.2)

## 2018-02-11 LAB — BASIC METABOLIC PANEL
ANION GAP: 8 (ref 5–15)
BUN: 17 mg/dL (ref 6–20)
CALCIUM: 9 mg/dL (ref 8.9–10.3)
CO2: 27 mmol/L (ref 22–32)
CREATININE: 1.27 mg/dL — AB (ref 0.44–1.00)
Chloride: 104 mmol/L (ref 98–111)
GFR, EST AFRICAN AMERICAN: 55 mL/min — AB (ref >60)
GFR, EST NON AFRICAN AMERICAN: 48 mL/min — AB (ref >60)
GLUCOSE: 132 mg/dL — AB (ref 70–99)
Potassium: 3.3 mmol/L — ABNORMAL LOW (ref 3.5–5.1)
Sodium: 139 mmol/L (ref 135–145)

## 2018-02-11 LAB — BRAIN NATRIURETIC PEPTIDE: B Natriuretic Peptide: 11 pg/mL (ref 0.0–100.0)

## 2018-02-11 LAB — TROPONIN I: Troponin I: <0.03 ng/mL (ref <0.03)

## 2018-02-11 MED ORDER — ALBUTEROL SULFATE (2.5 MG/3ML) 0.083% IN NEBU
INHALATION_SOLUTION | RESPIRATORY_TRACT | Status: AC
  Filled 2018-02-11: qty 6

## 2018-02-11 MED ORDER — ALBUTEROL (5 MG/ML) CONTINUOUS INHALATION SOLN
5.0000 mg/h | INHALATION_SOLUTION | Freq: Once | RESPIRATORY_TRACT | Status: AC
  Administered 2018-02-11: 5 mg/h via RESPIRATORY_TRACT
  Filled 2018-02-11: qty 20

## 2018-02-11 MED ORDER — ALBUTEROL SULFATE (2.5 MG/3ML) 0.083% IN NEBU: 2.5000 mg | INHALATION_SOLUTION | RESPIRATORY_TRACT | Status: DC | PRN

## 2018-02-11 MED ORDER — ENOXAPARIN SODIUM 40 MG/0.4ML ~~LOC~~ SOLN
40.0000 mg | SUBCUTANEOUS | Status: DC
  Administered 2018-02-11 – 2018-02-13 (×3): 40 mg via SUBCUTANEOUS
  Filled 2018-02-11 (×3): qty 0.4

## 2018-02-11 MED ORDER — BUPROPION HCL ER (SR) 150 MG PO TB12
150.0000 mg | ORAL_TABLET | Freq: Two times a day (BID) | ORAL | Status: DC
  Administered 2018-02-11 – 2018-02-14 (×6): 150 mg via ORAL
  Filled 2018-02-11 (×7): qty 1

## 2018-02-11 MED ORDER — ALBUTEROL SULFATE (2.5 MG/3ML) 0.083% IN NEBU
5.0000 mg | INHALATION_SOLUTION | Freq: Once | RESPIRATORY_TRACT | Status: AC
  Administered 2018-02-11: 5 mg via RESPIRATORY_TRACT
  Administered 2018-02-11: 2.5 mg via RESPIRATORY_TRACT
  Filled 2018-02-11: qty 6

## 2018-02-11 MED ORDER — HYDROCODONE-ACETAMINOPHEN 5-325 MG PO TABS
1.0000 | ORAL_TABLET | Freq: Four times a day (QID) | ORAL | Status: DC | PRN
  Administered 2018-02-11 – 2018-02-14 (×7): 1 via ORAL
  Filled 2018-02-11 (×7): qty 1

## 2018-02-11 MED ORDER — IBUPROFEN 400 MG PO TABS
400.0000 mg | ORAL_TABLET | Freq: Four times a day (QID) | ORAL | Status: DC | PRN
  Administered 2018-02-13 (×2): 400 mg via ORAL
  Filled 2018-02-11 (×2): qty 1

## 2018-02-11 MED ORDER — POLYETHYLENE GLYCOL 3350 17 G PO PACK
17.0000 g | PACK | Freq: Every day | ORAL | Status: DC | PRN
  Filled 2018-02-11: qty 1

## 2018-02-11 MED ORDER — METHYLPREDNISOLONE SODIUM SUCC 125 MG IJ SOLR
60.0000 mg | Freq: Two times a day (BID) | INTRAMUSCULAR | Status: DC
  Administered 2018-02-11 – 2018-02-14 (×6): 60 mg via INTRAVENOUS
  Filled 2018-02-11 (×6): qty 2

## 2018-02-11 MED ORDER — DEXAMETHASONE SODIUM PHOSPHATE 10 MG/ML IJ SOLN
10.0000 mg | Freq: Once | INTRAMUSCULAR | Status: AC
  Administered 2018-02-11: 10 mg via INTRAMUSCULAR
  Filled 2018-02-11: qty 1

## 2018-02-11 MED ORDER — ACETAMINOPHEN 650 MG RE SUPP
650.0000 mg | Freq: Four times a day (QID) | RECTAL | Status: DC | PRN
  Filled 2018-02-11: qty 1

## 2018-02-11 MED ORDER — LEVOTHYROXINE SODIUM 100 MCG PO TABS
100.0000 ug | ORAL_TABLET | Freq: Every day | ORAL | Status: DC
  Administered 2018-02-12 – 2018-02-14 (×3): 100 ug via ORAL
  Filled 2018-02-11 (×3): qty 1

## 2018-02-11 MED ORDER — ALBUTEROL SULFATE (2.5 MG/3ML) 0.083% IN NEBU
INHALATION_SOLUTION | RESPIRATORY_TRACT | Status: AC
  Administered 2018-02-11: 2.5 mg via RESPIRATORY_TRACT
  Filled 2018-02-11: qty 6

## 2018-02-11 MED ORDER — ONDANSETRON HCL 4 MG/2ML IJ SOLN: 4.0000 mg | Freq: Four times a day (QID) | INTRAMUSCULAR | Status: DC | PRN

## 2018-02-11 MED ORDER — ONDANSETRON HCL 4 MG PO TABS: 4.0000 mg | ORAL_TABLET | Freq: Four times a day (QID) | ORAL | Status: DC | PRN

## 2018-02-11 MED ORDER — TRIAMTERENE-HCTZ 37.5-25 MG PO TABS
1.0000 | ORAL_TABLET | Freq: Every day | ORAL | Status: DC
  Administered 2018-02-12 – 2018-02-14 (×3): 1 via ORAL
  Filled 2018-02-11 (×3): qty 1

## 2018-02-11 MED ORDER — POTASSIUM CHLORIDE CRYS ER 20 MEQ PO TBCR
20.0000 meq | EXTENDED_RELEASE_TABLET | Freq: Every day | ORAL | Status: DC
  Administered 2018-02-13 – 2018-02-14 (×2): 20 meq via ORAL
  Filled 2018-02-11 (×3): qty 1

## 2018-02-11 MED ORDER — ACETAMINOPHEN 325 MG PO TABS
650.0000 mg | ORAL_TABLET | Freq: Four times a day (QID) | ORAL | Status: DC | PRN
  Filled 2018-02-11: qty 2

## 2018-02-11 MED ORDER — POTASSIUM CHLORIDE CRYS ER 20 MEQ PO TBCR
40.0000 meq | EXTENDED_RELEASE_TABLET | Freq: Once | ORAL | Status: AC
  Administered 2018-02-11: 40 meq via ORAL
  Filled 2018-02-11: qty 2

## 2018-02-11 MED ORDER — IPRATROPIUM-ALBUTEROL 0.5-2.5 (3) MG/3ML IN SOLN
3.0000 mL | Freq: Four times a day (QID) | RESPIRATORY_TRACT | Status: DC
  Administered 2018-02-11 – 2018-02-14 (×10): 3 mL via RESPIRATORY_TRACT
  Filled 2018-02-11 (×10): qty 3

## 2018-02-11 NOTE — ED Notes (Signed)
Pt placed on 4L at this time 

## 2018-02-11 NOTE — Progress Notes (Signed)
Advance care planning  Purpose of Encounter Acute bronchitis  Parties in Attendance Patient and her mother  Patients Decisional capacity Alert and oriented.  Able to make medical decisions.  No healthcare power of attorney documentation or advanced directives in place.  Patient wants her mother and husband to make decisions together if she cannot at any point.  We discussed regarding acute bronchitis, prognosis and treatment plan.  CODE STATUS discussed and patient tells me that she would not want to be alive as a vegetable.  This has been made clear to her family.  Would like temporary attempts at intubation and resuscitation if needed.  CODE STATUS orders entered and a full code placed   Time spent - 17 minutes

## 2018-02-11 NOTE — ED Triage Notes (Signed)
Says she has breathing difficulty.  Has been to urgent care twice and just finished z pack and steroids.  Not any better. Using albuterol inhaler.  No fever.

## 2018-02-11 NOTE — ED Notes (Signed)
Spoke with pt about wait times and what to expect next. Advised pt that I am available for further questions if needed.  

## 2018-02-11 NOTE — ED Provider Notes (Addendum)
[facility] Emergency Department Provider Note  ____________________________________________   I have reviewed the triage vital signs and the nursing notes. Where available I have reviewed prior notes and, if possible and indicated, outside hospital notes.    HISTORY  Chief Complaint Shortness of Breath    HPI Tasha Nichols is a 53 y.o. female who with a history of COPD chronic pain, and continued tobacco abuse, presents today complaining of cough and wheeze.  She denies any fever chills or chest pain.  The cough is not particular productive.  She is already been on a dose of steroids, which was a rapid taper starting at 40 mg, it helped while it was on and then it went off and she is now coughing and wheezing she also had a Z-Pak which did not help.  She has had no colored mucus or ongoing URI symptoms.  This is happened to her several times in the past.  She is using her nebulizer at home with some results but then she starts to cough again.  She denies any leg swelling, she has no personal family history of PE or DVT no recent travel no leg swelling and she states this feels exactly like prior COPD flares for which we have seen her in the past and [facility].  He did receive a DuoNeb in the waiting room which made her feel a lot better and she is adamant that she does not want to be admitted to the hospital   Past Medical History:  Diagnosis Date  . Arthritis   . Chronic pain   . GERD (gastroesophageal reflux disease)   . Hypertension   . Thyroid disease     Patient Active Problem List   Diagnosis Date Noted  . Vitamin D deficiency 09/03/2016  . COPD (chronic obstructive pulmonary disease) (HCC) 04/03/2015  . Tobacco abuse counseling 03/15/2015  . Lymphadenopathy 01/29/2015  . Acquired hypothyroidism 10/17/2014  . Arthritis 10/17/2014  . Back pain, chronic 10/17/2014  . Clinical depression 10/17/2014  . Essential (primary)  hypertension 10/17/2014  . Acid reflux 10/17/2014  . Hypercholesteremia 10/17/2014  . Cannot sleep 10/17/2014  . Headache, migraine 10/17/2014  . Acquired polycythemia 10/17/2014  . Compulsive tobacco user syndrome 10/17/2014  . Presence of polypropylene mesh in chest wall 06/01/2013  . Presence of other specified functional implants 06/01/2013  . Disorder of hip joint 07/29/2012  . Chronic pain associated with significant psychosocial dysfunction 05/06/2012    Past Surgical History:  Procedure Laterality Date  . ABDOMINAL HYSTERECTOMY  2001  . CESAREAN SECTION  1994  . JOINT REPLACEMENT Right 2005, 2010   hip  . PAIN PUMP IMPLANTATION  2008, 2014   Dr Court Joy  . TONSILLECTOMY  1970    Prior to Admission medications   Medication Sig Start Date End Date Taking? Authorizing Provider  buPROPion (WELLBUTRIN SR) 150 MG 12 hr tablet Take 1 tablet (150 mg total) by mouth 2 (two) times daily. 10/06/17   Margaretann Loveless, PA-C  HYDROcodone-acetaminophen (NORCO/VICODIN) 5-325 MG tablet Take by mouth. 1 tablet 3 times a day as needed for pain 03/07/15   [provider]  ibuprofen (ADVIL,MOTRIN) 200 MG tablet Take 200 mg by mouth every 6 (six) hours as needed.    [provider]  levothyroxine (SYNTHROID, LEVOTHROID) 100 MCG tablet Take 1 tablet (100 mcg total) by mouth daily. 10/06/17   Margaretann Loveless, PA-C  PAIN MANAGEMENT IT PUMP REFILL 1  each by Intrathecal route once.    [provider]  potassium chloride SA (K-DUR,KLOR-CON) 20 MEQ tablet Take 1 tablet (20 mEq total) by mouth daily. 10/07/17   Margaretann Loveless, PA-C  triamterene-hydrochlorothiazide (DYAZIDE) 37.5-25 MG capsule Take 1 each (1 capsule total) by mouth daily. 10/06/17   Margaretann Loveless, PA-C    Allergies Morphine and Penicillins  Family History  Problem Relation Age of Onset  . Breast cancer Neg Hx     Social History Social History   Tobacco Use  . Smoking status: Current  Some Day Smoker    Packs/day: 0.25  . Smokeless tobacco: Never Used  Substance Use Topics  . Alcohol use: No  . Drug use: No    Review of Systems Constitutional: No fever/chills Eyes: No visual changes. ENT: No sore throat. No stiff neck no neck pain Cardiovascular: Denies chest pain. Respiratory: + shortness of breath. Gastrointestinal:   no vomiting.  No diarrhea.  No constipation. Genitourinary: Negative for dysuria. Musculoskeletal: Negative lower extremity swelling Skin: Negative for rash. Neurological: Negative for severe headaches, focal weakness or numbness.   ____________________________________________   PHYSICAL EXAM:  VITAL SIGNS: ED Triage Vitals  Enc Vitals Group     BP 02/11/18 1040 136/73     Pulse Rate 02/11/18 1040 76     Resp 02/11/18 1040 18     Temp 02/11/18 1040 97.9 F (36.6 C)     Temp Source 02/11/18 1040 Oral     SpO2 02/11/18 1040 93 %     Weight 02/11/18 1038 148 lb (67.1 kg)     Height 02/11/18 1038 5\' 4"  (1.626 m)     Head Circumference --      Peak Flow --      Pain Score 02/11/18 1038 0     Pain Loc --      Pain Edu? --      Excl. in GC? --     Constitutional: Alert and oriented. Well appearing and in no acute distress. Eyes: Conjunctivae are normal Head: Atraumatic HEENT: No congestion/rhinnorhea. Mucous membranes are moist.  Oropharynx non-erythematous Neck:   Nontender with no meningismus, no masses, no stridor Cardiovascular: Normal rate, regular rhythm. Grossly normal heart sounds.  Good peripheral circulation. Respiratory: Normal respiratory effort.  There are mild wheezes noted Abdominal: Soft and nontender. No distention. No guarding no rebound Back:  There is no focal tenderness or step off.  there is no midline tenderness there are no lesions noted. there is no CVA tenderness Musculoskeletal: No lower extremity tenderness, no upper extremity tenderness. No joint effusions, no DVT signs strong distal pulses no  edema Neurologic:  Normal speech and language. No gross focal neurologic deficits are appreciated.  Skin:  Skin is warm, dry and intact. No rash noted. Psychiatric: Mood and affect are normal. Speech and behavior are normal.  ____________________________________________   LABS (all labs ordered are listed, but only abnormal results are displayed)  Labs Reviewed  BASIC METABOLIC PANEL - Abnormal; Notable for the following components:      Result Value   Potassium 3.3 (*)    Glucose, Bld 132 (*)    Creatinine, Ser 1.27 (*)    GFR calc non Af Amer 48 (*)    GFR calc Af Amer 55 (*)    All other components within normal limits  CBC - Abnormal; Notable for the following components:   RBC 5.21 (*)    Hemoglobin 15.6 (*)    HCT 46.2 (*)  All other components within normal limits    Pertinent labs  results that were available during my care of the patient were reviewed by me and considered in my medical decision making (see chart for details). ____________________________________________  EKG  I personally interpreted any EKGs ordered by me or triage snr rate73 no ste no std non spec st changes.  ____________________________________________  RADIOLOGY  Pertinent labs & imaging results that were available during my care of the patient were reviewed by me and considered in my medical decision making (see chart for details). If possible, patient and/or family made aware of any abnormal findings.  Dg Chest 2 View  Result Date: 02/11/2018 CLINICAL DATA:  Shortness of breath. EXAM: CHEST - 2 VIEW COMPARISON:  Radiographs of April 05, 2015. FINDINGS: The heart size and mediastinal contours are within normal limits. Hyperexpansion of the lungs is noted. No pneumothorax or pleural effusion is noted. Stable right basilar scarring is noted. No acute pulmonary disease is noted. The visualized skeletal structures are unremarkable. IMPRESSION: No active cardiopulmonary disease. Electronically  Signed   By: Lupita RaiderJames  Green Jr, M.D.   On: 02/11/2018 13:50   ____________________________________________    PROCEDURES  Procedure(s) performed: None  Procedures  Critical Care performed: None  ____________________________________________   INITIAL IMPRESSION / ASSESSMENT AND PLAN / ED COURSE  Pertinent labs & imaging results that were available during my care of the patient were reviewed by me and considered in my medical decision making (see chart for details).  Patient with COPD ongoing tobacco abuse presents with severe cough and wheeze.  I do not think this represents ACS PE dissection myocarditis endocarditis pneumonia pneumothorax carditis pericarditis etc.  This is very consistent with COPD.  She is not having any chest pain or leg swelling.  Nothing to suggest CHF.  I have given her a continuous nebulizer here she is breathing clearly for the first time in a week she states and looks well.  ----------------------------------------- 5:00 PM on 02/11/2018 -----------------------------------------   She has oxygen saturations that are 87 when she is sitting still work of breathing is greatly improved she feels that she can breathe for the first time this week however, I cannot send a COPD patient home with sats in the mid 80s and she understands this we have placed her on oxygen they have come up, we will admit her for further observation and care from the hospital service. Troponin pending signed out to dr brown    ____________________________________________   FINAL CLINICAL IMPRESSION(S) / ED DIAGNOSES  Final diagnoses:  None      This chart was dictated using voice recognition software.  Despite best efforts to proofread,  errors can occur which can change meaning.      Jeanmarie PlantMcShane, Cynda Soule A, MD 02/11/18 [id number]    Jeanmarie PlantMcShane, Stace Peace A, MD 02/11/18 1700    Jeanmarie PlantMcShane, Layden Caterino A, MD 02/11/18 1806

## 2018-02-11 NOTE — H&P (Signed)
[facility] - [place] at [facility]   PATIENT NAME: Tasha Nichols    MR#:  [id number]  DATE OF BIRTH:  1965/11/26  DATE OF ADMISSION:  02/11/2018  PRIMARY CARE PHYSICIAN: Margaretann LovelessBurnette, Jennifer M, PA-C   REQUESTING/REFERRING PHYSICIAN: Dr. Alphonzo LemmingsMcShane  CHIEF COMPLAINT:   Chief Complaint  Patient presents with  . Shortness of Breath    HISTORY OF PRESENT ILLNESS:  Tasha Nichols  is a 53 y.o. female with a known history of hypertension, chronic pain syndrome, smoking, no history of COPD but long smoking history presents to the hospital due to 1 week of shortness of breath, chest congestion and wheezing.  Patient here in the emergency room received multiple nebulizers and IV steroids.  Saturations dropped to 87% on room air.  With acute respiratory failure, continuing shortness of breath and wheezing patient is being admitted to the hospital.  Patient continues to smoke at this point.  No inhalers at home. She has finished a Z-Pak and prednisone taper as outpatient with no significant improvement.  PAST MEDICAL HISTORY:   Past Medical History:  Diagnosis Date  . Arthritis   . Chronic pain   . GERD (gastroesophageal reflux disease)   . Hypertension   . Thyroid disease     PAST SURGICAL HISTORY:   Past Surgical History:  Procedure Laterality Date  . ABDOMINAL HYSTERECTOMY  2001  . CESAREAN SECTION  1994  . JOINT REPLACEMENT Right 2005, 2010   hip  . PAIN PUMP IMPLANTATION  2008, 2014   Dr Court Joyauch  . TONSILLECTOMY  1970    SOCIAL HISTORY:   Social History   Tobacco Use  . Smoking status: Current Some Day Smoker    Packs/day: 0.25  . Smokeless tobacco: Never Used  Substance Use Topics  . Alcohol use: No    FAMILY HISTORY:   Family History  Problem Relation Age of Onset  . Diabetes Father   . Breast cancer Neg Hx     DRUG ALLERGIES:   Allergies  Allergen Reactions  . Morphine Itching and Nausea And Vomiting  . Penicillins Rash    REVIEW OF  SYSTEMS:   Review of Systems  Constitutional: Positive for chills and malaise/fatigue. Negative for fever and weight loss.  HENT: Negative for hearing loss and nosebleeds.   Eyes: Negative for blurred vision, double vision and pain.  Respiratory: Positive for cough, sputum production, shortness of breath and wheezing. Negative for hemoptysis.   Cardiovascular: Negative for chest pain, palpitations, orthopnea and leg swelling.  Gastrointestinal: Negative for abdominal pain, constipation, diarrhea, nausea and vomiting.  Genitourinary: Negative for dysuria and hematuria.  Musculoskeletal: Negative for back pain, falls and myalgias.  Skin: Negative for rash.  Neurological: Negative for dizziness, tremors, sensory change, speech change, focal weakness, seizures and headaches.  Endo/Heme/Allergies: Does not bruise/bleed easily.  Psychiatric/Behavioral: Negative for depression and memory loss. The patient is not nervous/anxious.     MEDICATIONS AT HOME:   Prior to Admission medications   Medication Sig Start Date End Date Taking? Authorizing Provider  buPROPion (WELLBUTRIN SR) 150 MG 12 hr tablet Take 1 tablet (150 mg total) by mouth 2 (two) times daily. 10/06/17   Margaretann LovelessBurnette, Jennifer M, PA-C  HYDROcodone-acetaminophen (NORCO/VICODIN) 5-325 MG tablet Take by mouth. 1 tablet 3 times a day as needed for pain 03/07/15   [provider]  ibuprofen (ADVIL,MOTRIN) 200 MG tablet Take 200 mg by mouth every 6 (six) hours as needed.    [provider]  levothyroxine (SYNTHROID,  LEVOTHROID) 100 MCG tablet Take 1 tablet (100 mcg total) by mouth daily. 10/06/17   Margaretann Loveless, PA-C  PAIN MANAGEMENT IT PUMP REFILL 1 each by Intrathecal route once.    [provider]  potassium chloride SA (K-DUR,KLOR-CON) 20 MEQ tablet Take 1 tablet (20 mEq total) by mouth daily. 10/07/17   Margaretann Loveless, PA-C  triamterene-hydrochlorothiazide (DYAZIDE) 37.5-25 MG capsule Take 1 each (1  capsule total) by mouth daily. 10/06/17   Margaretann Loveless, PA-C     VITAL SIGNS:  Blood pressure (!) 150/82, pulse 82, temperature 98.4 F (36.9 C), temperature source Oral, resp. rate (!) 21, height 5\' 4"  (1.626 m), weight 67.1 kg, SpO2 (!) 89 %.  PHYSICAL EXAMINATION:  Physical Exam  GENERAL:  53 y.o.-year-old patient lying in the bed with conversational dyspnea EYES: Pupils equal, round, reactive to light and accommodation. No scleral icterus. Extraocular muscles intact.  HEENT: Head atraumatic, normocephalic. Oropharynx and nasopharynx clear. No oropharyngeal erythema, moist oral mucosa  NECK:  Supple, no jugular venous distention. No thyroid enlargement, no tenderness.  LUNGS: Bilateral wheezing and decreased air entry CARDIOVASCULAR: S1, S2 normal. No murmurs, rubs, or gallops.  ABDOMEN: Soft, nontender, nondistended. Bowel sounds present. No organomegaly or mass.  EXTREMITIES: No pedal edema, cyanosis, or clubbing. + 2 pedal & radial pulses b/l.   NEUROLOGIC: Cranial nerves II through XII are intact. No focal Motor or sensory deficits appreciated b/l PSYCHIATRIC: The patient is alert and oriented x 3.  Anxious SKIN: No obvious rash, lesion, or ulcer.   LABORATORY PANEL:   CBC Recent Labs  Lab 02/11/18 1102  WBC 8.6  HGB 15.6*  HCT 46.2*  PLT 212   ------------------------------------------------------------------------------------------------------------------  Chemistries  Recent Labs  Lab 02/11/18 1102  NA 139  K 3.3*  CL 104  CO2 27  GLUCOSE 132*  BUN 17  CREATININE 1.27*  CALCIUM 9.0   ------------------------------------------------------------------------------------------------------------------  Cardiac Enzymes No results for input(s): TROPONINI in the last 168 hours. ------------------------------------------------------------------------------------------------------------------  RADIOLOGY:  Dg Chest 2 View  Result Date: 02/11/2018 CLINICAL  DATA:  Shortness of breath. EXAM: CHEST - 2 VIEW COMPARISON:  Radiographs of April 05, 2015. FINDINGS: The heart size and mediastinal contours are within normal limits. Hyperexpansion of the lungs is noted. No pneumothorax or pleural effusion is noted. Stable right basilar scarring is noted. No acute pulmonary disease is noted. The visualized skeletal structures are unremarkable. IMPRESSION: No active cardiopulmonary disease. Electronically Signed   By: Lupita Raider, M.D.   On: 02/11/2018 13:50    IMPRESSION AND PLAN:   *Acute bronchitis with likely underlying COPD.  Acute hypoxic respiratory failure.  No infiltrates on chest x-ray.  Afebrile. Will check influenza PCR.  Unlikely to be treated as this is more than 48 hours.  But would like to check since patient will be admitted and will have contact with multiple healthcare providers. -IV steroids - Scheduled Nebulizers - Inhalers -Wean O2 as tolerated - Consult pulmonary if no improvement  *Hypertension.  Continue home medications  *Chronic right hip pain with pain pump in place.  Pain well controlled with the pain pump.  *DVT prophylaxis with Lovenox   All the records are reviewed and case discussed with ED provider. Management plans discussed with the patient, family and they are in agreement.  CODE STATUS: Full code  TOTAL TIME TAKING CARE OF THIS PATIENT: 35 minutes.   Orie Fisherman M.D on 02/11/2018 at 5:24 PM  Between 7am to 6pm - Pager -  [phone]  After 6pm go to [email or web address] - password [email or web address] [facility]  [facility]  Office  [phone]  CC: Primary care physician; Margaretann LovelessBurnette, Jennifer M, PA-C  Note: This dictation was prepared with Dragon dictation along with smaller phrase technology. Any transcriptional errors that result from this process are unintentional.

## 2018-02-12 LAB — CBC
HEMATOCRIT: 42.6 % (ref 36.0–46.0)
HEMOGLOBIN: 14.3 g/dL (ref 12.0–15.0)
MCH: 29.9 pg (ref 26.0–34.0)
MCHC: 33.6 g/dL (ref 30.0–36.0)
MCV: 88.9 fL (ref 80.0–100.0)
Platelets: 209 10*3/uL (ref 150–400)
RBC: 4.79 MIL/uL (ref 3.87–5.11)
RDW: 11.9 % (ref 11.5–15.5)
WBC: 6.4 10*3/uL (ref 4.0–10.5)
nRBC: 0 % (ref 0.0–0.2)

## 2018-02-12 LAB — BASIC METABOLIC PANEL
ANION GAP: 7 (ref 5–15)
BUN: 20 mg/dL (ref 6–20)
CHLORIDE: 105 mmol/L (ref 98–111)
CO2: 27 mmol/L (ref 22–32)
Calcium: 8.8 mg/dL — ABNORMAL LOW (ref 8.9–10.3)
Creatinine, Ser: 1.26 mg/dL — ABNORMAL HIGH (ref 0.44–1.00)
GFR calc Af Amer: 56 mL/min — ABNORMAL LOW (ref >60)
GFR calc non Af Amer: 48 mL/min — ABNORMAL LOW (ref >60)
Glucose, Bld: 163 mg/dL — ABNORMAL HIGH (ref 70–99)
POTASSIUM: 4.1 mmol/L (ref 3.5–5.1)
Sodium: 139 mmol/L (ref 135–145)

## 2018-02-12 LAB — MAGNESIUM: Magnesium: 2.2 mg/dL (ref 1.7–2.4)

## 2018-02-12 MED ORDER — OSELTAMIVIR PHOSPHATE 30 MG PO CAPS
30.0000 mg | ORAL_CAPSULE | Freq: Two times a day (BID) | ORAL | Status: DC
  Administered 2018-02-12 – 2018-02-14 (×4): 30 mg via ORAL
  Filled 2018-02-12 (×5): qty 1

## 2018-02-12 MED ORDER — OSELTAMIVIR PHOSPHATE 75 MG PO CAPS
75.0000 mg | ORAL_CAPSULE | Freq: Two times a day (BID) | ORAL | Status: DC
  Administered 2018-02-12: 75 mg via ORAL
  Filled 2018-02-12 (×2): qty 1

## 2018-02-12 NOTE — Progress Notes (Signed)
[facility] - [place] at [facility]   PATIENT NAME: Tasha Nichols    MR#:  [id number]  DATE OF BIRTH:  1966/01/22  SUBJECTIVE:  CHIEF COMPLAINT:   Chief Complaint  Patient presents with  . Shortness of Breath   Patient has better shortness of breath, on oxygen 3 L. REVIEW OF SYSTEMS:  Review of Systems  Constitutional: Negative for chills, fever and malaise/fatigue.  HENT: Negative for sore throat.   Eyes: Negative for blurred vision and double vision.  Respiratory: Positive for shortness of breath. Negative for cough, hemoptysis, sputum production, wheezing and stridor.   Cardiovascular: Negative for chest pain, palpitations, orthopnea and leg swelling.  Gastrointestinal: Negative for abdominal pain, blood in stool, diarrhea, melena, nausea and vomiting.  Genitourinary: Negative for dysuria, flank pain and hematuria.  Musculoskeletal: Negative for back pain and joint pain.  Skin: Negative for rash.  Neurological: Negative for dizziness, sensory change, focal weakness, seizures, loss of consciousness, weakness and headaches.  Endo/Heme/Allergies: Negative for polydipsia.  Psychiatric/Behavioral: Negative for depression. The patient is not nervous/anxious.     DRUG ALLERGIES:   Allergies  Allergen Reactions  . Morphine Itching and Nausea And Vomiting  . Penicillins Rash   VITALS:  Blood pressure 133/83, pulse 63, temperature 98.4 F (36.9 C), temperature source Oral, resp. rate 20, height 5\' 4"  (1.626 m), weight 65.7 kg, SpO2 99 %. PHYSICAL EXAMINATION:  Physical Exam Constitutional:      General: She is not in acute distress. HENT:     Head: Normocephalic.     Mouth/Throat:     Mouth: Mucous membranes are moist.  Eyes:     General: No scleral icterus.    Conjunctiva/sclera: Conjunctivae normal.     Pupils: Pupils are equal, round, and reactive to light.  Neck:     Musculoskeletal: Normal range of motion and neck supple.     Vascular: No JVD.       Trachea: No tracheal deviation.  Cardiovascular:     Rate and Rhythm: Normal rate and regular rhythm.     Heart sounds: Normal heart sounds. No murmur. No gallop.   Pulmonary:     Effort: Pulmonary effort is normal. No respiratory distress.     Breath sounds: No stridor. No wheezing, rhonchi or rales.     Comments: Diminished breath sounds. Abdominal:     General: Bowel sounds are normal. There is no distension.     Palpations: Abdomen is soft.     Tenderness: There is no abdominal tenderness. There is no rebound.  Musculoskeletal: Normal range of motion.        General: No tenderness.     Right lower leg: No edema.     Left lower leg: No edema.  Skin:    Findings: No erythema or rash.  Neurological:     General: No focal deficit present.     Mental Status: She is alert and oriented to person, place, and time.     Cranial Nerves: No cranial nerve deficit.  Psychiatric:        Mood and Affect: Mood normal.    LABORATORY PANEL:  Female CBC Recent Labs  Lab 02/12/18 0557  WBC 6.4  HGB 14.3  HCT 42.6  PLT 209   ------------------------------------------------------------------------------------------------------------------ Chemistries  Recent Labs  Lab 02/12/18 0557  NA 139  K 4.1  CL 105  CO2 27  GLUCOSE 163*  BUN 20  CREATININE 1.26*  CALCIUM 8.8*   RADIOLOGY:  No results found.  ASSESSMENT AND PLAN:   *Acute respiratory failure with hypoxia due to influenza A and bronchitis  No infiltrates on chest x-ray.  Taper IV steroids, continue nebulizers -Wean O2 as tolerated  *Hypertension.  Continue Maxzide.  *Chronic right hip pain with pain pump in place.  Pain well controlled with the pain pump.  Tobacco abuse.  Smoking cessation was counseled for 3 to 4 minutes.  The patient does not want nicotine patch.  All the records are reviewed and case discussed with Care Management/Social Worker. Management plans discussed with the patient, family and they  are in agreement.  CODE STATUS: Full Code  TOTAL TIME TAKING CARE OF THIS PATIENT: 27 minutes.   More than 50% of the time was spent in counseling/coordination of care: YES  POSSIBLE D/C IN 2 DAYS, DEPENDING ON CLINICAL CONDITION.   Shaune PollackQing Donielle Kaigler M.D on 02/12/2018 at 1:47 PM  Between 7am to 6pm - Pager - [phone]  After 6pm go to [email or web address] - Therapist, [email or web address] [email or web address]  [facility] [place] [facility]

## 2018-02-13 LAB — HIV ANTIBODY (ROUTINE TESTING W REFLEX): HIV SCREEN 4TH GENERATION: NONREACTIVE

## 2018-02-13 MED ORDER — MOMETASONE FURO-FORMOTEROL FUM 100-5 MCG/ACT IN AERO
2.0000 | INHALATION_SPRAY | Freq: Two times a day (BID) | RESPIRATORY_TRACT | Status: DC
  Administered 2018-02-13 – 2018-02-14 (×3): 2 via RESPIRATORY_TRACT
  Filled 2018-02-13: qty 8.8

## 2018-02-13 NOTE — Progress Notes (Signed)
[employer] - [place] at [facility]   PATIENT NAME: Tasha Nichols    MR#:  [id number]  DATE OF BIRTH:  Jun 16, 1965  SUBJECTIVE:  CHIEF COMPLAINT:   Chief Complaint  Patient presents with  . Shortness of Breath   Patien tstill has shortness of breath, on oxygen 2 L.  Hypoxia with O2 saturation down to 83% in room air. REVIEW OF SYSTEMS:  Review of Systems  Constitutional: Negative for chills, fever and malaise/fatigue.  HENT: Negative for sore throat.   Eyes: Negative for blurred vision and double vision.  Respiratory: Positive for shortness of breath. Negative for cough, hemoptysis, sputum production, wheezing and stridor.   Cardiovascular: Negative for chest pain, palpitations, orthopnea and leg swelling.  Gastrointestinal: Negative for abdominal pain, blood in stool, diarrhea, melena, nausea and vomiting.  Genitourinary: Negative for dysuria, flank pain and hematuria.  Musculoskeletal: Negative for back pain and joint pain.  Skin: Negative for rash.  Neurological: Negative for dizziness, sensory change, focal weakness, seizures, loss of consciousness, weakness and headaches.  Endo/Heme/Allergies: Negative for polydipsia.  Psychiatric/Behavioral: Negative for depression. The patient is not nervous/anxious.     DRUG ALLERGIES:   Allergies  Allergen Reactions  . Morphine Itching and Nausea And Vomiting  . Penicillins Rash   VITALS:  Blood pressure 140/84, pulse 90, temperature 98 F (36.7 C), temperature source Oral, resp. rate 16, height 5\' 4"  (1.626 m), weight 66 kg, SpO2 90 %. PHYSICAL EXAMINATION:  Physical Exam Constitutional:      General: She is not in acute distress. HENT:     Head: Normocephalic.     Mouth/Throat:     Mouth: Mucous membranes are moist.  Eyes:     General: No scleral icterus.    Conjunctiva/sclera: Conjunctivae normal.     Pupils: Pupils are equal, round, and reactive to light.  Neck:     Musculoskeletal: Normal range of  motion and neck supple.     Vascular: No JVD.     Trachea: No tracheal deviation.  Cardiovascular:     Rate and Rhythm: Normal rate and regular rhythm.     Heart sounds: Normal heart sounds. No murmur. No gallop.   Pulmonary:     Effort: Pulmonary effort is normal. No respiratory distress.     Breath sounds: No stridor. No wheezing, rhonchi or rales.     Comments: Diminished breath sounds. Abdominal:     General: Bowel sounds are normal. There is no distension.     Palpations: Abdomen is soft.     Tenderness: There is no abdominal tenderness. There is no rebound.  Musculoskeletal: Normal range of motion.        General: No tenderness.     Right lower leg: No edema.     Left lower leg: No edema.  Skin:    Findings: No erythema or rash.  Neurological:     General: No focal deficit present.     Mental Status: She is alert and oriented to person, place, and time.     Cranial Nerves: No cranial nerve deficit.  Psychiatric:        Mood and Affect: Mood normal.    LABORATORY PANEL:  Female CBC Recent Labs  Lab 02/12/18 0557  WBC 6.4  HGB 14.3  HCT 42.6  PLT 209   ------------------------------------------------------------------------------------------------------------------ Chemistries  Recent Labs  Lab 02/12/18 0557 02/12/18 1353  NA 139  --   K 4.1  --   CL 105  --  CO2 27  --   GLUCOSE 163*  --   BUN 20  --   CREATININE 1.26*  --   CALCIUM 8.8*  --   MG  --  2.2   RADIOLOGY:  No results found. ASSESSMENT AND PLAN:   *Acute respiratory failure with hypoxia due to influenza A and bronchitis  No infiltrates on chest x-ray.  Taper IV steroids, continue nebulizers -Wean O2 as tolerated  *Hypertension.  Continue Maxzide.  *Chronic right hip pain with pain pump in place.  Pain well controlled with the pain pump.  Tobacco abuse.  Smoking cessation was counseled for 3 to 4 minutes.  The patient does not want nicotine patch.  All the records are reviewed and  case discussed with Care Management/Social Worker. Management plans discussed with the patient, family and they are in agreement.  CODE STATUS: Full Code  TOTAL TIME TAKING CARE OF THIS PATIENT: 25 minutes.   More than 50% of the time was spent in counseling/coordination of care: YES  POSSIBLE D/C IN 1-2 DAYS, DEPENDING ON CLINICAL CONDITION.   Shaune Pollack M.D on 02/13/2018 at 3:44 PM  Between 7am to 6pm - Pager - [phone]  After 6pm go to [email or web address] - Therapist, nutritional Hospitalists

## 2018-02-13 NOTE — Progress Notes (Signed)
SATURATION QUALIFICATIONS:  Patient Saturations on Room Air at Rest = 91%  Patient Saturations on Room Air while Ambulating = 83%  Patient Saturations on 2 Liters of oxygen while Ambulating = 91%  Tasha Nichols [employer]

## 2018-02-14 MED ORDER — PREDNISONE 20 MG PO TABS: 40.0000 mg | ORAL_TABLET | Freq: Every day | ORAL | 0 refills | Status: DC

## 2018-02-14 MED ORDER — PREDNISONE 20 MG PO TABS: 40.0000 mg | ORAL_TABLET | Freq: Every day | ORAL | Status: DC

## 2018-02-14 MED ORDER — OSELTAMIVIR PHOSPHATE 30 MG PO CAPS: 30.0000 mg | ORAL_CAPSULE | Freq: Two times a day (BID) | ORAL | 0 refills | Status: DC

## 2018-02-14 MED ORDER — MOMETASONE FURO-FORMOTEROL FUM 100-5 MCG/ACT IN AERO: 2.0000 | INHALATION_SPRAY | Freq: Two times a day (BID) | RESPIRATORY_TRACT | 2 refills | Status: DC

## 2018-02-14 MED ORDER — BENZONATATE 100 MG PO CAPS: 100.0000 mg | ORAL_CAPSULE | Freq: Three times a day (TID) | ORAL | 0 refills | Status: DC | PRN

## 2018-02-14 MED ORDER — ALBUTEROL SULFATE HFA 108 (90 BASE) MCG/ACT IN AERS: 2.0000 | INHALATION_SPRAY | Freq: Four times a day (QID) | RESPIRATORY_TRACT | 2 refills | Status: DC | PRN

## 2018-02-14 NOTE — Progress Notes (Signed)
Pt discharged per MD order. IV removed. Discharge instructions reviewed with pt. Pt verbalized understanding of instructions. Prescriptions given to pt. Pt taken to car in wheelchair by volunteer.

## 2018-02-14 NOTE — Discharge Instructions (Signed)
Smoking cessation  

## 2018-02-14 NOTE — Discharge Summary (Signed)
[employer] - [place] at [facility]   PATIENT NAME: Tasha Nichols    MR#:  [id number]  DATE OF BIRTH:  [birth year]  DATE OF ADMISSION:  02/11/2018   ADMITTING PHYSICIAN: Milagros Loll, MD  DATE OF DISCHARGE: 02/14/2018  PRIMARY CARE PHYSICIAN: Margaretann Loveless, PA-C   ADMISSION DIAGNOSIS:  COPD exacerbation (HCC) [J44.1] DISCHARGE DIAGNOSIS:  Active Problems:   Acute bronchitis  SECONDARY DIAGNOSIS:   Past Medical History:  Diagnosis Date  . Arthritis   . Chronic pain   . GERD (gastroesophageal reflux disease)   . Hypertension   . Thyroid disease    HOSPITAL COURSE:  *Acute respiratory failure with hypoxia due to influenza A and bronchitis  No infiltrates on chest x-ray.  The patient has been treated with IV steroids and then changed to prednisone, she is also treated with Tamiflu, DuoNeb and Dulera. She was on oxygen 3 L by nasal cannula, which was weaned off today.  *Hypertension. Continue Maxzide.  *Chronic right hip pain with pain pump in place. Pain well controlled with the pain pump.  Tobacco abuse.  Smoking cessation was counseled for 3 to 4 minutes.  The patient does not want nicotine patch. DISCHARGE CONDITIONS:  Stable, discharge to home today. CONSULTS OBTAINED:   DRUG ALLERGIES:   Allergies  Allergen Reactions  . Morphine Itching and Nausea And Vomiting  . Penicillins Rash   DISCHARGE MEDICATIONS:   Allergies as of 02/14/2018      Reactions   Morphine Itching, Nausea And Vomiting   Penicillins Rash      Medication List    TAKE these medications   albuterol 108 (90 Base) MCG/ACT inhaler Commonly known as:  PROVENTIL HFA;VENTOLIN HFA Inhale 2 puffs into the lungs every 6 (six) hours as needed for wheezing or shortness of breath.   benzonatate 100 MG capsule Commonly known as:  TESSALON PERLES Take 1 capsule (100 mg total) by mouth 3 (three) times daily as needed for cough.   buPROPion 150 MG 12 hr tablet Commonly  known as:  WELLBUTRIN SR Take 1 tablet (150 mg total) by mouth 2 (two) times daily.   HYDROcodone-acetaminophen 5-325 MG tablet Commonly known as:  NORCO/VICODIN Take by mouth. 1 tablet 3 times a day as needed for pain   ibuprofen 200 MG tablet Commonly known as:  ADVIL,MOTRIN Take 200 mg by mouth every 6 (six) hours as needed.   levothyroxine 100 MCG tablet Commonly known as:  SYNTHROID, LEVOTHROID Take 1 tablet (100 mcg total) by mouth daily.   mometasone-formoterol 100-5 MCG/ACT Aero Commonly known as:  DULERA Inhale 2 puffs into the lungs 2 (two) times daily.   oseltamivir 30 MG capsule Commonly known as:  TAMIFLU Take 1 capsule (30 mg total) by mouth 2 (two) times daily.   potassium chloride SA 20 MEQ tablet Commonly known as:  K-DUR,KLOR-CON Take 1 tablet (20 mEq total) by mouth daily.   predniSONE 20 MG tablet Commonly known as:  DELTASONE Take 2 tablets (40 mg total) by mouth daily with breakfast. Start taking on:  February 15, 2018   triamterene-hydrochlorothiazide 37.5-25 MG capsule Commonly known as:  DYAZIDE Take 1 each (1 capsule total) by mouth daily.        DISCHARGE INSTRUCTIONS:  See AVS.  If you experience worsening of your admission symptoms, develop shortness of breath, life threatening emergency, suicidal or homicidal thoughts you must seek medical attention immediately by calling 911 or calling your MD immediately  if symptoms less  severe.  You Must read complete instructions/literature along with all the possible adverse reactions/side effects for all the Medicines you take and that have been prescribed to you. Take any new Medicines after you have completely understood and accpet all the possible adverse reactions/side effects.   Please note  You were cared for by a hospitalist during your hospital stay. If you have any questions about your discharge medications or the care you received while you were in the hospital after you are discharged, you  can call the unit and asked to speak with the hospitalist on call if the hospitalist that took care of you is not available. Once you are discharged, your primary care physician will handle any further medical issues. Please note that NO REFILLS for any discharge medications will be authorized once you are discharged, as it is imperative that you return to your primary care physician (or establish a relationship with a primary care physician if you do not have one) for your aftercare needs so that they can reassess your need for medications and monitor your lab values.    On the day of Discharge:  VITAL SIGNS:  Blood pressure (!) 156/99, pulse 96, temperature 97.6 F (36.4 C), temperature source Oral, resp. rate 20, height 5\' 4"  (1.626 m), weight 65.3 kg, SpO2 95 %. PHYSICAL EXAMINATION:  GENERAL:  53 y.o.-year-old patient lying in the bed with no acute distress.  EYES: Pupils equal, round, reactive to light and accommodation. No scleral icterus. Extraocular muscles intact.  HEENT: Head atraumatic, normocephalic. Oropharynx and nasopharynx clear.  NECK:  Supple, no jugular venous distention. No thyroid enlargement, no tenderness.  LUNGS: Normal breath sounds bilaterally, no wheezing, rales,rhonchi or crepitation. No use of accessory muscles of respiration.  CARDIOVASCULAR: S1, S2 normal. No murmurs, rubs, or gallops.  ABDOMEN: Soft, non-tender, non-distended. Bowel sounds present. No organomegaly or mass.  EXTREMITIES: No pedal edema, cyanosis, or clubbing.  NEUROLOGIC: Cranial nerves II through XII are intact. Muscle strength 5/5 in all extremities. Sensation intact. Gait not checked.  PSYCHIATRIC: The patient is alert and oriented x 3.  SKIN: No obvious rash, lesion, or ulcer.  DATA REVIEW:   CBC Recent Labs  Lab 02/12/18 0557  WBC 6.4  HGB 14.3  HCT 42.6  PLT 209    Chemistries  Recent Labs  Lab 02/12/18 0557 02/12/18 1353  NA 139  --   K 4.1  --   CL 105  --   CO2 27  --     GLUCOSE 163*  --   BUN 20  --   CREATININE 1.26*  --   CALCIUM 8.8*  --   MG  --  2.2     Microbiology Results  No results found for this or any previous visit.  RADIOLOGY:  No results found.   Management plans discussed with the patient, family and they are in agreement.  CODE STATUS: Full Code   TOTAL TIME TAKING CARE OF THIS PATIENT: 33 minutes.    Shaune Pollack M.D on 02/14/2018 at 12:54 PM  Between 7am to 6pm - Pager - [phone]  After 6pm go to [email or web address] - password [email or web address]  [facility] [place] [facility]  Office  [phone]  CC: Primary care physician; Margaretann Loveless, PA-C   Note: This dictation was prepared with Dragon dictation along with smaller phrase technology. Any transcriptional errors that result from this process are unintentional.

## 2018-02-15 ENCOUNTER — Telehealth: Payer: Self-pay

## 2018-02-15 NOTE — Telephone Encounter (Signed)
Transition Care Management Follow-up Telephone Call  Date of discharge and from where: [facility] on 02/14/18.  How have you been since you were released from the hospital? Doing better, still coughing and now seeing white sputum. Declines pain, fever or n/v/d.  Any questions or concerns? No   Items Reviewed:  Did the pt receive and understand the discharge instructions provided? Yes   Medications obtained and verified? Yes, confirmed new meds only.  Any new allergies since your discharge? No   Dietary orders reviewed? Yes  Do you have support at home? Yes   Other (ie: DME, Home Health, etc) N/A  Functional Questionnaire: (I = Independent and D = Dependent)  Bathing/Dressing- I   Meal Prep- I  Eating- I  Maintaining continence- I  Transferring/Ambulation- I  Managing Meds- I   Follow up appointments reviewed:    PCP Hospital f/u appt confirmed? Yes  Scheduled to see Joycelyn Man on 02/17/18 @ 11:00 AM.  Specialist Hospital f/u appt confirmed? No .  Are transportation arrangements needed? No   If their condition worsens, is the pt aware to call  their PCP or go to the ED? Yes  Was the patient provided with contact information for the PCP's office or ED? Yes  Was the pt encouraged to call back with questions or concerns? Yes

## 2018-02-17 ENCOUNTER — Encounter: Payer: Self-pay | Admitting: Physician Assistant

## 2018-02-17 ENCOUNTER — Ambulatory Visit (INDEPENDENT_AMBULATORY_CARE_PROVIDER_SITE_OTHER): Payer: BLUE CROSS/BLUE SHIELD | Admitting: Physician Assistant

## 2018-02-17 VITALS — BP 123/87 | HR 111 | Temp 98.1°F | Resp 16 | Ht 64.0 in | Wt 140.0 lb

## 2018-02-17 DIAGNOSIS — J101 Influenza due to other identified influenza virus with other respiratory manifestations: Secondary | ICD-10-CM

## 2018-02-17 DIAGNOSIS — J440 Chronic obstructive pulmonary disease with acute lower respiratory infection: Secondary | ICD-10-CM | POA: Diagnosis not present

## 2018-02-17 DIAGNOSIS — IMO0001 Reserved for inherently not codable concepts without codable children: Secondary | ICD-10-CM

## 2018-02-17 DIAGNOSIS — Z9189 Other specified personal risk factors, not elsewhere classified: Secondary | ICD-10-CM | POA: Diagnosis not present

## 2018-02-17 DIAGNOSIS — J9601 Acute respiratory failure with hypoxia: Secondary | ICD-10-CM | POA: Diagnosis not present

## 2018-02-17 DIAGNOSIS — J209 Acute bronchitis, unspecified: Secondary | ICD-10-CM

## 2018-02-17 MED ORDER — MOMETASONE FURO-FORMOTEROL FUM 100-5 MCG/ACT IN AERO: 2.0000 | INHALATION_SPRAY | Freq: Two times a day (BID) | RESPIRATORY_TRACT | 2 refills | Status: DC

## 2018-02-17 NOTE — Progress Notes (Signed)
[name] Female    DOB: 01/31/1966   53 y.o.   MRN: [id number] Visit Date: 02/17/2018  Today's Provider: Margaretann LovelessJennifer M Burnette, PA-C   Chief Complaint  Patient presents with  . Hospitalization Follow-up   Subjective:     HPI    Follow up Hospitalization  Patient was admitted to [facility] on 02/11/2018 and discharged on 02/14/18. She was treated for Acute respiratory failure with hypoxia due to influenza A and bronchitis (COPD exacerbation) .  Treatment for this included; No infiltrates on chest x-ray.   The patient has been treated with IV steroids and then changed to prednisone, she is also treated with Tamiflu, DuoNeb and Dulera.  She was on oxygen 3 L by nasal cannula, which was weaned off on 02/14/18.  Hypertension. Continue Maxzide Telephone follow up was done on 02/15/18 She reports good compliance with treatment. She reports this condition is Improved. ---------------------------------------------------------------  Patient states she is improved since hospital discharge. Patient is still taking prednisone, tamiflu and using albuterol inhaler.   Allergies  Allergen Reactions  . Morphine Itching and Nausea And Vomiting  . Penicillins Rash     Current Outpatient Medications:  .  albuterol (PROVENTIL HFA;VENTOLIN HFA) 108 (90 Base) MCG/ACT inhaler, Inhale 2 puffs into the lungs every 6 (six) hours as needed for wheezing or shortness of breath., Disp: 1 Inhaler, Rfl: 2 .  buPROPion (WELLBUTRIN SR) 150 MG 12 hr tablet, Take 1 tablet (150 mg total) by mouth 2 (two) times daily., Disp: 180 tablet, Rfl: 1 .  HYDROcodone-acetaminophen (NORCO/VICODIN) 5-325 MG tablet, Take by mouth. 1 tablet 3 times a day as needed for pain, Disp: , Rfl:  .  ibuprofen (ADVIL,MOTRIN) 200 MG tablet, Take 200 mg by mouth every 6 (six) hours as needed., Disp: , Rfl:  .  levothyroxine (SYNTHROID, LEVOTHROID) 100 MCG tablet, Take 1 tablet (100 mcg total) by mouth daily., Disp: 90 tablet,  Rfl: 3 .  oseltamivir (TAMIFLU) 30 MG capsule, Take 1 capsule (30 mg total) by mouth 2 (two) times daily., Disp: 10 capsule, Rfl: 0 .  potassium chloride SA (K-DUR,KLOR-CON) 20 MEQ tablet, Take 1 tablet (20 mEq total) by mouth daily., Disp: 30 tablet, Rfl: 3 .  predniSONE (DELTASONE) 20 MG tablet, Take 2 tablets (40 mg total) by mouth daily with breakfast., Disp: 10 tablet, Rfl: 0 .  triamterene-hydrochlorothiazide (DYAZIDE) 37.5-25 MG capsule, Take 1 each (1 capsule total) by mouth daily., Disp: 90 capsule, Rfl: 3 .  benzonatate (TESSALON PERLES) 100 MG capsule, Take 1 capsule (100 mg total) by mouth 3 (three) times daily as needed for cough. (Patient not taking: Reported on 02/17/2018), Disp: 30 capsule, Rfl: 0 .  mometasone-formoterol (DULERA) 100-5 MCG/ACT AERO, Inhale 2 puffs into the lungs 2 (two) times daily. (Patient not taking: Reported on 02/17/2018), Disp: 1 Inhaler, Rfl: 2  Review of Systems  Constitutional: Negative for appetite change, chills, fatigue and fever.  HENT: Negative.   Respiratory: Positive for cough. Negative for chest tightness, shortness of breath and wheezing.   Cardiovascular: Negative for chest pain and palpitations.  Gastrointestinal: Negative for abdominal pain, nausea and vomiting.  Neurological: Negative for dizziness and weakness.    Social History   Tobacco Use  . Smoking status: Current Some Day Smoker    Packs/day: 0.25  . Smokeless tobacco: Never Used  Substance Use Topics  . Alcohol use: No      Objective:   BP 123/87 (BP Location: Right Arm,  Patient Position: Sitting, Cuff Size: Large)   Pulse (!) 111   Temp 98.1 F (36.7 C) (Oral)   Resp 16   Ht 5\' 4"  (1.626 m)   Wt 140 lb (63.5 kg)   SpO2 96%   BMI 24.03 kg/m  Vitals:   02/17/18 1118  BP: 123/87  Pulse: (!) 111  Resp: 16  Temp: 98.1 F (36.7 C)  TempSrc: Oral  SpO2: 96%  Weight: 140 lb (63.5 kg)  Height: 5\' 4"  (1.626 m)     Physical Exam Vitals signs reviewed.    Constitutional:      General: She is not in acute distress.    Appearance: She is well-developed. She is not diaphoretic.  HENT:     Head: Normocephalic and atraumatic.     Right Ear: Hearing, tympanic membrane, ear canal and external ear normal.     Left Ear: Hearing, tympanic membrane, ear canal and external ear normal.     Nose: Nose normal.     Mouth/Throat:     Pharynx: Uvula midline. No oropharyngeal exudate.  Eyes:     General: No scleral icterus.       Right eye: No discharge.        Left eye: No discharge.     Conjunctiva/sclera: Conjunctivae normal.     Pupils: Pupils are equal, round, and reactive to light.  Neck:     Musculoskeletal: Normal range of motion and neck supple.     Thyroid: No thyromegaly.     Trachea: No tracheal deviation.  Cardiovascular:     Rate and Rhythm: Normal rate and regular rhythm.     Heart sounds: Normal heart sounds. No murmur. No friction rub. No gallop.   Pulmonary:     Effort: Pulmonary effort is normal. No respiratory distress.     Breath sounds: No stridor. Examination of the right-upper field reveals wheezing. Examination of the right-middle field reveals wheezing. Wheezing present. No rales.  Lymphadenopathy:     Cervical: No cervical adenopathy.  Skin:    General: Skin is warm and dry.        Assessment & Plan    1. Transition of care performed with sharing of clinical summary Hospitalization notes, imaging and labs all reviewed from hospitalization from 02/11/18-02/14/18. Transition phone call completed and reviewed on 02/15/18.  2. Acute respiratory failure with hypoxia [facility]) Patient still with decreased O2 saturation. Maintained between 90-92%. Walked for 3 minutes and maintained at 92-93%. Reports feeling much better. Continue medications (tamiflu until completed, prednisone until completed, discussed starting Dulera inhaler and using at least once daily but dosing was twice daily, and using albuterol inhaler prn). She voiced  understanding. Return precautions discussed. I will see her back in 2 weeks to recheck.   3. Influenza A See above medical treatment plan.  4. Acute bronchitis with COPD (HCC) See above medical treatment plan.     Margaretann Loveless, PA-C  [facility]

## 2018-03-03 ENCOUNTER — Encounter: Payer: Self-pay | Admitting: Physician Assistant

## 2018-03-03 ENCOUNTER — Ambulatory Visit (INDEPENDENT_AMBULATORY_CARE_PROVIDER_SITE_OTHER): Payer: BLUE CROSS/BLUE SHIELD | Admitting: Physician Assistant

## 2018-03-03 VITALS — BP 139/87 | HR 82 | Temp 98.0°F | Resp 16 | Wt 155.0 lb

## 2018-03-03 DIAGNOSIS — J449 Chronic obstructive pulmonary disease, unspecified: Secondary | ICD-10-CM | POA: Diagnosis not present

## 2018-03-03 DIAGNOSIS — F17200 Nicotine dependence, unspecified, uncomplicated: Secondary | ICD-10-CM | POA: Diagnosis not present

## 2018-03-03 DIAGNOSIS — J9601 Acute respiratory failure with hypoxia: Secondary | ICD-10-CM

## 2018-03-03 NOTE — Progress Notes (Signed)
[name] Female    DOB: [birth year]   53 y.o.   MRN: [id number] Visit Date: 03/03/2018  Today's Provider: Margaretann Loveless, PA-C   Chief Complaint  Patient presents with  . Follow-up   Subjective:     HPI   Acute respiratory failure with hypoxia (HCC) From 02/17/2018-advised to continue medications (tamiflu until completed, prednisone until completed, discussed starting Dulera inhaler and using at least once daily but dosing was twice daily, and using albuterol inhaler prn). She voiced understanding. Return precautions discussed. I will see her back in 2 weeks to recheck.   Continues to do well. Symptoms continue to improve and progress. O2 saturations greatly improved. She also continues to not smoke. She has not smoked since her hospitalization.   Allergies  Allergen Reactions  . Morphine Itching and Nausea And Vomiting  . Penicillins Rash     Current Outpatient Medications:  .  albuterol (PROVENTIL HFA;VENTOLIN HFA) 108 (90 Base) MCG/ACT inhaler, Inhale 2 puffs into the lungs every 6 (six) hours as needed for wheezing or shortness of breath., Disp: 1 Inhaler, Rfl: 2 .  buPROPion (WELLBUTRIN SR) 150 MG 12 hr tablet, Take 1 tablet (150 mg total) by mouth 2 (two) times daily., Disp: 180 tablet, Rfl: 1 .  HYDROcodone-acetaminophen (NORCO/VICODIN) 5-325 MG tablet, Take by mouth. 1 tablet 3 times a day as needed for pain, Disp: , Rfl:  .  ibuprofen (ADVIL,MOTRIN) 200 MG tablet, Take 200 mg by mouth every 6 (six) hours as needed., Disp: , Rfl:  .  levothyroxine (SYNTHROID, LEVOTHROID) 100 MCG tablet, Take 1 tablet (100 mcg total) by mouth daily., Disp: 90 tablet, Rfl: 3 .  mometasone-formoterol (DULERA) 100-5 MCG/ACT AERO, Inhale 2 puffs into the lungs 2 (two) times daily., Disp: 1 Inhaler, Rfl: 2 .  potassium chloride SA (K-DUR,KLOR-CON) 20 MEQ tablet, Take 1 tablet (20 mEq total) by mouth daily., Disp: 30 tablet, Rfl: 3 .  triamterene-hydrochlorothiazide  (DYAZIDE) 37.5-25 MG capsule, Take 1 each (1 capsule total) by mouth daily., Disp: 90 capsule, Rfl: 3 .  benzonatate (TESSALON PERLES) 100 MG capsule, Take 1 capsule (100 mg total) by mouth 3 (three) times daily as needed for cough. (Patient not taking: Reported on 02/17/2018), Disp: 30 capsule, Rfl: 0 .  oseltamivir (TAMIFLU) 30 MG capsule, Take 1 capsule (30 mg total) by mouth 2 (two) times daily. (Patient not taking: Reported on 03/03/2018), Disp: 10 capsule, Rfl: 0 .  predniSONE (DELTASONE) 20 MG tablet, Take 2 tablets (40 mg total) by mouth daily with breakfast. (Patient not taking: Reported on 03/03/2018), Disp: 10 tablet, Rfl: 0  Review of Systems  Constitutional: Negative for appetite change, chills, fatigue and fever.  HENT: Negative.   Respiratory: Negative for chest tightness and shortness of breath.   Cardiovascular: Negative for chest pain and palpitations.  Gastrointestinal: Negative for abdominal pain, nausea and vomiting.  Neurological: Negative for dizziness and weakness.    Social History   Tobacco Use  . Smoking status: Current Some Day Smoker    Packs/day: 0.25  . Smokeless tobacco: Never Used  Substance Use Topics  . Alcohol use: No      Objective:   BP 139/87 (BP Location: Left Arm, Patient Position: Sitting, Cuff Size: Large)   Pulse 82   Temp 98 F (36.7 C) (Oral)   Resp 16   Wt 155 lb (70.3 kg)   SpO2 98%   BMI 26.61 kg/m  Vitals:   03/03/18 1140  BP: 139/87  Pulse: 82  Resp: 16  Temp: 98 F (36.7 C)  TempSrc: Oral  SpO2: 98%  Weight: 155 lb (70.3 kg)     Physical Exam Vitals signs reviewed.  Constitutional:      General: She is not in acute distress.    Appearance: She is well-developed. She is not diaphoretic.  HENT:     Head: Normocephalic and atraumatic.     Right Ear: Hearing, tympanic membrane, ear canal and external ear normal.     Left Ear: Hearing, tympanic membrane, ear canal and external ear normal.     Nose: Nose normal.      Mouth/Throat:     Pharynx: Uvula midline. No oropharyngeal exudate.  Eyes:     General: No scleral icterus.       Right eye: No discharge.        Left eye: No discharge.     Conjunctiva/sclera: Conjunctivae normal.     Pupils: Pupils are equal, round, and reactive to light.  Neck:     Musculoskeletal: Normal range of motion and neck supple.     Thyroid: No thyromegaly.     Trachea: No tracheal deviation.  Cardiovascular:     Rate and Rhythm: Normal rate and regular rhythm.     Heart sounds: Normal heart sounds. No murmur. No friction rub. No gallop.   Pulmonary:     Effort: Pulmonary effort is normal. No respiratory distress.     Breath sounds: Normal breath sounds. No stridor. No wheezing or rales.  Lymphadenopathy:     Cervical: No cervical adenopathy.  Skin:    General: Skin is warm and dry.        Assessment & Plan    1. Acute respiratory failure with hypoxia (HCC) Much improved. Advised to continue [facility] daily and use albuterol prn. Call if symptoms return.   2. Chronic obstructive pulmonary disease, unspecified COPD type (HCC) Advised to continue [facility] for this.   3. Compulsive tobacco user syndrome Has not smoked since being hospitalized at the beginning of January. Reports having no cravings for it and feels well.      Margaretann Loveless, PA-C  [facility]

## 2018-03-03 NOTE — Patient Instructions (Signed)
Steps to Quit Smoking    Smoking tobacco can be bad for your health. It can also affect almost every organ in your body. Smoking puts you and people around you at risk for many serious long-lasting (chronic) diseases. Quitting smoking is hard, but it is one of the best things that you can do for your health. It is never too late to quit.  What are the benefits of quitting smoking?  When you quit smoking, you lower your risk for getting serious diseases and conditions. They can include:  · Lung cancer or lung disease.  · Heart disease.  · Stroke.  · Heart attack.  · Not being able to have children (infertility).  · Weak bones (osteoporosis) and broken bones (fractures).  If you have coughing, wheezing, and shortness of breath, those symptoms may get better when you quit. You may also get sick less often. If you are pregnant, quitting smoking can help to lower your chances of having a baby of low birth weight.  What can I do to help me quit smoking?  Talk with your doctor about what can help you quit smoking. Some things you can do (strategies) include:  · Quitting smoking totally, instead of slowly cutting back how much you smoke over a period of time.  · Going to in-person counseling. You are more likely to quit if you go to many counseling sessions.  · Using resources and support systems, such as:  ? Online chats with a counselor.  ? Phone quitlines.  ? Printed self-help materials.  ? Support groups or group counseling.  ? Text messaging programs.  ? Mobile phone apps or applications.  · Taking medicines. Some of these medicines may have nicotine in them. If you are pregnant or breastfeeding, do not take any medicines to quit smoking unless your doctor says it is okay. Talk with your doctor about counseling or other things that can help you.  Talk with your doctor about using more than one strategy at the same time, such as taking medicines while you are also going to in-person counseling. This can help make  quitting easier.  What things can I do to make it easier to quit?  Quitting smoking might feel very hard at first, but there is a lot that you can do to make it easier. Take these steps:  · Talk to your family and friends. Ask them to support and encourage you.  · Call phone quitlines, reach out to support groups, or work with a counselor.  · Ask people who smoke to not smoke around you.  · Avoid places that make you want (trigger) to smoke, such as:  ? Bars.  ? Parties.  ? Smoke-break areas at work.  · Spend time with people who do not smoke.  · Lower the stress in your life. Stress can make you want to smoke. Try these things to help your stress:  ? Getting regular exercise.  ? Deep-breathing exercises.  ? Yoga.  ? Meditating.  ? Doing a body scan. To do this, close your eyes, focus on one area of your body at a time from head to toe, and notice which parts of your body are tense. Try to relax the muscles in those areas.  · Download or buy apps on your mobile phone or tablet that can help you stick to your quit plan. There are many free apps, such as QuitGuide from the CDC (Centers for Disease Control and Prevention). You can find more   support from smokefree.gov and other websites.  This information is not intended to replace advice given to you by your health care provider. Make sure you discuss any questions you have with your health care provider.  Document Released: 11/23/2008 Document Revised: 09/25/2015 Document Reviewed: 06/13/2014  Elsevier Interactive Patient Education © 2019 Elsevier Inc.

## 2018-03-16 DIAGNOSIS — M1610 Unilateral primary osteoarthritis, unspecified hip: Secondary | ICD-10-CM | POA: Diagnosis not present

## 2018-03-16 DIAGNOSIS — Z791 Long term (current) use of non-steroidal anti-inflammatories (NSAID): Secondary | ICD-10-CM | POA: Diagnosis not present

## 2018-03-16 DIAGNOSIS — G894 Chronic pain syndrome: Secondary | ICD-10-CM | POA: Diagnosis not present

## 2018-03-16 DIAGNOSIS — Z978 Presence of other specified devices: Secondary | ICD-10-CM | POA: Diagnosis not present

## 2018-04-01 ENCOUNTER — Telehealth: Payer: Self-pay | Admitting: Physician Assistant

## 2018-04-01 NOTE — Telephone Encounter (Signed)
I left a message asking the pt to call and schedule AWV w/ [facility]. VDM (DD)

## 2018-05-12 DIAGNOSIS — G894 Chronic pain syndrome: Secondary | ICD-10-CM | POA: Diagnosis not present

## 2018-05-12 DIAGNOSIS — Z5181 Encounter for therapeutic drug level monitoring: Secondary | ICD-10-CM | POA: Diagnosis not present

## 2018-05-12 DIAGNOSIS — M1610 Unilateral primary osteoarthritis, unspecified hip: Secondary | ICD-10-CM | POA: Diagnosis not present

## 2018-05-12 DIAGNOSIS — Z79899 Other long term (current) drug therapy: Secondary | ICD-10-CM | POA: Diagnosis not present

## 2018-05-12 DIAGNOSIS — Z978 Presence of other specified devices: Secondary | ICD-10-CM | POA: Diagnosis not present

## 2018-05-12 DIAGNOSIS — Z791 Long term (current) use of non-steroidal anti-inflammatories (NSAID): Secondary | ICD-10-CM | POA: Diagnosis not present

## 2018-07-07 DIAGNOSIS — Z978 Presence of other specified devices: Secondary | ICD-10-CM | POA: Diagnosis not present

## 2018-07-07 DIAGNOSIS — M1610 Unilateral primary osteoarthritis, unspecified hip: Secondary | ICD-10-CM | POA: Diagnosis not present

## 2018-07-07 DIAGNOSIS — Z791 Long term (current) use of non-steroidal anti-inflammatories (NSAID): Secondary | ICD-10-CM | POA: Diagnosis not present

## 2018-07-07 DIAGNOSIS — G894 Chronic pain syndrome: Secondary | ICD-10-CM | POA: Diagnosis not present

## 2018-07-08 ENCOUNTER — Other Ambulatory Visit: Payer: Self-pay

## 2018-07-08 ENCOUNTER — Ambulatory Visit
Admission: RE | Admit: 2018-07-08 | Discharge: 2018-07-08 | Disposition: A | Discharge status: Home / Self Care | Payer: BLUE CROSS/BLUE SHIELD | Source: Ambulatory Visit | Attending: Physician Assistant | Admitting: Physician Assistant

## 2018-07-08 DIAGNOSIS — Z1239 Encounter for other screening for malignant neoplasm of breast: Secondary | ICD-10-CM | POA: Diagnosis present

## 2018-07-08 DIAGNOSIS — Z1231 Encounter for screening mammogram for malignant neoplasm of breast: Secondary | ICD-10-CM | POA: Insufficient documentation

## 2018-07-09 ENCOUNTER — Telehealth: Payer: Self-pay

## 2018-07-09 NOTE — Telephone Encounter (Signed)
LMTCB

## 2018-07-09 NOTE — Telephone Encounter (Signed)
Advised patient of results.  

## 2018-07-09 NOTE — Telephone Encounter (Signed)
-----   Message from Margaretann Loveless, [place] sent at 07/08/2018  2:58 PM EDT ----- Normal mammogram. Repeat screening in one year.

## 2018-09-01 DIAGNOSIS — G894 Chronic pain syndrome: Secondary | ICD-10-CM | POA: Diagnosis not present

## 2018-09-01 DIAGNOSIS — Z978 Presence of other specified devices: Secondary | ICD-10-CM | POA: Diagnosis not present

## 2018-09-01 DIAGNOSIS — M1610 Unilateral primary osteoarthritis, unspecified hip: Secondary | ICD-10-CM | POA: Diagnosis not present

## 2018-09-01 DIAGNOSIS — Z791 Long term (current) use of non-steroidal anti-inflammatories (NSAID): Secondary | ICD-10-CM | POA: Diagnosis not present

## 2018-10-21 DIAGNOSIS — G894 Chronic pain syndrome: Secondary | ICD-10-CM | POA: Diagnosis not present

## 2018-10-21 DIAGNOSIS — Z978 Presence of other specified devices: Secondary | ICD-10-CM | POA: Diagnosis not present

## 2018-10-21 DIAGNOSIS — M1610 Unilateral primary osteoarthritis, unspecified hip: Secondary | ICD-10-CM | POA: Diagnosis not present

## 2018-10-21 DIAGNOSIS — Z791 Long term (current) use of non-steroidal anti-inflammatories (NSAID): Secondary | ICD-10-CM | POA: Diagnosis not present

## 2018-10-25 ENCOUNTER — Other Ambulatory Visit: Payer: Self-pay | Admitting: Adult Health Nurse Practitioner

## 2018-10-25 DIAGNOSIS — M1610 Unilateral primary osteoarthritis, unspecified hip: Secondary | ICD-10-CM

## 2018-10-28 ENCOUNTER — Other Ambulatory Visit: Payer: Self-pay

## 2018-10-28 ENCOUNTER — Ambulatory Visit
Admission: RE | Admit: 2018-10-28 | Discharge: 2018-10-28 | Disposition: A | Discharge status: Home / Self Care | Payer: BC Managed Care – PPO | Source: Ambulatory Visit | Attending: Adult Health Nurse Practitioner | Admitting: Adult Health Nurse Practitioner

## 2018-10-28 DIAGNOSIS — Z96641 Presence of right artificial hip joint: Secondary | ICD-10-CM | POA: Diagnosis not present

## 2018-10-28 DIAGNOSIS — M1610 Unilateral primary osteoarthritis, unspecified hip: Secondary | ICD-10-CM | POA: Insufficient documentation

## 2018-10-28 DIAGNOSIS — M25551 Pain in right hip: Secondary | ICD-10-CM | POA: Diagnosis not present

## 2018-10-28 DIAGNOSIS — Z471 Aftercare following joint replacement surgery: Secondary | ICD-10-CM | POA: Diagnosis not present

## 2018-11-09 ENCOUNTER — Other Ambulatory Visit: Payer: Self-pay | Admitting: Physician Assistant

## 2018-11-09 DIAGNOSIS — E039 Hypothyroidism, unspecified: Secondary | ICD-10-CM

## 2018-11-21 DIAGNOSIS — Z01818 Encounter for other preprocedural examination: Secondary | ICD-10-CM | POA: Diagnosis not present

## 2018-11-24 DIAGNOSIS — M25451 Effusion, right hip: Secondary | ICD-10-CM | POA: Diagnosis not present

## 2018-11-24 DIAGNOSIS — M1610 Unilateral primary osteoarthritis, unspecified hip: Secondary | ICD-10-CM | POA: Diagnosis not present

## 2018-12-15 DIAGNOSIS — G894 Chronic pain syndrome: Secondary | ICD-10-CM | POA: Diagnosis not present

## 2018-12-15 DIAGNOSIS — M1610 Unilateral primary osteoarthritis, unspecified hip: Secondary | ICD-10-CM | POA: Diagnosis not present

## 2018-12-15 DIAGNOSIS — Z978 Presence of other specified devices: Secondary | ICD-10-CM | POA: Diagnosis not present

## 2018-12-15 DIAGNOSIS — Z5181 Encounter for therapeutic drug level monitoring: Secondary | ICD-10-CM | POA: Diagnosis not present

## 2018-12-15 DIAGNOSIS — Z79899 Other long term (current) drug therapy: Secondary | ICD-10-CM | POA: Diagnosis not present

## 2018-12-30 DIAGNOSIS — M25551 Pain in right hip: Secondary | ICD-10-CM | POA: Diagnosis not present

## 2018-12-30 DIAGNOSIS — Z471 Aftercare following joint replacement surgery: Secondary | ICD-10-CM | POA: Diagnosis not present

## 2018-12-30 DIAGNOSIS — Z96643 Presence of artificial hip joint, bilateral: Secondary | ICD-10-CM | POA: Diagnosis not present

## 2018-12-30 DIAGNOSIS — T8484XA Pain due to internal orthopedic prosthetic devices, implants and grafts, initial encounter: Secondary | ICD-10-CM | POA: Diagnosis not present

## 2018-12-31 ENCOUNTER — Other Ambulatory Visit: Payer: Self-pay | Admitting: Orthopedic Surgery

## 2018-12-31 DIAGNOSIS — Z96643 Presence of artificial hip joint, bilateral: Secondary | ICD-10-CM

## 2019-01-11 DIAGNOSIS — Z96643 Presence of artificial hip joint, bilateral: Secondary | ICD-10-CM | POA: Diagnosis not present

## 2019-01-21 ENCOUNTER — Encounter
Admission: RE | Admit: 2019-01-21 | Discharge: 2019-01-21 | Disposition: A | Discharge status: Home / Self Care | Payer: BC Managed Care – PPO | Source: Ambulatory Visit | Attending: Orthopedic Surgery | Admitting: Orthopedic Surgery

## 2019-01-21 ENCOUNTER — Other Ambulatory Visit: Payer: Self-pay

## 2019-01-21 DIAGNOSIS — Z96643 Presence of artificial hip joint, bilateral: Secondary | ICD-10-CM | POA: Insufficient documentation

## 2019-01-21 DIAGNOSIS — Z96641 Presence of right artificial hip joint: Secondary | ICD-10-CM | POA: Diagnosis not present

## 2019-01-21 DIAGNOSIS — Z471 Aftercare following joint replacement surgery: Secondary | ICD-10-CM | POA: Diagnosis not present

## 2019-01-21 MED ORDER — TECHNETIUM TC 99M MEDRONATE IV KIT
21.6800 | PACK | Freq: Once | INTRAVENOUS | Status: AC | PRN
  Administered 2019-01-21: 21.68 via INTRAVENOUS

## 2019-01-26 DIAGNOSIS — Z79899 Other long term (current) drug therapy: Secondary | ICD-10-CM | POA: Diagnosis not present

## 2019-01-26 DIAGNOSIS — Z978 Presence of other specified devices: Secondary | ICD-10-CM | POA: Diagnosis not present

## 2019-01-26 DIAGNOSIS — M1610 Unilateral primary osteoarthritis, unspecified hip: Secondary | ICD-10-CM | POA: Diagnosis not present

## 2019-01-26 DIAGNOSIS — G894 Chronic pain syndrome: Secondary | ICD-10-CM | POA: Diagnosis not present

## 2019-01-28 DIAGNOSIS — T8484XA Pain due to internal orthopedic prosthetic devices, implants and grafts, initial encounter: Secondary | ICD-10-CM | POA: Diagnosis not present

## 2019-01-28 DIAGNOSIS — Z96641 Presence of right artificial hip joint: Secondary | ICD-10-CM | POA: Diagnosis not present

## 2019-02-01 DIAGNOSIS — Z96641 Presence of right artificial hip joint: Secondary | ICD-10-CM | POA: Diagnosis not present

## 2019-02-28 ENCOUNTER — Other Ambulatory Visit: Payer: Self-pay | Admitting: Physician Assistant

## 2019-02-28 DIAGNOSIS — I10 Essential (primary) hypertension: Secondary | ICD-10-CM

## 2019-03-01 NOTE — Telephone Encounter (Signed)
Requested medication (s) are due for refill today: yes  Requested medication (s) are on the active medication list:yes  Last refill: 06/05/2018  Future visit scheduled: no  Notes to clinic:  no valid encounter in last 6 months    Requested Prescriptions  Pending Prescriptions Disp Refills   triamterene-hydrochlorothiazide (DYAZIDE) 37.5-25 MG capsule [Pharmacy Med Name: Triamterene-HCTZ 37.5-25 MG Oral Capsule] 90 capsule 0    Sig: Take 1 capsule by mouth once daily      Cardiovascular: Diuretic Combos Failed - 02/28/2019  7:20 PM      Failed - K in normal range and within 360 days    Potassium  Date Value Ref Range Status  02/12/2018 4.1 3.5 - 5.1 mmol/L Final          Failed - Na in normal range and within 360 days    Sodium  Date Value Ref Range Status  02/12/2018 139 135 - 145 mmol/L Final  10/06/2017 138 134 - 144 mmol/L Final          Failed - Cr in normal range and within 360 days    Creatinine, Ser  Date Value Ref Range Status  02/12/2018 1.26 (H) 0.44 - 1.00 mg/dL Final          Failed - Ca in normal range and within 360 days    Calcium  Date Value Ref Range Status  02/12/2018 8.8 (L) 8.9 - 10.3 mg/dL Final          Failed - Valid encounter within last 6 months    Recent Outpatient Visits           12 months ago Acute respiratory failure with hypoxia [facility])   [facility] [place], Alessandra Bevels, [place]   1 year ago Transition of care performed with sharing of clinical summary   [facility] [place], Alessandra Bevels, [place]   1 year ago Annual physical exam   [facility] [place], Alessandra Bevels, [place]   2 years ago Essential (primary) hypertension   [facility] [place], [place], [place]   2 years ago Acquired hypothyroidism   [facility] [place], [place]              Passed - Last BP in normal range    BP Readings from Last 1 Encounters:  03/03/18 139/87

## 2019-03-10 NOTE — Progress Notes (Signed)
[name] Female    DOB: 1965-02-12   54 y.o.   MRN: [id number] Visit Date: 03/14/2019  Today's Provider: Mar Daring, PA-C   Chief Complaint  Patient presents with  . Hypertension  . Medication Refill   Subjective:     HPI   Hypertension, follow-up:  BP Readings from Last 3 Encounters:  03/11/19 (!) 141/85  03/03/18 139/87  02/17/18 123/87   Patient stable on triamterene-hydrochlorothiazide.  She reports excellent compliance with treatment. She is not having side effects.  She is exercising. She is not adherent to low salt diet.   Outside blood pressures are 137/72. She is experiencing none.  Patient denies chest pain, chest pressure/discomfort, dyspnea, fatigue, lower extremity edema and syncope.   Cardiovascular risk factors include hypertension.  Use of agents associated with hypertension: none.     Weight trend: increasing steadily Wt Readings from Last 3 Encounters:  03/11/19 157 lb 3.2 oz (71.3 kg)  03/03/18 155 lb (70.3 kg)  02/17/18 140 lb (63.5 kg)    Current diet: well balanced  ------------------------------------------------------------------------   Hypothyroid, follow-up:  TSH  Date Value Ref Range Status  03/11/2019 17.300 (H) 0.450 - 4.500 uIU/mL Final  10/06/2017 1.990 0.450 - 4.500 uIU/mL Final  09/03/2016 1.940 0.450 - 4.500 uIU/mL Final   Wt Readings from Last 3 Encounters:  03/11/19 157 lb 3.2 oz (71.3 kg)  03/03/18 155 lb (70.3 kg)  02/17/18 140 lb (63.5 kg)   Patient on Levothyroxine to 174mcg.  She reports fair compliance with treatment. She is not having side effects.  She is exercising. She is experiencing heat / cold intolerance and weight changes She denies change in energy level, diarrhea, nervousness and palpitations Weight trend: increasing steadily  ------------------------------------------------------------------------   Chronic obstructive pulmonary disease: On Dulera  Allergies   Allergen Reactions  . Morphine Itching and Nausea And Vomiting  . Penicillins Rash     Current Outpatient Medications:  .  albuterol (PROVENTIL HFA;VENTOLIN HFA) 108 (90 Base) MCG/ACT inhaler, Inhale 2 puffs into the lungs every 6 (six) hours as needed for wheezing or shortness of breath., Disp: 1 Inhaler, Rfl: 2 .  Cholecalciferol 125 MCG (5000 UT) TABS, Take 1 tablet by mouth daily., Disp: , Rfl:  .  HYDROcodone-acetaminophen (NORCO/VICODIN) 5-325 MG tablet, Take by mouth. 1 tablet 3 times a day as needed for pain, Disp: , Rfl:  .  ibuprofen (ADVIL,MOTRIN) 200 MG tablet, Take 200 mg by mouth every 6 (six) hours as needed., Disp: , Rfl:  .  mometasone-formoterol (DULERA) 100-5 MCG/ACT AERO, Inhale 2 puffs into the lungs 2 (two) times daily., Disp: 1 Inhaler, Rfl: 2 .  triamterene-hydrochlorothiazide (DYAZIDE) 37.5-25 MG capsule, Take 1 capsule by mouth once daily, Disp: 90 capsule, Rfl: 0 .  buPROPion (WELLBUTRIN SR) 150 MG 12 hr tablet, Take 1 tablet (150 mg total) by mouth 2 (two) times daily. (Patient not taking: Reported on 03/11/2019), Disp: 180 tablet, Rfl: 1 .  levothyroxine (SYNTHROID) 100 MCG tablet, TAKE 1 TABLET BY MOUTH ONCE DAILY *NEEDS APPOINTMENT FOR REFILLS*, Disp: 90 tablet, Rfl: 1 .  potassium chloride SA (K-DUR,KLOR-CON) 20 MEQ tablet, Take 1 tablet (20 mEq total) by mouth daily. (Patient not taking: Reported on 03/11/2019), Disp: 30 tablet, Rfl: 3  Review of Systems  Constitutional: Negative.   Eyes: Negative for visual disturbance.  Respiratory: Negative.   Cardiovascular: Negative.   Gastrointestinal: Negative.   Neurological: Negative.   Psychiatric/Behavioral: Negative.  Social History   Tobacco Use  . Smoking status: Current Some Day Smoker    Packs/day: 0.25  . Smokeless tobacco: Never Used  Substance Use Topics  . Alcohol use: No      Objective:   BP (!) 141/85 (BP Location: Left Arm, Patient Position: Sitting, Cuff Size: Normal)   Pulse 86    Temp (!) 96.9 F (36.1 C) (Temporal)   Wt 157 lb 3.2 oz (71.3 kg)   BMI 26.98 kg/m  Vitals:   03/11/19 0902  BP: (!) 141/85  Pulse: 86  Temp: (!) 96.9 F (36.1 C)  TempSrc: Temporal  Weight: 157 lb 3.2 oz (71.3 kg)  Body mass index is 26.98 kg/m.   Physical Exam Vitals reviewed.  Constitutional:      General: She is not in acute distress.    Appearance: Normal appearance. She is well-developed. She is not ill-appearing or diaphoretic.  Neck:     Thyroid: No thyromegaly.     Vascular: No carotid bruit or JVD.     Trachea: No tracheal deviation.  Cardiovascular:     Rate and Rhythm: Normal rate and regular rhythm.     Pulses: Normal pulses.     Heart sounds: Normal heart sounds. No murmur. No friction rub. No gallop.   Pulmonary:     Effort: Pulmonary effort is normal. No respiratory distress.     Breath sounds: Normal breath sounds. No wheezing or rales.  Musculoskeletal:     Cervical back: Normal range of motion and neck supple.     Right lower leg: No edema.     Left lower leg: No edema.  Lymphadenopathy:     Cervical: No cervical adenopathy.  Neurological:     Mental Status: She is alert.      Results for orders placed or performed in visit on 03/11/19  CBC w/Diff/Platelet  Result Value Ref Range   WBC 8.4 3.4 - 10.8 x10E3/uL   RBC 4.93 3.77 - 5.28 x10E6/uL   Hemoglobin 15.2 11.1 - 15.9 g/dL   Hematocrit 62.6 94.8 - 46.6 %   MCV 91 79 - 97 fL   MCH 30.8 26.6 - 33.0 pg   MCHC 33.9 31.5 - 35.7 g/dL   RDW 54.6 27.0 - 35.0 %   Platelets 197 150 - 450 x10E3/uL   Neutrophils 64 Not Estab. %   Lymphs 26 Not Estab. %   Monocytes 6 Not Estab. %   Eos 3 Not Estab. %   Basos 1 Not Estab. %   Neutrophils Absolute 5.4 1.4 - 7.0 x10E3/uL   Lymphocytes Absolute 2.2 0.7 - 3.1 x10E3/uL   Monocytes Absolute 0.5 0.1 - 0.9 x10E3/uL   EOS (ABSOLUTE) 0.2 0.0 - 0.4 x10E3/uL   Basophils Absolute 0.1 0.0 - 0.2 x10E3/uL   Immature Granulocytes 0 Not Estab. %   Immature  Grans (Abs) 0.0 0.0 - 0.1 x10E3/uL  Comprehensive Metabolic Panel (CMET)  Result Value Ref Range   Glucose 82 65 - 99 mg/dL   BUN 17 6 - 24 mg/dL   Creatinine, Ser 0.93 (H) 0.57 - 1.00 mg/dL   GFR calc non Af Amer 44 (L) >59 mL/min/1.73   GFR calc Af Amer 51 (L) >59 mL/min/1.73   BUN/Creatinine Ratio 13 9 - 23   Sodium 139 134 - 144 mmol/L   Potassium 4.0 3.5 - 5.2 mmol/L   Chloride 101 96 - 106 mmol/L   CO2 23 20 - 29 mmol/L   Calcium 9.8 8.7 - 10.2  mg/dL   Total Protein 6.8 6.0 - 8.5 g/dL   Albumin 4.6 3.8 - 4.9 g/dL   Globulin, Total 2.2 1.5 - 4.5 g/dL   Albumin/Globulin Ratio 2.1 1.2 - 2.2   Bilirubin Total 0.3 0.0 - 1.2 mg/dL   Alkaline Phosphatase 116 39 - 117 IU/L   AST 22 0 - 40 IU/L   ALT 47 (H) 0 - 32 IU/L  Thyroid Panel With TSH  Result Value Ref Range   TSH 17.300 (H) 0.450 - 4.500 uIU/mL   T4, Total 7.0 4.5 - 12.0 ug/dL   T3 Uptake Ratio 22 (L) 24 - 39 %   Free Thyroxine Index 1.5 1.2 - 4.9  POCT urinalysis dipstick  Result Value Ref Range   Color, UA Yellow    Clarity, UA Clear    Glucose, UA Negative Negative   Bilirubin, UA Negtive    Ketones, UA Negative    Spec Grav, UA 1.010 1.010 - 1.025   Blood, UA Trace    pH, UA 7.5 5.0 - 8.0   Protein, UA Negative Negative   Urobilinogen, UA 0.2 0.2 or 1.0 E.U./dL   Nitrite, UA Negative    Leukocytes, UA Negative Negative   Appearance     Odor Negative        Assessment & Plan    1. Essential (primary) hypertension UA unremarkable. BP borderline elevated today. Patient has been out of Triamterene-HCTZ. Will check labs as below and f/u pending results. Medication refilled.  - CBC w/Diff/Platelet - Comprehensive Metabolic Panel (CMET) - Thyroid Panel With TSH  2. Chronic obstructive pulmonary disease, unspecified COPD type (HCC) Stable on Dulera. Followed by Pulmonology. Will check labs as below and f/u pending results. - CBC w/Diff/Platelet - Comprehensive Metabolic Panel (CMET) - Thyroid Panel With  TSH  3. Acquired hypothyroidism Has been off levothyroxine. Will check labs as below and f/u pending results. - CBC w/Diff/Platelet - Comprehensive Metabolic Panel (CMET) - Thyroid Panel With TSH  4. Psychophysiological insomnia Patient reports only sleeping 3-4 hours at most. Declines any sleep medication.  - CBC w/Diff/Platelet - Comprehensive Metabolic Panel (CMET) - Thyroid Panel With TSH     Margaretann Loveless, PA-C  [facility]

## 2019-03-11 ENCOUNTER — Other Ambulatory Visit: Payer: Self-pay

## 2019-03-11 ENCOUNTER — Encounter: Payer: Self-pay | Admitting: Physician Assistant

## 2019-03-11 ENCOUNTER — Ambulatory Visit (INDEPENDENT_AMBULATORY_CARE_PROVIDER_SITE_OTHER): Payer: BC Managed Care – PPO | Admitting: Physician Assistant

## 2019-03-11 VITALS — BP 141/85 | HR 86 | Temp 96.9°F | Wt 157.2 lb

## 2019-03-11 DIAGNOSIS — E039 Hypothyroidism, unspecified: Secondary | ICD-10-CM

## 2019-03-11 DIAGNOSIS — R944 Abnormal results of kidney function studies: Secondary | ICD-10-CM | POA: Diagnosis not present

## 2019-03-11 DIAGNOSIS — F5104 Psychophysiologic insomnia: Secondary | ICD-10-CM | POA: Diagnosis not present

## 2019-03-11 DIAGNOSIS — R7401 Elevation of levels of liver transaminase levels: Secondary | ICD-10-CM | POA: Diagnosis not present

## 2019-03-11 DIAGNOSIS — I10 Essential (primary) hypertension: Secondary | ICD-10-CM | POA: Diagnosis not present

## 2019-03-11 DIAGNOSIS — J449 Chronic obstructive pulmonary disease, unspecified: Secondary | ICD-10-CM

## 2019-03-11 LAB — POCT URINALYSIS DIPSTICK
Bilirubin, UA: NEGATIVE
Glucose, UA: NEGATIVE
Ketones, UA: NEGATIVE
Leukocytes, UA: NEGATIVE
Nitrite, UA: NEGATIVE
Odor: NEGATIVE
Protein, UA: NEGATIVE
Spec Grav, UA: 1.01 (ref 1.010–1.025)
Urobilinogen, UA: 0.2 E.U./dL
pH, UA: 7.5 (ref 5.0–8.0)

## 2019-03-11 NOTE — Patient Instructions (Signed)

## 2019-03-12 LAB — COMPREHENSIVE METABOLIC PANEL
ALT: 47 IU/L — ABNORMAL HIGH (ref 0–32)
AST: 22 IU/L (ref 0–40)
Albumin/Globulin Ratio: 2.1 (ref 1.2–2.2)
Albumin: 4.6 g/dL (ref 3.8–4.9)
Alkaline Phosphatase: 116 IU/L (ref 39–117)
BUN/Creatinine Ratio: 13 (ref 9–23)
BUN: 17 mg/dL (ref 6–24)
Bilirubin Total: 0.3 mg/dL (ref 0.0–1.2)
CO2: 23 mmol/L (ref 20–29)
Calcium: 9.8 mg/dL (ref 8.7–10.2)
Chloride: 101 mmol/L (ref 96–106)
Creatinine, Ser: 1.35 mg/dL — ABNORMAL HIGH (ref 0.57–1.00)
GFR calc Af Amer: 51 mL/min/{1.73_m2} — ABNORMAL LOW (ref >59)
GFR calc non Af Amer: 44 mL/min/{1.73_m2} — ABNORMAL LOW (ref >59)
Globulin, Total: 2.2 g/dL (ref 1.5–4.5)
Glucose: 82 mg/dL (ref 65–99)
Potassium: 4 mmol/L (ref 3.5–5.2)
Sodium: 139 mmol/L (ref 134–144)
Total Protein: 6.8 g/dL (ref 6.0–8.5)

## 2019-03-12 LAB — CBC WITH DIFFERENTIAL/PLATELET
Basophils Absolute: 0.1 10*3/uL (ref 0.0–0.2)
Basos: 1 %
EOS (ABSOLUTE): 0.2 10*3/uL (ref 0.0–0.4)
Eos: 3 %
Hematocrit: 44.8 % (ref 34.0–46.6)
Hemoglobin: 15.2 g/dL (ref 11.1–15.9)
Immature Grans (Abs): 0 10*3/uL (ref 0.0–0.1)
Immature Granulocytes: 0 %
Lymphocytes Absolute: 2.2 10*3/uL (ref 0.7–3.1)
Lymphs: 26 %
MCH: 30.8 pg (ref 26.6–33.0)
MCHC: 33.9 g/dL (ref 31.5–35.7)
MCV: 91 fL (ref 79–97)
Monocytes Absolute: 0.5 10*3/uL (ref 0.1–0.9)
Monocytes: 6 %
Neutrophils Absolute: 5.4 10*3/uL (ref 1.4–7.0)
Neutrophils: 64 %
Platelets: 197 10*3/uL (ref 150–450)
RBC: 4.93 x10E6/uL (ref 3.77–5.28)
RDW: 13.5 % (ref 11.7–15.4)
WBC: 8.4 10*3/uL (ref 3.4–10.8)

## 2019-03-12 LAB — THYROID PANEL WITH TSH
Free Thyroxine Index: 1.5 (ref 1.2–4.9)
T3 Uptake Ratio: 22 % — ABNORMAL LOW (ref 24–39)
T4, Total: 7 ug/dL (ref 4.5–12.0)
TSH: 17.3 u[IU]/mL — ABNORMAL HIGH (ref 0.450–4.500)

## 2019-03-14 ENCOUNTER — Other Ambulatory Visit: Payer: Self-pay

## 2019-03-14 DIAGNOSIS — E039 Hypothyroidism, unspecified: Secondary | ICD-10-CM

## 2019-03-14 MED ORDER — LEVOTHYROXINE SODIUM 100 MCG PO TABS: ORAL_TABLET | ORAL | 1 refills | Status: DC

## 2019-03-14 MED ORDER — TRIAMTERENE-HCTZ 37.5-25 MG PO CAPS: 1.0000 | ORAL_CAPSULE | Freq: Every day | ORAL | 1 refills | Status: DC

## 2019-03-14 NOTE — Telephone Encounter (Signed)
Patient advised as directed below. Per patient she hasn't been taking her thyroid medicine and even when she was she reports that there was no change.She needs a new prescription to be send to [facility]. She will call back to have her labs recheck.

## 2019-03-14 NOTE — Telephone Encounter (Signed)
-----   Message from Margaretann Loveless, [place] sent at 03/14/2019  9:47 AM EST ----- Blood count is normal. Kidney function has declined just slightly compared to last year. Make sure to push fluids. One liver enzyme is borderline high as well. Limiting fatty foods, alcohol and tylenol based products can help. These can be rechecked in 3 months if desired. Sodium, potassium and calcium are normal. Thyroid is off. Are you taking the levothyroxine in the morning before all other medications and breakfast? If so may need to adjust. If not, need to restart medication and then we can recheck in 3 months with other labs.

## 2019-03-16 DIAGNOSIS — M8949 Other hypertrophic osteoarthropathy, multiple sites: Secondary | ICD-10-CM | POA: Diagnosis not present

## 2019-03-16 DIAGNOSIS — Z978 Presence of other specified devices: Secondary | ICD-10-CM | POA: Diagnosis not present

## 2019-03-16 DIAGNOSIS — G894 Chronic pain syndrome: Secondary | ICD-10-CM | POA: Diagnosis not present

## 2019-03-16 DIAGNOSIS — M2550 Pain in unspecified joint: Secondary | ICD-10-CM | POA: Diagnosis not present

## 2019-03-16 DIAGNOSIS — M1610 Unilateral primary osteoarthritis, unspecified hip: Secondary | ICD-10-CM | POA: Diagnosis not present

## 2019-05-04 DIAGNOSIS — G894 Chronic pain syndrome: Secondary | ICD-10-CM | POA: Diagnosis not present

## 2019-05-04 DIAGNOSIS — M8949 Other hypertrophic osteoarthropathy, multiple sites: Secondary | ICD-10-CM | POA: Diagnosis not present

## 2019-05-04 DIAGNOSIS — Z978 Presence of other specified devices: Secondary | ICD-10-CM | POA: Diagnosis not present

## 2019-05-04 DIAGNOSIS — M1610 Unilateral primary osteoarthritis, unspecified hip: Secondary | ICD-10-CM | POA: Diagnosis not present

## 2019-06-22 DIAGNOSIS — M1610 Unilateral primary osteoarthritis, unspecified hip: Secondary | ICD-10-CM | POA: Diagnosis not present

## 2019-06-22 DIAGNOSIS — M8949 Other hypertrophic osteoarthropathy, multiple sites: Secondary | ICD-10-CM | POA: Diagnosis not present

## 2019-06-22 DIAGNOSIS — Z79899 Other long term (current) drug therapy: Secondary | ICD-10-CM | POA: Diagnosis not present

## 2019-06-22 DIAGNOSIS — G894 Chronic pain syndrome: Secondary | ICD-10-CM | POA: Diagnosis not present

## 2019-06-22 DIAGNOSIS — Z978 Presence of other specified devices: Secondary | ICD-10-CM | POA: Diagnosis not present

## 2019-06-22 DIAGNOSIS — Z5181 Encounter for therapeutic drug level monitoring: Secondary | ICD-10-CM | POA: Diagnosis not present

## 2019-07-18 DIAGNOSIS — Z880 Allergy status to penicillin: Secondary | ICD-10-CM | POA: Diagnosis not present

## 2019-07-18 DIAGNOSIS — F419 Anxiety disorder, unspecified: Secondary | ICD-10-CM | POA: Diagnosis not present

## 2019-07-18 DIAGNOSIS — G43909 Migraine, unspecified, not intractable, without status migrainosus: Secondary | ICD-10-CM | POA: Diagnosis not present

## 2019-07-18 DIAGNOSIS — E039 Hypothyroidism, unspecified: Secondary | ICD-10-CM | POA: Diagnosis not present

## 2019-07-18 DIAGNOSIS — T85695A Other mechanical complication of other nervous system device, implant or graft, initial encounter: Secondary | ICD-10-CM | POA: Diagnosis not present

## 2019-07-18 DIAGNOSIS — Z885 Allergy status to narcotic agent status: Secondary | ICD-10-CM | POA: Diagnosis not present

## 2019-07-18 DIAGNOSIS — K219 Gastro-esophageal reflux disease without esophagitis: Secondary | ICD-10-CM | POA: Diagnosis not present

## 2019-07-18 DIAGNOSIS — Z8711 Personal history of peptic ulcer disease: Secondary | ICD-10-CM | POA: Diagnosis not present

## 2019-07-18 DIAGNOSIS — I10 Essential (primary) hypertension: Secondary | ICD-10-CM | POA: Diagnosis not present

## 2019-07-18 DIAGNOSIS — G894 Chronic pain syndrome: Secondary | ICD-10-CM | POA: Diagnosis not present

## 2019-07-18 DIAGNOSIS — Z451 Encounter for adjustment and management of infusion pump: Secondary | ICD-10-CM | POA: Diagnosis not present

## 2019-07-18 DIAGNOSIS — Z90711 Acquired absence of uterus with remaining cervical stump: Secondary | ICD-10-CM | POA: Diagnosis not present

## 2019-07-18 DIAGNOSIS — J449 Chronic obstructive pulmonary disease, unspecified: Secondary | ICD-10-CM | POA: Diagnosis not present

## 2019-07-18 DIAGNOSIS — F17200 Nicotine dependence, unspecified, uncomplicated: Secondary | ICD-10-CM | POA: Diagnosis not present

## 2019-07-18 DIAGNOSIS — F40240 Claustrophobia: Secondary | ICD-10-CM | POA: Diagnosis not present

## 2019-07-18 DIAGNOSIS — F329 Major depressive disorder, single episode, unspecified: Secondary | ICD-10-CM | POA: Diagnosis not present

## 2019-08-31 DIAGNOSIS — G894 Chronic pain syndrome: Secondary | ICD-10-CM | POA: Diagnosis not present

## 2019-08-31 DIAGNOSIS — M1610 Unilateral primary osteoarthritis, unspecified hip: Secondary | ICD-10-CM | POA: Diagnosis not present

## 2019-08-31 DIAGNOSIS — Z978 Presence of other specified devices: Secondary | ICD-10-CM | POA: Diagnosis not present

## 2019-08-31 DIAGNOSIS — M8949 Other hypertrophic osteoarthropathy, multiple sites: Secondary | ICD-10-CM | POA: Diagnosis not present

## 2019-09-16 ENCOUNTER — Telehealth: Payer: Self-pay | Admitting: Physician Assistant

## 2019-09-16 NOTE — Telephone Encounter (Signed)
Copied from CRM [id number]. Topic: Medicare AWV >> Sep 16, 2019  9:42 AM Claudette Laws R wrote: Reason for CRM:  Left message for patient to call back and schedule Medicare Annual Wellness Visit (AWV) either virtually or in office.  No hx of AWV  eligible as of 02/10/2009   Please schedule at anytime with [facility].

## 2019-10-19 DIAGNOSIS — M1610 Unilateral primary osteoarthritis, unspecified hip: Secondary | ICD-10-CM | POA: Diagnosis not present

## 2019-10-19 DIAGNOSIS — Z978 Presence of other specified devices: Secondary | ICD-10-CM | POA: Diagnosis not present

## 2019-10-19 DIAGNOSIS — G894 Chronic pain syndrome: Secondary | ICD-10-CM | POA: Diagnosis not present

## 2019-11-29 DIAGNOSIS — Z79899 Other long term (current) drug therapy: Secondary | ICD-10-CM | POA: Diagnosis not present

## 2019-11-29 DIAGNOSIS — G894 Chronic pain syndrome: Secondary | ICD-10-CM | POA: Diagnosis not present

## 2019-11-29 DIAGNOSIS — Z5181 Encounter for therapeutic drug level monitoring: Secondary | ICD-10-CM | POA: Diagnosis not present

## 2019-11-29 DIAGNOSIS — M1610 Unilateral primary osteoarthritis, unspecified hip: Secondary | ICD-10-CM | POA: Diagnosis not present

## 2019-11-29 DIAGNOSIS — Z978 Presence of other specified devices: Secondary | ICD-10-CM | POA: Diagnosis not present

## 2020-01-17 DIAGNOSIS — Z978 Presence of other specified devices: Secondary | ICD-10-CM | POA: Diagnosis not present

## 2020-01-17 DIAGNOSIS — G894 Chronic pain syndrome: Secondary | ICD-10-CM | POA: Diagnosis not present

## 2020-02-10 ENCOUNTER — Other Ambulatory Visit: Payer: Self-pay | Admitting: Physician Assistant

## 2020-02-10 DIAGNOSIS — E039 Hypothyroidism, unspecified: Secondary | ICD-10-CM

## 2020-02-29 DIAGNOSIS — G894 Chronic pain syndrome: Secondary | ICD-10-CM | POA: Diagnosis not present

## 2020-02-29 DIAGNOSIS — F1190 Opioid use, unspecified, uncomplicated: Secondary | ICD-10-CM | POA: Diagnosis not present

## 2020-02-29 DIAGNOSIS — M25561 Pain in right knee: Secondary | ICD-10-CM | POA: Diagnosis not present

## 2020-02-29 DIAGNOSIS — Z978 Presence of other specified devices: Secondary | ICD-10-CM | POA: Diagnosis not present

## 2020-02-29 DIAGNOSIS — M1610 Unilateral primary osteoarthritis, unspecified hip: Secondary | ICD-10-CM | POA: Diagnosis not present

## 2020-03-16 ENCOUNTER — Ambulatory Visit (INDEPENDENT_AMBULATORY_CARE_PROVIDER_SITE_OTHER): Payer: BC Managed Care – PPO | Admitting: Physician Assistant

## 2020-03-16 ENCOUNTER — Other Ambulatory Visit: Payer: Self-pay

## 2020-03-16 ENCOUNTER — Ambulatory Visit
Admission: RE | Admit: 2020-03-16 | Discharge: 2020-03-16 | Disposition: A | Discharge status: Home / Self Care | Payer: BC Managed Care – PPO | Source: Ambulatory Visit | Attending: Physician Assistant | Admitting: Physician Assistant

## 2020-03-16 ENCOUNTER — Encounter: Payer: Self-pay | Admitting: Physician Assistant

## 2020-03-16 VITALS — BP 133/76 | HR 103 | Temp 98.6°F | Wt 150.0 lb

## 2020-03-16 DIAGNOSIS — Z01818 Encounter for other preprocedural examination: Secondary | ICD-10-CM

## 2020-03-16 DIAGNOSIS — F17200 Nicotine dependence, unspecified, uncomplicated: Secondary | ICD-10-CM

## 2020-03-16 DIAGNOSIS — I10 Essential (primary) hypertension: Secondary | ICD-10-CM

## 2020-03-16 DIAGNOSIS — E039 Hypothyroidism, unspecified: Secondary | ICD-10-CM

## 2020-03-16 DIAGNOSIS — Z6825 Body mass index (BMI) 25.0-25.9, adult: Secondary | ICD-10-CM

## 2020-03-16 DIAGNOSIS — J449 Chronic obstructive pulmonary disease, unspecified: Secondary | ICD-10-CM | POA: Diagnosis not present

## 2020-03-16 DIAGNOSIS — E119 Type 2 diabetes mellitus without complications: Secondary | ICD-10-CM | POA: Diagnosis not present

## 2020-03-16 DIAGNOSIS — E7800 Pure hypercholesterolemia, unspecified: Secondary | ICD-10-CM

## 2020-03-16 LAB — POCT URINALYSIS DIPSTICK
Bilirubin, UA: NEGATIVE
Blood, UA: NEGATIVE
Glucose, UA: NEGATIVE
Ketones, UA: NEGATIVE
Leukocytes, UA: NEGATIVE
Nitrite, UA: NEGATIVE
Protein, UA: NEGATIVE
Spec Grav, UA: <=1.005 — AB (ref 1.010–1.025)
Urobilinogen, UA: 0.2 E.U./dL
pH, UA: 7.5 (ref 5.0–8.0)

## 2020-03-16 MED ORDER — ALBUTEROL SULFATE HFA 108 (90 BASE) MCG/ACT IN AERS: 2.0000 | INHALATION_SPRAY | Freq: Four times a day (QID) | RESPIRATORY_TRACT | 2 refills | Status: DC | PRN

## 2020-03-16 MED ORDER — ALBUTEROL SULFATE HFA 108 (90 BASE) MCG/ACT IN AERS
2.0000 | INHALATION_SPRAY | Freq: Four times a day (QID) | RESPIRATORY_TRACT | 2 refills | Status: DC | PRN
Start: 2020-03-16 — End: 2021-11-01

## 2020-03-16 MED ORDER — LEVOTHYROXINE SODIUM 100 MCG PO TABS
ORAL_TABLET | ORAL | 3 refills | Status: DC
Start: 2020-03-16 — End: 2020-03-20

## 2020-03-16 NOTE — Progress Notes (Signed)
Established patient visit   [name]   DOB: 11-05-1965   55 y.o. Female  MRN: [id number] Visit Date: 03/16/2020  Today's healthcare provider: Margaretann Loveless, PA-C   Chief Complaint  Patient presents with  . Pre-op Exam   Subjective    HPI   Tasha Nichols is a 55 yr old female that presents today for pre-op evaluation for total revision of the right hip. She is scheduled for surgery on 04/05/20. She reports no previous issues with surgeries, except for a prolonged awakening period after sedation.   Patient Active Problem List   Diagnosis Date Noted  . New onset type 2 diabetes mellitus (HCC) 03/23/2020  . Vitamin D deficiency 09/03/2016  . COPD (chronic obstructive pulmonary disease) (HCC) 04/03/2015  . Tobacco abuse counseling 03/15/2015  . Lymphadenopathy 01/29/2015  . Acquired hypothyroidism 10/17/2014  . Arthritis 10/17/2014  . Back pain, chronic 10/17/2014  . Clinical depression 10/17/2014  . Essential (primary) hypertension 10/17/2014  . Acid reflux 10/17/2014  . Hypercholesteremia 10/17/2014  . Cannot sleep 10/17/2014  . Headache, migraine 10/17/2014  . Acquired polycythemia 10/17/2014  . Compulsive tobacco user syndrome 10/17/2014  . Presence of polypropylene mesh in chest wall 06/01/2013  . Presence of other specified functional implants 06/01/2013  . Disorder of hip joint 07/29/2012  . Chronic pain associated with significant psychosocial dysfunction 05/06/2012   Past Medical History:  Diagnosis Date  . Arthritis   . Chronic pain   . GERD (gastroesophageal reflux disease)   . Hypertension   . Thyroid disease    Social History   Tobacco Use  . Smoking status: Current Some Day Smoker    Packs/day: 0.25  . Smokeless tobacco: Never Used  Substance Use Topics  . Alcohol use: No  . Drug use: No   Allergies  Allergen Reactions  . Morphine Itching and Nausea And Vomiting  . Penicillins Rash     Medications: Outpatient  Medications Prior to Visit  Medication Sig  . baclofen (LIORESAL) 0.05 MG/ML injection 1 mL (50 mcg total) by Intrathecal route once for 1 dose. In pain pump  . Cholecalciferol 125 MCG (5000 UT) TABS Take 5,000 Units by mouth daily.  . cloNIDine (CATAPRES) 0.1 MG tablet Take 1 tablet (0.1 mg total) by mouth 3 (three) times daily. In pain pump  . HYDROcodone-acetaminophen (NORCO/VICODIN) 5-325 MG tablet Take 1 tablet by mouth 3 (three) times daily as needed for severe pain. 1 tablet 3 times a day as needed for pain  . HYDROmorphone in sodium chloride 0.9 % 100 mL Inject into the vein continuous. In pain pump  . ibuprofen (ADVIL,MOTRIN) 200 MG tablet Take 200 mg by mouth every 6 (six) hours as needed for moderate pain.  Marland Kitchen triamterene-hydrochlorothiazide (DYAZIDE) 37.5-25 MG capsule Take 1 each (1 capsule total) by mouth daily.  . [DISCONTINUED] albuterol (PROVENTIL HFA;VENTOLIN HFA) 108 (90 Base) MCG/ACT inhaler Inhale 2 puffs into the lungs every 6 (six) hours as needed for wheezing or shortness of breath.  . [DISCONTINUED] EUTHYROX 100 MCG tablet TAKE 1 TABLET BY MOUTH ONCE DAILY . APPOINTMENT REQUIRED FOR FUTURE REFILLS  . [DISCONTINUED] mometasone-formoterol (DULERA) 100-5 MCG/ACT AERO Inhale 2 puffs into the lungs 2 (two) times daily.  . [DISCONTINUED] potassium chloride SA (K-DUR,KLOR-CON) 20 MEQ tablet Take 1 tablet (20 mEq total) by mouth daily.   No facility-administered medications prior to visit.    Review of Systems  Constitutional: Negative.   HENT: Negative.   Eyes: Negative.  Respiratory: Negative.   Cardiovascular: Negative.   Gastrointestinal: Negative.   Endocrine: Negative.   Genitourinary: Negative.   Musculoskeletal: Negative.   Skin: Negative.   Allergic/Immunologic: Negative.   Neurological: Negative.   Hematological: Negative.   Psychiatric/Behavioral: Negative.         Objective    BP 133/76 (BP Location: Left Arm, Patient Position: Sitting, Cuff Size:  Normal)   Pulse (!) 103   Temp 98.6 F (37 C) (Oral)   Wt 150 lb (68 kg)   BMI 25.75 kg/m    Physical Exam Vitals reviewed.  Constitutional:      General: She is not in acute distress.    Appearance: Normal appearance. She is well-developed, well-groomed, overweight and well-nourished. She is not ill-appearing or diaphoretic.  HENT:     Head: Normocephalic and atraumatic.     Right Ear: Tympanic membrane, ear canal and external ear normal.     Left Ear: Tympanic membrane, ear canal and external ear normal.     Nose: Nose normal.     Mouth/Throat:     Mouth: Oropharynx is clear and moist. Mucous membranes are moist.     Pharynx: Oropharynx is clear. No oropharyngeal exudate or posterior oropharyngeal erythema.  Eyes:     General: No scleral icterus.       Right eye: No discharge.        Left eye: No discharge.     Extraocular Movements: Extraocular movements intact and EOM normal.     Conjunctiva/sclera: Conjunctivae normal.     Pupils: Pupils are equal, round, and reactive to light.  Neck:     Thyroid: No thyromegaly.     Vascular: No carotid bruit or JVD.     Trachea: No tracheal deviation.  Cardiovascular:     Rate and Rhythm: Normal rate and regular rhythm.     Pulses: Normal pulses and intact distal pulses.     Heart sounds: Normal heart sounds. No murmur heard. No friction rub. No gallop.   Pulmonary:     Effort: Pulmonary effort is normal. No respiratory distress.     Breath sounds: Normal breath sounds. No wheezing or rales.  Chest:     Chest wall: No tenderness.  Abdominal:     General: Abdomen is flat. Bowel sounds are normal. There is no distension.     Palpations: Abdomen is soft. There is no mass.     Tenderness: There is no abdominal tenderness. There is no guarding or rebound.  Musculoskeletal:        General: No tenderness or edema. Normal range of motion.     Cervical back: Normal range of motion and neck supple. No tenderness.     Right lower leg: No  edema.     Left lower leg: No edema.  Lymphadenopathy:     Cervical: No cervical adenopathy.  Skin:    General: Skin is warm and dry.     Capillary Refill: Capillary refill takes less than 2 seconds.     Findings: No rash.  Neurological:     General: No focal deficit present.     Mental Status: She is alert and oriented to person, place, and time. Mental status is at baseline.  Psychiatric:        Mood and Affect: Mood and affect and mood normal.        Behavior: Behavior normal. Behavior is cooperative.        Thought Content: Thought content normal.  Judgment: Judgment normal.     Results for orders placed or performed in visit on 03/16/20  CBC w/Diff/Platelet  Result Value Ref Range   WBC 8.6 3.4 - 10.8 x10E3/uL   RBC 5.27 3.77 - 5.28 x10E6/uL   Hemoglobin 15.7 11.1 - 15.9 g/dL   Hematocrit 16.1 09.6 - 46.6 %   MCV 88 79 - 97 fL   MCH 29.8 26.6 - 33.0 pg   MCHC 34.0 31.5 - 35.7 g/dL   RDW 04.5 40.9 - 81.1 %   Platelets 232 150 - 450 x10E3/uL   Neutrophils 65 Not Estab. %   Lymphs 26 Not Estab. %   Monocytes 7 Not Estab. %   Eos 1 Not Estab. %   Basos 1 Not Estab. %   Neutrophils Absolute 5.6 1.4 - 7.0 x10E3/uL   Lymphocytes Absolute 2.2 0.7 - 3.1 x10E3/uL   Monocytes Absolute 0.6 0.1 - 0.9 x10E3/uL   EOS (ABSOLUTE) 0.1 0.0 - 0.4 x10E3/uL   Basophils Absolute 0.1 0.0 - 0.2 x10E3/uL   Immature Granulocytes 0 Not Estab. %   Immature Grans (Abs) 0.0 0.0 - 0.1 x10E3/uL  Comprehensive Metabolic Panel (CMET)  Result Value Ref Range   Glucose 179 (H) 65 - 99 mg/dL   BUN 19 6 - 24 mg/dL   Creatinine, Ser 9.14 (H) 0.57 - 1.00 mg/dL   GFR calc non Af Amer 51 (L) >59 mL/min/1.73   GFR calc Af Amer 59 (L) >59 mL/min/1.73   BUN/Creatinine Ratio 16 9 - 23   Sodium 138 134 - 144 mmol/L   Potassium 3.8 3.5 - 5.2 mmol/L   Chloride 97 96 - 106 mmol/L   CO2 26 20 - 29 mmol/L   Calcium 9.6 8.7 - 10.2 mg/dL   Total Protein 6.9 6.0 - 8.5 g/dL   Albumin 4.7 3.8 - 4.9 g/dL    Globulin, Total 2.2 1.5 - 4.5 g/dL   Albumin/Globulin Ratio 2.1 1.2 - 2.2   Bilirubin Total 0.3 0.0 - 1.2 mg/dL   Alkaline Phosphatase 116 44 - 121 IU/L   AST 16 0 - 40 IU/L   ALT 24 0 - 32 IU/L  HgB A1c  Result Value Ref Range   Hgb A1c MFr Bld 8.1 (H) 4.8 - 5.6 %   Est. average glucose Bld gHb Est-mCnc 186 mg/dL  INR/PT  Result Value Ref Range   INR 0.9 0.9 - 1.2   Prothrombin Time 9.8 9.1 - 12.0 sec  TSH  Result Value Ref Range   TSH 6.210 (H) 0.450 - 4.500 uIU/mL  POCT urinalysis dipstick  Result Value Ref Range   Color, UA     Clarity, UA     Glucose, UA Negative Negative   Bilirubin, UA Negative    Ketones, UA Negative    Spec Grav, UA <=1.005 (A) 1.010 - 1.025   Blood, UA Negative    pH, UA 7.5 5.0 - 8.0   Protein, UA Negative Negative   Urobilinogen, UA 0.2 0.2 or 1.0 E.U./dL   Nitrite, UA Negative    Leukocytes, UA Negative Negative   Appearance     Odor      Assessment & Plan     1. Pre-op evaluation EKG shows Sinus  Tachycardia rate of 106, Nonspecific ST depression (stable since 2020) and new Rightward P/QRS axis and rotation -possible pulmonary disease. Patient is a smoker. CXR is normal. Will check labs as below and f/u pending results. Patient is considered low risk for surgery using  the SORT calculator.  - EKG 12-Lead - POCT urinalysis dipstick - DG Chest 2 View; Future - CBC w/Diff/Platelet - Comprehensive Metabolic Panel (CMET) - HgB A1c - INR/PT  2. Current smoker CXR was normal. Will check labs as below and f/u pending results. - DG Chest 2 View; Future - CBC w/Diff/Platelet - Comprehensive Metabolic Panel (CMET) - HgB A1c - INR/PT  3. Essential (primary) hypertension Stable. On Maxzide 37.5-25mg .   4. Chronic obstructive pulmonary disease, unspecified COPD type (HCC) Stable. Discussed continuing albuterol PRN. Discussed smoking cessation.  - albuterol (VENTOLIN HFA) 108 (90 Base) MCG/ACT inhaler; Inhale 2 puffs into the lungs every 6  (six) hours as needed for wheezing or shortness of breath.  Dispense: 18 g; Refill: 2  5. Acquired hypothyroidism Stable. Continue Levothyroxine .  - TSH  6. Hypercholesteremia Stable. Diet controlled.   7. BMI 25.0-25.9,adult Counseled patient on healthy lifestyle modifications including dieting and exercise.    No follow-ups on file.      Delmer Islam, PA-C, have reviewed all documentation for this visit. The documentation on 03/26/20 for the exam, diagnosis, procedures, and orders are all accurate and complete.   Reine Just  [facility] [phone] (phone) [phone] (fax)  [facility]

## 2020-03-16 NOTE — Patient Instructions (Signed)
Total Hip Replacement Total hip replacement is a surgery to replace your damaged hip joint. You may have this surgery to lessen your pain and to help your hip move better. Tell your doctor about:  Any allergies you have.  All medicines you are taking. This includes vitamins, herbs, eye drops, creams, and over-the-counter medicines.  Any problems you or family members have had with anesthetic medicines. These medicines numb areas of your body or make you fall asleep for surgery.  Any blood disorders you have.  Any surgeries you have had.  Any medical conditions you have.  Whether you are pregnant or may be pregnant. What are the risks? In general, this is a safe surgery. But problems may occur, such as:  Infection.  Bleeding.  Allergic reactions to medicines.  Damage to nerves or other parts.  Problems with your man-made joint (prosthesis). These may include: ? Your joint moving out of place or getting loose. ? Swelling and pressure buildup.  A blood clot that can form in your leg and break loose and move to your lungs.  A bone break, loss of movement, or lasting stiffness or pain. What happens before the procedure? Staying hydrated Follow instructions from your doctor about hydration. These may include:  Up to 2 hours before the procedure - you may continue to drink clear liquids. These include water, clear fruit juice, black coffee, and plain tea.   Eating and drinking restrictions  Follow instructions from your doctor about eating and drinking. These may include: ? 8 hours before the procedure - stop eating heavy meals or foods. These include meat, fried foods, or fatty foods. ? 6 hours before the procedure - stop eating light meals or foods. These include toast or cereal. ? 6 hours before the procedure - stop drinking milk or drinks that contain milk. ? 2 hours before the procedure - stop drinking clear liquids.  Do not drink alcohol for 48 hours before your  surgery. Medicines  Ask your doctor about changing or stopping: ? Your normal medicines. ? Vitamins, herbs, and supplements. ? Over-the-counter medicines.  Do not take aspirin or ibuprofen unless you are told to. Tests  You may have a physical exam.  You may have tests, such as: ? X-rays or an MRI. ? An electrocardiogram (EKG). ? Blood or pee (urine) tests. General instructions  Plan to have a responsible adult take you home from the hospital or clinic.  Plan to have a responsible adult care for you for the time you are told after you leave the hospital or clinic. This is important.  Get your home ready so you can be safe while you heal. Be sure you can reach all the things you need.  Keep your body and teeth clean. Germs from anywhere in your body can infect your new joint. Tell your doctor: ? If you plan to have dental care and routine cleanings. ? If you get any skin infections.  Avoid shaving your legs just before surgery.  For your safety, your doctor may: ? Loraine Leriche the area of surgery. ? Remove hair at the surgery site. ? Ask you to wash with a soap that kills germs. ? Give you antibiotic medicine. What happens during the procedure?  An IV tube will be put into one of your veins.  You may be given: ? A sedative. This medicine helps you relax. ? A medicine to:  Numb certain areas of your body.  Make you fall asleep for surgery.  Your doctor  will make a cut (incision) in your hip.  Then, your doctor will: ? Cut and take out damaged parts of your hip joint. ? Put a man-made hip joint into place. ? Do an X-ray of the new hip joint to make sure it is in the right place. ? Place a drain to take away extra fluid, if needed. ? Close the incision and place a bandage over it. The procedure may vary among doctors and hospitals.   What happens after the procedure?  You will be monitored until you leave the hospital or clinic. This includes checking your blood  pressure, heart rate, breathing rate, and blood oxygen level.  Your health care team will check if you can move your foot and can feel sensations in it.  You will receive pain medicine.  You may need to: ? Wear a type of socks that are tight (compression stockings). ? Take medicines to thin your blood (anticoagulants).  You will do exercises to help your hip move better and get stronger until you are well enough to go home.  You may need to use a walker, crutches, or a cane.  You may need to use a wedge pillow (hip abduction pillow) when you are in bed. Summary  Total hip replacement is a surgery to replace your damaged hip joint.  Follow instructions from your doctor about eating and drinking before the procedure.  Plan to have a responsible adult take you home from the hospital or clinic.  You may need to wear a type of socks that are tight (compression stockings) after surgery. This information is not intended to replace advice given to you by your health care provider. Make sure you discuss any questions you have with your health care provider. Document Revised: 06/23/2019 Document Reviewed: 06/23/2019 Elsevier Patient Education  2021 [employer].

## 2020-03-17 LAB — CBC WITH DIFFERENTIAL/PLATELET
Basophils Absolute: 0.1 10*3/uL (ref 0.0–0.2)
Basos: 1 %
EOS (ABSOLUTE): 0.1 10*3/uL (ref 0.0–0.4)
Eos: 1 %
Hematocrit: 46.2 % (ref 34.0–46.6)
Hemoglobin: 15.7 g/dL (ref 11.1–15.9)
Immature Grans (Abs): 0 10*3/uL (ref 0.0–0.1)
Immature Granulocytes: 0 %
Lymphocytes Absolute: 2.2 10*3/uL (ref 0.7–3.1)
Lymphs: 26 %
MCH: 29.8 pg (ref 26.6–33.0)
MCHC: 34 g/dL (ref 31.5–35.7)
MCV: 88 fL (ref 79–97)
Monocytes Absolute: 0.6 10*3/uL (ref 0.1–0.9)
Monocytes: 7 %
Neutrophils Absolute: 5.6 10*3/uL (ref 1.4–7.0)
Neutrophils: 65 %
Platelets: 232 10*3/uL (ref 150–450)
RBC: 5.27 x10E6/uL (ref 3.77–5.28)
RDW: 12.6 % (ref 11.7–15.4)
WBC: 8.6 10*3/uL (ref 3.4–10.8)

## 2020-03-17 LAB — COMPREHENSIVE METABOLIC PANEL
ALT: 24 IU/L (ref 0–32)
AST: 16 IU/L (ref 0–40)
Albumin/Globulin Ratio: 2.1 (ref 1.2–2.2)
Albumin: 4.7 g/dL (ref 3.8–4.9)
Alkaline Phosphatase: 116 IU/L (ref 44–121)
BUN/Creatinine Ratio: 16 (ref 9–23)
BUN: 19 mg/dL (ref 6–24)
Bilirubin Total: 0.3 mg/dL (ref 0.0–1.2)
CO2: 26 mmol/L (ref 20–29)
Calcium: 9.6 mg/dL (ref 8.7–10.2)
Chloride: 97 mmol/L (ref 96–106)
Creatinine, Ser: 1.19 mg/dL — ABNORMAL HIGH (ref 0.57–1.00)
GFR calc Af Amer: 59 mL/min/{1.73_m2} — ABNORMAL LOW (ref >59)
GFR calc non Af Amer: 51 mL/min/{1.73_m2} — ABNORMAL LOW (ref >59)
Globulin, Total: 2.2 g/dL (ref 1.5–4.5)
Glucose: 179 mg/dL — ABNORMAL HIGH (ref 65–99)
Potassium: 3.8 mmol/L (ref 3.5–5.2)
Sodium: 138 mmol/L (ref 134–144)
Total Protein: 6.9 g/dL (ref 6.0–8.5)

## 2020-03-17 LAB — PROTIME-INR
INR: 0.9 (ref 0.9–1.2)
Prothrombin Time: 9.8 s (ref 9.1–12.0)

## 2020-03-17 LAB — TSH: TSH: 6.21 u[IU]/mL — ABNORMAL HIGH (ref 0.450–4.500)

## 2020-03-17 LAB — HEMOGLOBIN A1C
Est. average glucose Bld gHb Est-mCnc: 186 mg/dL
Hgb A1c MFr Bld: 8.1 % — ABNORMAL HIGH (ref 4.8–5.6)

## 2020-03-20 ENCOUNTER — Other Ambulatory Visit: Payer: Self-pay | Admitting: Physician Assistant

## 2020-03-20 MED ORDER — LEVOTHYROXINE SODIUM 112 MCG PO TABS: 112.0000 ug | ORAL_TABLET | Freq: Every day | ORAL | 3 refills | Status: DC

## 2020-03-23 ENCOUNTER — Ambulatory Visit (INDEPENDENT_AMBULATORY_CARE_PROVIDER_SITE_OTHER): Payer: BC Managed Care – PPO | Admitting: Physician Assistant

## 2020-03-23 ENCOUNTER — Encounter: Payer: Self-pay | Admitting: Physician Assistant

## 2020-03-23 ENCOUNTER — Other Ambulatory Visit: Payer: Self-pay

## 2020-03-23 VITALS — BP 110/72 | HR 91 | Temp 98.3°F | Ht 63.75 in | Wt 149.5 lb

## 2020-03-23 DIAGNOSIS — E119 Type 2 diabetes mellitus without complications: Secondary | ICD-10-CM

## 2020-03-23 DIAGNOSIS — E1165 Type 2 diabetes mellitus with hyperglycemia: Secondary | ICD-10-CM | POA: Insufficient documentation

## 2020-03-23 LAB — POCT GLYCOSYLATED HEMOGLOBIN (HGB A1C)
Est. average glucose Bld gHb Est-mCnc: 174
Hemoglobin A1C: 7.7 % — AB (ref 4.0–5.6)

## 2020-03-23 MED ORDER — METFORMIN HCL 500 MG PO TABS: 500.0000 mg | ORAL_TABLET | Freq: Every day | ORAL | 0 refills | Status: DC

## 2020-03-23 NOTE — Progress Notes (Signed)
Established patient visit   [name]   DOB: 1965-08-20   55 y.o. Female  MRN: [id number] Visit Date: 03/23/2020  Today's healthcare provider: Margaretann Loveless, PA-C   Chief Complaint  Patient presents with  . Diabetes   Subjective    HPI  Diabetes Mellitus: Patient presents with new onset of Type 2 diabetes. Symptoms: none. Symptoms have stabilized. Patient denies foot ulcerations, hyperglycemia, hypoglycemia , increase appetite, paresthesia of the feet, polydipsia, polyuria, visual disturbances, vomitting and weight loss.  Evaluation to date has been included: hemoglobin A1C.  Home sugars: patient does not check sugars. Treatment to date: none.   Does have strong family history of diabetes. Last A1c was 8.1. Had not been checked for 2 years. One prior had been 6.1. Patient does admit to drinking a lot of mountain dew. Reports she could drink a 2 L of Mountain dew in a day. She would rather drink mt dew than eat some days. Since the lab result she has been cutting back and trying to limit to one daily.   Patient Active Problem List   Diagnosis Date Noted  . New onset type 2 diabetes mellitus (HCC) 03/23/2020  . Vitamin D deficiency 09/03/2016  . COPD (chronic obstructive pulmonary disease) (HCC) 04/03/2015  . Tobacco abuse counseling 03/15/2015  . Lymphadenopathy 01/29/2015  . Acquired hypothyroidism 10/17/2014  . Arthritis 10/17/2014  . Back pain, chronic 10/17/2014  . Clinical depression 10/17/2014  . Essential (primary) hypertension 10/17/2014  . Acid reflux 10/17/2014  . Hypercholesteremia 10/17/2014  . Cannot sleep 10/17/2014  . Headache, migraine 10/17/2014  . Acquired polycythemia 10/17/2014  . Compulsive tobacco user syndrome 10/17/2014  . Presence of polypropylene mesh in chest wall 06/01/2013  . Presence of other specified functional implants 06/01/2013  . Disorder of hip joint 07/29/2012  . Chronic pain associated with significant  psychosocial dysfunction 05/06/2012   Social History   Tobacco Use  . Smoking status: Current Some Day Smoker    Packs/day: 0.25  . Smokeless tobacco: Never Used  Substance Use Topics  . Alcohol use: No  . Drug use: No   Allergies  Allergen Reactions  . Morphine Itching and Nausea And Vomiting  . Penicillins Rash       Medications: Outpatient Medications Prior to Visit  Medication Sig  . albuterol (VENTOLIN HFA) 108 (90 Base) MCG/ACT inhaler Inhale 2 puffs into the lungs every 6 (six) hours as needed for wheezing or shortness of breath.  . baclofen (LIORESAL) 0.05 MG/ML injection 1 mL (50 mcg total) by Intrathecal route once for 1 dose. In pain pump  . Cholecalciferol 125 MCG (5000 UT) TABS Take 5,000 Units by mouth daily.  . cloNIDine (CATAPRES) 0.1 MG tablet Take 1 tablet (0.1 mg total) by mouth 3 (three) times daily. In pain pump  . HYDROcodone-acetaminophen (NORCO/VICODIN) 5-325 MG tablet Take 1 tablet by mouth 3 (three) times daily as needed for severe pain. 1 tablet 3 times a day as needed for pain  . HYDROmorphone in sodium chloride 0.9 % 100 mL Inject into the vein continuous. In pain pump  . ibuprofen (ADVIL,MOTRIN) 200 MG tablet Take 200 mg by mouth every 6 (six) hours as needed for moderate pain.  Marland Kitchen levothyroxine (SYNTHROID) 112 MCG tablet Take 1 tablet (112 mcg total) by mouth daily.  Marland Kitchen triamterene-hydrochlorothiazide (DYAZIDE) 37.5-25 MG capsule Take 1 each (1 capsule total) by mouth daily.   No facility-administered medications prior to visit.    Review  of Systems  Constitutional: Negative.   Eyes: Negative.   Respiratory: Negative.   Cardiovascular: Negative.   Gastrointestinal: Negative.   Endocrine: Negative for polydipsia, polyphagia and polyuria.    Last CBC Lab Results  Component Value Date   WBC 8.6 03/16/2020   HGB 15.7 03/16/2020   HCT 46.2 03/16/2020   MCV 88 03/16/2020   MCH 29.8 03/16/2020   RDW 12.6 03/16/2020   PLT 232 03/16/2020    Last thyroid functions Lab Results  Component Value Date   TSH 6.210 (H) 03/16/2020   T4TOTAL 7.0 03/11/2019        Objective    BP 110/72 (BP Location: Left Arm, Patient Position: Sitting, Cuff Size: Normal)   Pulse 91   Temp 98.3 F (36.8 C) (Oral)   Ht 5' 3.75" (1.619 m)   Wt 149 lb 8 oz (67.8 kg)   BMI 25.86 kg/m  BP Readings from Last 3 Encounters:  03/23/20 110/72  03/16/20 133/76  03/11/19 (!) 141/85   Wt Readings from Last 3 Encounters:  03/23/20 149 lb 8 oz (67.8 kg)  03/16/20 150 lb (68 kg)  03/11/19 157 lb 3.2 oz (71.3 kg)      Physical Exam Vitals reviewed.  Constitutional:      General: She is not in acute distress.    Appearance: Normal appearance. She is well-developed and well-nourished. She is not ill-appearing.  HENT:     Head: Normocephalic and atraumatic.  Eyes:     Extraocular Movements: EOM normal.  Pulmonary:     Effort: Pulmonary effort is normal. No respiratory distress.  Musculoskeletal:     Cervical back: Normal range of motion and neck supple.  Neurological:     Mental Status: She is alert.  Psychiatric:        Mood and Affect: Mood and affect and mood normal.        Behavior: Behavior normal.        Thought Content: Thought content normal.        Judgment: Judgment normal.     Diabetic Foot Exam - Simple   Simple Foot Form Diabetic Foot exam was performed with the following findings: Yes 03/23/2020  1:13 PM  Visual Inspection No deformities, no ulcerations, no other skin breakdown bilaterally: Yes Sensation Testing Intact to touch and monofilament testing bilaterally: Yes Pulse Check Posterior Tibialis and Dorsalis pulse intact bilaterally: Yes Comments      Results for orders placed or performed in visit on 03/23/20  POCT HgB A1C  Result Value Ref Range   Hemoglobin A1C 7.7 (A) 4.0 - 5.6 %   Est. average glucose Bld gHb Est-mCnc 174     Assessment & Plan     1. New onset type 2 diabetes mellitus (HCC) A1c was  8.1 on labs and 7.7 on fingerstick today (patient wanted to verify lab). Since she has upcoming surgery we will start Metformin 500mg  daily as below. Once she has completed surgery, she can stop medication and work on lifestyle modifications for diabetes. She will return in 6 months, or sooner if needed, to have A1c rechecked and see if medications will be required long term or if she can control with diet and exercise.  - POCT HgB A1C - metFORMIN (GLUCOPHAGE) 500 MG tablet; Take 1 tablet (500 mg total) by mouth daily with breakfast.  Dispense: 30 tablet; Refill: 0   No follow-ups on file.      , PA-C, have reviewed all documentation for  this visit. The documentation on 03/23/20 for the exam, diagnosis, procedures, and orders are all accurate and complete.   Reine Just  [facility] [phone] (phone) [phone] (fax)  [facility]

## 2020-03-23 NOTE — Patient Instructions (Signed)
Diabetes Mellitus and Nutrition, Adult When you have diabetes, or diabetes mellitus, it is very important to have healthy eating habits because your blood sugar (glucose) levels are greatly affected by what you eat and drink. Eating healthy foods in the right amounts, at about the same times every day, can help you:  Control your blood glucose.  Lower your risk of heart disease.  Improve your blood pressure.  Reach or maintain a healthy weight. What can affect my meal plan? Every person with diabetes is different, and each person has different needs for a meal plan. Your health care provider may recommend that you work with a dietitian to make a meal plan that is best for you. Your meal plan may vary depending on factors such as:  The calories you need.  The medicines you take.  Your weight.  Your blood glucose, blood pressure, and cholesterol levels.  Your activity level.  Other health conditions you have, such as heart or kidney disease. How do carbohydrates affect me? Carbohydrates, also called carbs, affect your blood glucose level more than any other type of food. Eating carbs naturally raises the amount of glucose in your blood. Carb counting is a method for keeping track of how many carbs you eat. Counting carbs is important to keep your blood glucose at a healthy level, especially if you use insulin or take certain oral diabetes medicines. It is important to know how many carbs you can safely have in each meal. This is different for every person. Your dietitian can help you calculate how many carbs you should have at each meal and for each snack. How does alcohol affect me? Alcohol can cause a sudden decrease in blood glucose (hypoglycemia), especially if you use insulin or take certain oral diabetes medicines. Hypoglycemia can be a life-threatening condition. Symptoms of hypoglycemia, such as sleepiness, dizziness, and confusion, are similar to symptoms of having too much  alcohol.  Do not drink alcohol if: ? Your health care provider tells you not to drink. ? You are pregnant, may be pregnant, or are planning to become pregnant.  If you drink alcohol: ? Do not drink on an empty stomach. ? Limit how much you use to:  0-1 drink a day for women.  0-2 drinks a day for men. ? Be aware of how much alcohol is in your drink. In the [place] one drink equals one 12 oz bottle of beer (355 mL), one 5 oz glass of wine (148 mL), or one 1 oz glass of hard liquor (44 mL). ? Keep yourself hydrated with water, diet soda, or unsweetened iced tea.  Keep in mind that regular soda, juice, and other mixers may contain a lot of sugar and must be counted as carbs. What are tips for following this plan? Reading food labels  Start by checking the serving size on the "Nutrition Facts" label of packaged foods and drinks. The amount of calories, carbs, fats, and other nutrients listed on the label is based on one serving of the item. Many items contain more than one serving per package.  Check the total grams (g) of carbs in one serving. You can calculate the number of servings of carbs in one serving by dividing the total carbs by 15. For example, if a food has 30 g of total carbs per serving, it would be equal to 2 servings of carbs.  Check the number of grams (g) of saturated fats and trans fats in one serving. Choose foods that have   a low amount or none of these fats.  Check the number of milligrams (mg) of salt (sodium) in one serving. Most people should limit total sodium intake to less than 2,300 mg per day.  Always check the nutrition information of foods labeled as "low-fat" or "nonfat." These foods may be higher in added sugar or refined carbs and should be avoided.  Talk to your dietitian to identify your daily goals for nutrients listed on the label. Shopping  Avoid buying canned, pre-made, or processed foods. These foods tend to be high in fat, sodium, and added  sugar.  Shop around the outside edge of the grocery store. This is where you will most often find fresh fruits and vegetables, bulk grains, fresh meats, and fresh dairy. Cooking  Use low-heat cooking methods, such as baking, instead of high-heat cooking methods like deep frying.  Cook using healthy oils, such as olive, canola, or sunflower oil.  Avoid cooking with butter, cream, or high-fat meats. Meal planning  Eat meals and snacks regularly, preferably at the same times every day. Avoid going long periods of time without eating.  Eat foods that are high in fiber, such as fresh fruits, vegetables, beans, and whole grains. Talk with your dietitian about how many servings of carbs you can eat at each meal.  Eat 4-6 oz (112-168 g) of lean protein each day, such as lean meat, chicken, fish, eggs, or tofu. One ounce (oz) of lean protein is equal to: ? 1 oz (28 g) of meat, chicken, or fish. ? 1 egg. ?  cup (62 g) of tofu.  Eat some foods each day that contain healthy fats, such as avocado, nuts, seeds, and fish.   What foods should I eat? Fruits Berries. Apples. Oranges. Peaches. Apricots. Plums. Grapes. Mango. Papaya. Pomegranate. Kiwi. Cherries. Vegetables Lettuce. Spinach. Leafy greens, including kale, chard, collard greens, and mustard greens. Beets. Cauliflower. Cabbage. Broccoli. Carrots. Green beans. Tomatoes. Peppers. Onions. Cucumbers. Brussels sprouts. Grains Whole grains, such as whole-wheat or whole-grain bread, crackers, tortillas, cereal, and pasta. Unsweetened oatmeal. Quinoa. Brown or wild rice. Meats and other proteins Seafood. Poultry without skin. Lean cuts of poultry and beef. Tofu. Nuts. Seeds. Dairy Low-fat or fat-free dairy products such as milk, yogurt, and cheese. The items listed above may not be a complete list of foods and beverages you can eat. Contact a dietitian for more information. What foods should I avoid? Fruits Fruits canned with  syrup. Vegetables Canned vegetables. Frozen vegetables with butter or cream sauce. Grains Refined white flour and flour products such as bread, pasta, snack foods, and cereals. Avoid all processed foods. Meats and other proteins Fatty cuts of meat. Poultry with skin. Breaded or fried meats. Processed meat. Avoid saturated fats. Dairy Full-fat yogurt, cheese, or milk. Beverages Sweetened drinks, such as soda or iced tea. The items listed above may not be a complete list of foods and beverages you should avoid. Contact a dietitian for more information. Questions to ask a health care provider  Do I need to meet with a diabetes educator?  Do I need to meet with a dietitian?  What number can I call if I have questions?  When are the best times to check my blood glucose? Where to find more information:  American Diabetes Association: diabetes.org  Academy of Nutrition and Dietetics: www.eatright.org  National Institute of Diabetes and Digestive and Kidney Diseases: www.niddk.nih.gov  Association of Diabetes Care and Education Specialists: www.diabeteseducator.org Summary  It is important to have healthy eating   habits because your blood sugar (glucose) levels are greatly affected by what you eat and drink.  A healthy meal plan will help you control your blood glucose and maintain a healthy lifestyle.  Your health care provider may recommend that you work with a dietitian to make a meal plan that is best for you.  Keep in mind that carbohydrates (carbs) and alcohol have immediate effects on your blood glucose levels. It is important to count carbs and to use alcohol carefully. This information is not intended to replace advice given to you by your health care provider. Make sure you discuss any questions you have with your health care provider. Document Revised: 01/04/2019 Document Reviewed: 01/04/2019 Elsevier Patient Education  2021 [employer] Inc.  

## 2020-03-26 ENCOUNTER — Encounter: Payer: Self-pay | Admitting: Physician Assistant

## 2020-03-27 NOTE — H&P (Signed)
TOTAL HIP CONVERSION ADMISSION H&P  Patient is admitted for left total hip conversion of metal to ceramic hip arthroplasty with debridement  Subjective:  Chief Complaint:  Left hip pain and swelling due to metalosis from previous THA  HPI: Tasha Nichols, 55 y.o. 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 The indications for the conversion total hip arthroplasty are bearing surface wear leading to  symptomatic synovitis.  Onset of symptoms was gradual starting >10 years ago with gradually worsening course since that time.  Prior procedures on the right hip include arthroplasty.  Patient currently rates pain in the right hip at 10 out of 10 with activity.  There is night pain, worsening of pain with activity and weight bearing, trendelenberg gait, pain that interfers with activities of daily living and pain with passive range of motion. Patient has evidence of previous metal-on-metal hip replacement by imaging studies.  This condition presents safety issues increasing the risk of falls.   There is no current active infection.  Risks, benefits and expectations were discussed with the patient.  Risks including but not limited to the risk of anesthesia, blood clots, nerve damage, blood vessel damage, failure of the prosthesis, infection and up to and including death.  Patient understand the risks, benefits and expectations and wishes to proceed with surgery.   D/C Plans:       Home   Post-op Meds:       No Rx given   Tranexamic Acid:      To be given - IV   Decadron:      Is to be given  FYI:     ASA  Oxycodone (on Norco pre-op)  DME:   Pt already has equipment   PT:  [facility]  Pharmacy: [facility] - [address]  [place], [place]     Patient Active Problem List   Diagnosis Date Noted  . New onset type 2 diabetes mellitus (HCC) 03/23/2020  . Vitamin D deficiency 09/03/2016  . COPD (chronic obstructive pulmonary disease) (HCC) 04/03/2015  . Tobacco abuse counseling 03/15/2015  . Lymphadenopathy 01/29/2015  . Acquired hypothyroidism 10/17/2014  . Arthritis 10/17/2014  . Back pain, chronic 10/17/2014  . Clinical depression 10/17/2014  . Essential (primary) hypertension 10/17/2014  . Acid reflux 10/17/2014  . Hypercholesteremia 10/17/2014  . Cannot sleep 10/17/2014  . Headache, migraine 10/17/2014  . Acquired polycythemia 10/17/2014  . Compulsive tobacco user syndrome 10/17/2014  . Presence of polypropylene mesh in chest wall 06/01/2013  . Presence of other specified functional implants 06/01/2013  . Disorder of hip joint 07/29/2012  . Chronic pain associated with significant psychosocial dysfunction 05/06/2012   Past Medical History:  Diagnosis Date  . Arthritis   . Chronic pain   . GERD (gastroesophageal reflux disease)   . Hypertension   . Thyroid disease     Past Surgical History:  Procedure Laterality Date  . ABDOMINAL HYSTERECTOMY  2001  . CESAREAN SECTION  1994  . JOINT REPLACEMENT Right 2005, 2010   hip  . PAIN PUMP IMPLANTATION  2008, 2014   Dr Court Joy  . TONSILLECTOMY  1970    No current facility-administered medications for this encounter.   Current Outpatient Medications  Medication Sig Dispense Refill Last Dose  . albuterol (VENTOLIN HFA) 108 (90  Base) MCG/ACT inhaler Inhale 2 puffs into the lungs every 6 (six) hours as needed for wheezing or shortness of breath. 18 g 2   . baclofen (LIORESAL) 0.05 MG/ML injection 1 mL (50 mcg total) by Intrathecal route once for 1 dose. In pain pump 1 mL 0   . Cholecalciferol 125 MCG (5000 UT) TABS Take 5,000 Units by mouth daily.     . cloNIDine (CATAPRES) 0.1 MG tablet Take 1 tablet (0.1 mg total) by mouth 3 (three) times daily. In pain pump 90 tablet  3   . HYDROcodone-acetaminophen (NORCO/VICODIN) 5-325 MG tablet Take 1 tablet by mouth 3 (three) times daily as needed for severe pain. 1 tablet 3 times a day as needed for pain     . HYDROmorphone in sodium chloride 0.9 % 100 mL Inject into the vein continuous. In pain pump  0   . ibuprofen (ADVIL,MOTRIN) 200 MG tablet Take 200 mg by mouth every 6 (six) hours as needed for moderate pain.     Marland Kitchen levothyroxine (SYNTHROID) 112 MCG tablet Take 1 tablet (112 mcg total) by mouth daily. 90 tablet 3   . triamterene-hydrochlorothiazide (DYAZIDE) 37.5-25 MG capsule Take 1 each (1 capsule total) by mouth daily. 90 capsule 1   . metFORMIN (GLUCOPHAGE) 500 MG tablet Take 1 tablet (500 mg total) by mouth daily with breakfast. 30 tablet 0    Allergies  Allergen Reactions  . Morphine Itching and Nausea And Vomiting  . Penicillins Rash    Social History   Tobacco Use  . Smoking status: Current Some Day Smoker    Packs/day: 0.25  . Smokeless tobacco: Never Used  Substance Use Topics  . Alcohol use: No    Family History  Problem Relation Age of Onset  . Diabetes Father   . Breast cancer Neg Hx       Review of Systems  Constitutional: Negative.   HENT: Negative.   Eyes: Negative.   Respiratory: Negative.   Cardiovascular: Negative.   Gastrointestinal: Positive for heartburn.  Genitourinary: Negative.   Musculoskeletal: Positive for joint pain.  Skin: Negative.   Neurological: Positive for headaches.  Endo/Heme/Allergies: Negative.   Psychiatric/Behavioral: Positive for depression.     Objective:  Physical Exam Constitutional:      Appearance: She is well-developed.  HENT:     Head: Normocephalic.  Eyes:     Pupils: Pupils are equal, round, and reactive to light.  Neck:     Thyroid: No thyromegaly.     Vascular: No JVD.     Trachea: No tracheal deviation.  Cardiovascular:     Rate and Rhythm: Normal rate and regular rhythm.     Pulses: Intact distal pulses.  Pulmonary:      Effort: Pulmonary effort is normal. No respiratory distress.     Breath sounds: Normal breath sounds. No wheezing.  Abdominal:     Palpations: Abdomen is soft.     Tenderness: There is no abdominal tenderness. There is no guarding.  Musculoskeletal:     Cervical back: Neck supple.     Right hip: Tenderness and bony tenderness present. Decreased range of motion. Decreased strength.  Lymphadenopathy:     Cervical: No cervical adenopathy.  Skin:    General: Skin is warm and dry.  Neurological:     Mental Status: She is alert and oriented to person, place, and time.  Psychiatric:        Mood and Affect: Mood and affect normal.  Labs:  Estimated body mass index is 25.86 kg/m as calculated from the following:   Height as of 03/23/20: 5' 3.75" (1.619 m).   Weight as of 03/23/20: 67.8 kg.  Imaging Review:  Plain radiographs demonstrate severe degenerative joint disease of the right hip. There is evidence of previous metal-on-metal hip arthroplasty.The bone quality appears to be good for age and reported activity level.    Assessment/Plan:  Right hip with failed previous arthroplasty.  The patient history, physical examination, clinical judgement of the provider and imaging studies are consistent with failure of the right hip, previous total hip arthroplasty. Conversion of the previous total hip arthroplasty is deemed medically necessary. The treatment options including medical management, injection therapy, arthroscopy and arthroplasty were discussed at length. The risks and benefits of total hip arthroplasty were presented and reviewed. The risks due to aseptic loosening, infection, stiffness, dislocation/subluxation,  thromboembolic complications and other imponderables were discussed.  The patient acknowledged the explanation, agreed to proceed with the plan and consent was signed. Patient is being admitted for treatment for surgery, pain control, PT, OT, prophylactic antibiotics,  VTE prophylaxis, progressive ambulation and ADL's and discharge planning. The patient is planning to be discharged home.    Anastasio Auerbach Waylyn Tenbrink   PA-C  03/27/2020, 5:02 PM

## 2020-03-28 NOTE — Patient Instructions (Signed)
DUE TO COVID-19 ONLY ONE VISITOR IS ALLOWED TO COME WITH YOU AND STAY IN THE WAITING ROOM ONLY DURING PRE OP AND PROCEDURE DAY OF SURGERY. THE 1 VISITOR  MAY VISIT WITH YOU AFTER SURGERY IN YOUR PRIVATE ROOM DURING VISITING HOURS ONLY!  YOU NEED TO HAVE A COVID 19 TEST ON: 04/02/20 @ 2:45 PM , THIS TEST MUST BE DONE BEFORE SURGERY,  COVID TESTING SITE [address] [place]  [id number], IT IS ON THE RIGHT GOING OUT [address] APPROXIMATELY  2 MINUTES PAST [employer] ON THE RIGHT. ONCE YOUR COVID TEST IS COMPLETED,  PLEASE BEGIN THE QUARANTINE INSTRUCTIONS AS OUTLINED IN YOUR HANDOUT.                Tasha Nichols   Your procedure is scheduled on: 04/05/20   Report to [facility] Main  Entrance   Report to short stay at : 5:30 AM   Call this number if you have problems the morning of surgery [phone]    Remember:  NO SOLID FOOD AFTER MIDNIGHT THE NIGHT PRIOR TO SURGERY. NOTHING BY MOUTH EXCEPT CLEAR LIQUIDS UNTIL: 4:30 AM .   CLEAR LIQUID DIET  Foods Allowed                                                                     Foods Excluded  Coffee and tea, regular and decaf                             liquids that you cannot  Plain Jell-O any favor except red or purple                                           see through such as: Fruit ices (not with fruit pulp)                                     milk, soups, orange juice  Iced Popsicles                                    All solid food Carbonated beverages, regular and diet                                    Cranberry, grape and apple juices Sports drinks like Gatorade Lightly seasoned clear broth or consume(fat free) Sugar, honey syrup  Sample Menu Breakfast                                Lunch                                     Supper Cranberry juice  Beef broth                            Chicken broth Jell-O                                     Grape juice                            Apple juice Coffee or tea                        Jell-O                                      Popsicle                                                Coffee or tea                        Coffee or tea  _____________________________________________________________________   BRUSH YOUR TEETH MORNING OF SURGERY AND RINSE YOUR MOUTH OUT, NO CHEWING GUM CANDY OR MINTS.    Take these medicines the morning of surgery with A SIP OF WATER: synthroid.Use inhalers as usual.  How to Manage Your Diabetes Before and After Surgery  Why is it important to control my blood sugar before and after surgery? . Improving blood sugar levels before and after surgery helps healing and can limit problems. . A way of improving blood sugar control is eating a healthy diet by: o  Eating less sugar and carbohydrates o  Increasing activity/exercise o  Talking with your doctor about reaching your blood sugar goals . High blood sugars (greater than 180 mg/dL) can raise your risk of infections and slow your recovery, so you will need to focus on controlling your diabetes during the weeks before surgery. . Make sure that the doctor who takes care of your diabetes knows about your planned surgery including the date and location.  How do I manage my blood sugar before surgery? . Check your blood sugar at least 4 times a day, starting 2 days before surgery, to make sure that the level is not too high or low. o Check your blood sugar the morning of your surgery when you wake up and every 2 hours until you get to the Short Stay unit. . If your blood sugar is less than 70 mg/dL, you will need to treat for low blood sugar: o Do not take insulin. o Treat a low blood sugar (less than 70 mg/dL) with  cup of clear juice (cranberry or apple), 4 glucose tablets, OR glucose gel. o Recheck blood sugar in 15 minutes after treatment (to make sure it is greater than 70 mg/dL). If your blood sugar is not greater than 70 mg/dL on  recheck, call [phone] for further instructions. . Report your blood sugar to the short stay nurse when you get to Short Stay.  . If you are admitted to the hospital after surgery: o Your blood sugar will be checked by the staff and you will  probably be given insulin after surgery (instead of oral diabetes medicines) to make sure you have good blood sugar levels. o The goal for blood sugar control after surgery is 80-180 mg/dL.   WHAT DO I DO ABOUT MY DIABETES MEDICATION?  Marland Kitchen Do not take oral diabetes medicines (pills) the morning of surgery.  . THE DAY BEFORE SURGERY, take Metformin as usual      . THE MORNING OF SURGERY, Do not take oral diabetes medicines (pills) the morning of surgery                               You may not have any metal on your body including hair pins and              piercings  Do not wear jewelry, make-up, lotions, powders or perfumes, deodorant             Do not wear nail polish on your fingernails.  Do not shave  48 hours prior to surgery.    Do not bring valuables to the hospital. [employer] IS NOT             RESPONSIBLE   FOR VALUABLES.  Contacts, dentures or bridgework may not be worn into surgery.  Leave suitcase in the car. After surgery it may be brought to your room.     Patients discharged the day of surgery will not be allowed to drive home. IF YOU ARE HAVING SURGERY AND GOING HOME THE SAME DAY, YOU MUST HAVE AN ADULT TO DRIVE YOU HOME AND BE WITH YOU FOR 24 HOURS. YOU MAY GO HOME BY TAXI OR UBER OR ORTHERWISE, BUT AN ADULT MUST ACCOMPANY YOU HOME AND STAY WITH YOU FOR 24 HOURS.  Name and phone number of your driver:  Special Instructions: N/A              Please read over the following fact sheets you were given: _____________________________________________________________________          [facility] - Preparing for Surgery Before surgery, you can play an important role.  Because skin is not sterile, your skin needs to be as free of  germs as possible.  You can reduce the number of germs on your skin by washing with CHG (chlorahexidine gluconate) soap before surgery.  CHG is an antiseptic cleaner which kills germs and bonds with the skin to continue killing germs even after washing. Please DO NOT use if you have an allergy to CHG or antibacterial soaps.  If your skin becomes reddened/irritated stop using the CHG and inform your nurse when you arrive at Short Stay. Do not shave (including legs and underarms) for at least 48 hours prior to the first CHG shower.  You may shave your face/neck. Please follow these instructions carefully:  1.  Shower with CHG Soap the night before surgery and the  morning of Surgery.  2.  If you choose to wash your hair, wash your hair first as usual with your  normal  shampoo.  3.  After you shampoo, rinse your hair and body thoroughly to remove the  shampoo.                           4.  Use CHG as you would any other liquid soap.  You can apply chg directly  to the skin and wash  Gently with a scrungie or clean washcloth.  5.  Apply the CHG Soap to your body ONLY FROM THE NECK DOWN.   Do not use on face/ open                           Wound or open sores. Avoid contact with eyes, ears mouth and genitals (private parts).                       Wash face,  Genitals (private parts) with your normal soap.             6.  Wash thoroughly, paying special attention to the area where your surgery  will be performed.  7.  Thoroughly rinse your body with warm water from the neck down.  8.  DO NOT shower/wash with your normal soap after using and rinsing off  the CHG Soap.                9.  Pat yourself dry with a clean towel.            10.  Wear clean pajamas.            11.  Place clean sheets on your bed the night of your first shower and do not  sleep with pets. Day of Surgery : Do not apply any lotions/deodorants the morning of surgery.  Please wear clean clothes to the  hospital/surgery center.  FAILURE TO FOLLOW THESE INSTRUCTIONS MAY RESULT IN THE CANCELLATION OF YOUR SURGERY PATIENT SIGNATURE_________________________________  NURSE SIGNATURE__________________________________  ________________________________________________________________________   Adam Phenix  An incentive spirometer is a tool that can help keep your lungs clear and active. This tool measures how well you are filling your lungs with each breath. Taking long deep breaths may help reverse or decrease the chance of developing breathing (pulmonary) problems (especially infection) following:  A long period of time when you are unable to move or be active. BEFORE THE PROCEDURE   If the spirometer includes an indicator to show your best effort, your nurse or respiratory therapist will set it to a desired goal.  If possible, sit up straight or lean slightly forward. Try not to slouch.  Hold the incentive spirometer in an upright position. INSTRUCTIONS FOR USE  1. Sit on the edge of your bed if possible, or sit up as far as you can in bed or on a chair. 2. Hold the incentive spirometer in an upright position. 3. Breathe out normally. 4. Place the mouthpiece in your mouth and seal your lips tightly around it. 5. Breathe in slowly and as deeply as possible, raising the piston or the ball toward the top of the column. 6. Hold your breath for 3-5 seconds or for as long as possible. Allow the piston or ball to fall to the bottom of the column. 7. Remove the mouthpiece from your mouth and breathe out normally. 8. Rest for a few seconds and repeat Steps 1 through 7 at least 10 times every 1-2 hours when you are awake. Take your time and take a few normal breaths between deep breaths. 9. The spirometer may include an indicator to show your best effort. Use the indicator as a goal to work toward during each repetition. 10. After each set of 10 deep breaths, practice coughing to be sure  your lungs are clear. If you have an incision (the cut made at the time of  surgery), support your incision when coughing by placing a pillow or rolled up towels firmly against it. Once you are able to get out of bed, walk around indoors and cough well. You may stop using the incentive spirometer when instructed by your caregiver.  RISKS AND COMPLICATIONS  Take your time so you do not get dizzy or light-headed.  If you are in pain, you may need to take or ask for pain medication before doing incentive spirometry. It is harder to take a deep breath if you are having pain. AFTER USE  Rest and breathe slowly and easily.  It can be helpful to keep track of a log of your progress. Your caregiver can provide you with a simple table to help with this. If you are using the spirometer at home, follow these instructions: [place] IF:   You are having difficultly using the spirometer.  You have trouble using the spirometer as often as instructed.  Your pain medication is not giving enough relief while using the spirometer.  You develop fever of 100.5 F (38.1 C) or higher. SEEK IMMEDIATE MEDICAL CARE IF:   You cough up bloody sputum that had not been present before.  You develop fever of 102 F (38.9 C) or greater.  You develop worsening pain at or near the incision site. MAKE SURE YOU:   Understand these instructions.  Will watch your condition.  Will get help right away if you are not doing well or get worse. Document Released: 06/09/2006 Document Revised: 04/21/2011 Document Reviewed: 08/10/2006 [facility] Patient Information 2014 [place], [place].   ________________________________________________________________________

## 2020-03-29 ENCOUNTER — Other Ambulatory Visit: Payer: Self-pay

## 2020-03-29 ENCOUNTER — Encounter (HOSPITAL_COMMUNITY)
Admission: RE | Admit: 2020-03-29 | Discharge: 2020-03-29 | Disposition: A | Discharge status: Home / Self Care | Payer: BC Managed Care – PPO | Source: Ambulatory Visit | Attending: Orthopedic Surgery | Admitting: Orthopedic Surgery

## 2020-03-29 ENCOUNTER — Encounter (HOSPITAL_COMMUNITY): Payer: Self-pay

## 2020-03-29 DIAGNOSIS — Z01812 Encounter for preprocedural laboratory examination: Secondary | ICD-10-CM | POA: Insufficient documentation

## 2020-03-29 HISTORY — DX: Hypothyroidism, unspecified: E03.9

## 2020-03-29 HISTORY — DX: Type 2 diabetes mellitus without complications: E11.9

## 2020-03-29 HISTORY — DX: Dyspnea, unspecified: R06.00

## 2020-03-29 HISTORY — DX: Pneumonia, unspecified organism: J18.9

## 2020-03-29 HISTORY — DX: Chronic obstructive pulmonary disease, unspecified: J44.9

## 2020-03-29 LAB — CBC
HCT: 46.5 % — ABNORMAL HIGH (ref 36.0–46.0)
Hemoglobin: 16.4 g/dL — ABNORMAL HIGH (ref 12.0–15.0)
MCH: 30.5 pg (ref 26.0–34.0)
MCHC: 35.3 g/dL (ref 30.0–36.0)
MCV: 86.4 fL (ref 80.0–100.0)
Platelets: 248 10*3/uL (ref 150–400)
RBC: 5.38 MIL/uL — ABNORMAL HIGH (ref 3.87–5.11)
RDW: 12.1 % (ref 11.5–15.5)
WBC: 7.8 10*3/uL (ref 4.0–10.5)
nRBC: 0 % (ref 0.0–0.2)

## 2020-03-29 LAB — BASIC METABOLIC PANEL
Anion gap: 9 (ref 5–15)
BUN: 22 mg/dL — ABNORMAL HIGH (ref 6–20)
CO2: 30 mmol/L (ref 22–32)
Calcium: 10.7 mg/dL — ABNORMAL HIGH (ref 8.9–10.3)
Chloride: 99 mmol/L (ref 98–111)
Creatinine, Ser: 1.39 mg/dL — ABNORMAL HIGH (ref 0.44–1.00)
GFR, Estimated: 45 mL/min — ABNORMAL LOW (ref >60)
Glucose, Bld: 160 mg/dL — ABNORMAL HIGH (ref 70–99)
Potassium: 3.6 mmol/L (ref 3.5–5.1)
Sodium: 138 mmol/L (ref 135–145)

## 2020-03-29 LAB — SURGICAL PCR SCREEN
MRSA, PCR: NEGATIVE
Staphylococcus aureus: NEGATIVE

## 2020-03-29 LAB — GLUCOSE, CAPILLARY: Glucose-Capillary: 195 mg/dL — ABNORMAL HIGH (ref 70–99)

## 2020-03-29 NOTE — Progress Notes (Addendum)
COVID Vaccine Completed: Yes Date COVID Vaccine completed: 02/2020 COVID vaccine manufacturer: Pfizer   PCP - Victorino Dike Burnette: PA-C. LOV: 03/23/20: Clearance. Cardiologist -   Chest x-ray - 03/16/20 EKG - 03/16/20 Stress Test -  ECHO -  Cardiac Cath -  Pacemaker/ICD device last checked: A1-C: 8.1: 03/16/20 Sleep Study -  CPAP -   Fasting Blood Sugar -N/A  Checks Blood Sugar __0___ times a day  Blood Thinner Instructions: Aspirin Instructions: Last Dose:  Anesthesia review: Hx: HTN, New DIA.  Patient denies shortness of breath, fever, cough and chest pain at PAT appointment   Patient verbalized understanding of instructions that were given to them at the PAT appointment. Patient was also instructed that they will need to review over the PAT instructions again at home before surgery.

## 2020-04-02 ENCOUNTER — Other Ambulatory Visit (HOSPITAL_COMMUNITY)
Admission: RE | Admit: 2020-04-02 | Discharge: 2020-04-02 | Disposition: A | Discharge status: Home / Self Care | Payer: BC Managed Care – PPO | Source: Ambulatory Visit | Attending: Orthopedic Surgery | Admitting: Orthopedic Surgery

## 2020-04-02 DIAGNOSIS — Z01812 Encounter for preprocedural laboratory examination: Secondary | ICD-10-CM | POA: Insufficient documentation

## 2020-04-02 DIAGNOSIS — M1611 Unilateral primary osteoarthritis, right hip: Secondary | ICD-10-CM | POA: Diagnosis not present

## 2020-04-02 DIAGNOSIS — Z79890 Hormone replacement therapy (postmenopausal): Secondary | ICD-10-CM | POA: Diagnosis not present

## 2020-04-02 DIAGNOSIS — J449 Chronic obstructive pulmonary disease, unspecified: Secondary | ICD-10-CM | POA: Diagnosis not present

## 2020-04-02 DIAGNOSIS — E119 Type 2 diabetes mellitus without complications: Secondary | ICD-10-CM | POA: Diagnosis not present

## 2020-04-02 DIAGNOSIS — K219 Gastro-esophageal reflux disease without esophagitis: Secondary | ICD-10-CM | POA: Diagnosis not present

## 2020-04-02 DIAGNOSIS — E559 Vitamin D deficiency, unspecified: Secondary | ICD-10-CM | POA: Diagnosis not present

## 2020-04-02 DIAGNOSIS — F17210 Nicotine dependence, cigarettes, uncomplicated: Secondary | ICD-10-CM | POA: Diagnosis not present

## 2020-04-02 DIAGNOSIS — Z20822 Contact with and (suspected) exposure to covid-19: Secondary | ICD-10-CM | POA: Diagnosis not present

## 2020-04-02 DIAGNOSIS — M1610 Unilateral primary osteoarthritis, unspecified hip: Secondary | ICD-10-CM | POA: Diagnosis not present

## 2020-04-02 DIAGNOSIS — Z96641 Presence of right artificial hip joint: Secondary | ICD-10-CM | POA: Diagnosis not present

## 2020-04-02 DIAGNOSIS — E7800 Pure hypercholesterolemia, unspecified: Secondary | ICD-10-CM | POA: Diagnosis not present

## 2020-04-02 DIAGNOSIS — Z79899 Other long term (current) drug therapy: Secondary | ICD-10-CM | POA: Diagnosis not present

## 2020-04-02 DIAGNOSIS — G894 Chronic pain syndrome: Secondary | ICD-10-CM | POA: Diagnosis not present

## 2020-04-02 DIAGNOSIS — Z7984 Long term (current) use of oral hypoglycemic drugs: Secondary | ICD-10-CM | POA: Diagnosis not present

## 2020-04-02 DIAGNOSIS — I10 Essential (primary) hypertension: Secondary | ICD-10-CM | POA: Diagnosis not present

## 2020-04-02 DIAGNOSIS — Z90710 Acquired absence of both cervix and uterus: Secondary | ICD-10-CM | POA: Diagnosis not present

## 2020-04-02 DIAGNOSIS — Z471 Aftercare following joint replacement surgery: Secondary | ICD-10-CM | POA: Diagnosis not present

## 2020-04-02 DIAGNOSIS — Y792 Prosthetic and other implants, materials and accessory orthopedic devices associated with adverse incidents: Secondary | ICD-10-CM | POA: Diagnosis present

## 2020-04-02 DIAGNOSIS — E039 Hypothyroidism, unspecified: Secondary | ICD-10-CM | POA: Diagnosis not present

## 2020-04-02 DIAGNOSIS — T84090A Other mechanical complication of internal right hip prosthesis, initial encounter: Secondary | ICD-10-CM | POA: Diagnosis not present

## 2020-04-02 LAB — SARS CORONAVIRUS 2 (TAT 6-24 HRS): SARS Coronavirus 2: NEGATIVE

## 2020-04-04 NOTE — Anesthesia Preprocedure Evaluation (Addendum)
Anesthesia Evaluation  Patient identified by MRN, date of birth, ID band Patient awake    Reviewed: Allergy & Precautions, H&P , NPO status , Patient's Chart, lab work & pertinent test results  Airway Mallampati: II  TM Distance: >3 FB Neck ROM: Full    Dental no notable dental hx.    Pulmonary COPD, Current Smoker,    Pulmonary exam normal breath sounds clear to auscultation       Cardiovascular hypertension, Normal cardiovascular exam Rhythm:Regular Rate:Normal     Neuro/Psych Chronic pain negative psych ROS   GI/Hepatic negative GI ROS, Neg liver ROS,   Endo/Other  diabetesHypothyroidism   Renal/GU negative Renal ROS  negative genitourinary   Musculoskeletal negative musculoskeletal ROS (+)   Abdominal   Peds negative pediatric ROS (+)  Hematology negative hematology ROS (+)   Anesthesia Other Findings   Reproductive/Obstetrics negative OB ROS                            Anesthesia Physical Anesthesia Plan  ASA: III  Anesthesia Plan: General   Post-op Pain Management:    Induction: Intravenous  PONV Risk Score and Plan: 2 and Ondansetron, Dexamethasone and Treatment may vary due to age or medical condition  Airway Management Planned: Oral ETT  Additional Equipment:   Intra-op Plan:   Post-operative Plan: Extubation in OR  Informed Consent: I have reviewed the patients History and Physical, chart, labs and discussed the procedure including the risks, benefits and alternatives for the proposed anesthesia with the patient or authorized representative who has indicated his/her understanding and acceptance.     Dental advisory given  Plan Discussed with: CRNA and Surgeon  Anesthesia Plan Comments:         Anesthesia Quick Evaluation

## 2020-04-05 ENCOUNTER — Inpatient Hospital Stay (HOSPITAL_COMMUNITY): Payer: BC Managed Care – PPO | Admitting: Certified Registered Nurse Anesthetist

## 2020-04-05 ENCOUNTER — Other Ambulatory Visit: Payer: Self-pay

## 2020-04-05 ENCOUNTER — Inpatient Hospital Stay (HOSPITAL_COMMUNITY)
Admission: RE | Admit: 2020-04-05 | Discharge: 2020-04-06 | DRG: 468 | Disposition: A | Discharge status: Home / Self Care | Payer: BC Managed Care – PPO | Attending: Orthopedic Surgery | Admitting: Orthopedic Surgery

## 2020-04-05 ENCOUNTER — Encounter (HOSPITAL_COMMUNITY)
Admission: RE | Disposition: A | Discharge status: Home / Self Care | Payer: Self-pay | Source: Ambulatory Visit | Attending: Orthopedic Surgery

## 2020-04-05 ENCOUNTER — Inpatient Hospital Stay (HOSPITAL_COMMUNITY): Payer: BC Managed Care – PPO

## 2020-04-05 ENCOUNTER — Encounter (HOSPITAL_COMMUNITY): Payer: Self-pay | Admitting: Orthopedic Surgery

## 2020-04-05 DIAGNOSIS — K219 Gastro-esophageal reflux disease without esophagitis: Secondary | ICD-10-CM | POA: Diagnosis present

## 2020-04-05 DIAGNOSIS — J449 Chronic obstructive pulmonary disease, unspecified: Secondary | ICD-10-CM | POA: Diagnosis present

## 2020-04-05 DIAGNOSIS — F17210 Nicotine dependence, cigarettes, uncomplicated: Secondary | ICD-10-CM | POA: Diagnosis present

## 2020-04-05 DIAGNOSIS — Y792 Prosthetic and other implants, materials and accessory orthopedic devices associated with adverse incidents: Secondary | ICD-10-CM | POA: Diagnosis present

## 2020-04-05 DIAGNOSIS — Z96649 Presence of unspecified artificial hip joint: Secondary | ICD-10-CM

## 2020-04-05 DIAGNOSIS — T84090A Other mechanical complication of internal right hip prosthesis, initial encounter: Secondary | ICD-10-CM | POA: Diagnosis present

## 2020-04-05 DIAGNOSIS — E119 Type 2 diabetes mellitus without complications: Secondary | ICD-10-CM | POA: Diagnosis present

## 2020-04-05 DIAGNOSIS — G894 Chronic pain syndrome: Secondary | ICD-10-CM | POA: Diagnosis present

## 2020-04-05 DIAGNOSIS — Z7984 Long term (current) use of oral hypoglycemic drugs: Secondary | ICD-10-CM

## 2020-04-05 DIAGNOSIS — M1611 Unilateral primary osteoarthritis, right hip: Secondary | ICD-10-CM | POA: Diagnosis present

## 2020-04-05 DIAGNOSIS — Z79890 Hormone replacement therapy (postmenopausal): Secondary | ICD-10-CM

## 2020-04-05 DIAGNOSIS — E7800 Pure hypercholesterolemia, unspecified: Secondary | ICD-10-CM | POA: Diagnosis present

## 2020-04-05 DIAGNOSIS — Z79899 Other long term (current) drug therapy: Secondary | ICD-10-CM | POA: Diagnosis not present

## 2020-04-05 DIAGNOSIS — E039 Hypothyroidism, unspecified: Secondary | ICD-10-CM | POA: Diagnosis present

## 2020-04-05 DIAGNOSIS — I10 Essential (primary) hypertension: Secondary | ICD-10-CM | POA: Diagnosis present

## 2020-04-05 DIAGNOSIS — Z90710 Acquired absence of both cervix and uterus: Secondary | ICD-10-CM | POA: Diagnosis not present

## 2020-04-05 DIAGNOSIS — Z20822 Contact with and (suspected) exposure to covid-19: Secondary | ICD-10-CM | POA: Diagnosis present

## 2020-04-05 HISTORY — PX: TOTAL HIP REVISION: SHX763

## 2020-04-05 LAB — GLUCOSE, CAPILLARY
Glucose-Capillary: 147 mg/dL — ABNORMAL HIGH (ref 70–99)
Glucose-Capillary: 168 mg/dL — ABNORMAL HIGH (ref 70–99)
Glucose-Capillary: 159 mg/dL — ABNORMAL HIGH (ref 70–99)
Glucose-Capillary: 185 mg/dL — ABNORMAL HIGH (ref 70–99)
Glucose-Capillary: 275 mg/dL — ABNORMAL HIGH (ref 70–99)

## 2020-04-05 LAB — TYPE AND SCREEN
ABO/RH(D): B POS
Antibody Screen: NEGATIVE

## 2020-04-05 SURGERY — TOTAL HIP REVISION
Anesthesia: General | Site: Hip | Laterality: Right

## 2020-04-05 MED ORDER — METHOCARBAMOL 500 MG IVPB - SIMPLE MED
500.0000 mg | Freq: Four times a day (QID) | INTRAVENOUS | Status: DC | PRN
  Administered 2020-04-05: 500 mg via INTRAVENOUS
  Filled 2020-04-05: qty 50

## 2020-04-05 MED ORDER — PHENOL 1.4 % MT LIQD: 1.0000 | OROMUCOSAL | Status: DC | PRN

## 2020-04-05 MED ORDER — FERROUS SULFATE 325 (65 FE) MG PO TABS
325.0000 mg | ORAL_TABLET | Freq: Three times a day (TID) | ORAL | Status: DC
  Administered 2020-04-05 – 2020-04-06 (×3): 325 mg via ORAL
  Filled 2020-04-05 (×3): qty 1

## 2020-04-05 MED ORDER — DEXAMETHASONE SODIUM PHOSPHATE 10 MG/ML IJ SOLN
10.0000 mg | Freq: Once | INTRAMUSCULAR | Status: AC
  Administered 2020-04-05: 8 mg via INTRAVENOUS

## 2020-04-05 MED ORDER — ONDANSETRON HCL 4 MG/2ML IJ SOLN: 4.0000 mg | Freq: Once | INTRAMUSCULAR | Status: DC | PRN

## 2020-04-05 MED ORDER — SODIUM CHLORIDE 0.9 % IV SOLN: INTRAVENOUS | Status: DC

## 2020-04-05 MED ORDER — KETAMINE HCL 10 MG/ML IJ SOLN
INTRAMUSCULAR | Status: AC
  Filled 2020-04-05: qty 1

## 2020-04-05 MED ORDER — ONDANSETRON HCL 4 MG PO TABS
4.0000 mg | ORAL_TABLET | Freq: Four times a day (QID) | ORAL | Status: DC | PRN
  Filled 2020-04-05: qty 1

## 2020-04-05 MED ORDER — CEFAZOLIN SODIUM-DEXTROSE 2-4 GM/100ML-% IV SOLN
2.0000 g | Freq: Four times a day (QID) | INTRAVENOUS | Status: AC
  Administered 2020-04-05 (×2): 2 g via INTRAVENOUS
  Filled 2020-04-05 (×2): qty 100

## 2020-04-05 MED ORDER — CHLORHEXIDINE GLUCONATE 0.12 % MT SOLN
15.0000 mL | Freq: Once | OROMUCOSAL | Status: AC
  Administered 2020-04-05: 15 mL via OROMUCOSAL

## 2020-04-05 MED ORDER — POLYETHYLENE GLYCOL 3350 17 G PO PACK
17.0000 g | PACK | Freq: Two times a day (BID) | ORAL | Status: DC
  Administered 2020-04-05 – 2020-04-06 (×2): 17 g via ORAL
  Filled 2020-04-05 (×2): qty 1

## 2020-04-05 MED ORDER — MAGNESIUM CITRATE PO SOLN: 1.0000 | Freq: Once | ORAL | Status: DC | PRN

## 2020-04-05 MED ORDER — HYDROMORPHONE HCL 1 MG/ML IJ SOLN
INTRAMUSCULAR | Status: DC | PRN
  Administered 2020-04-05 (×2): .5 mg via INTRAVENOUS

## 2020-04-05 MED ORDER — STERILE WATER FOR IRRIGATION IR SOLN
Status: DC | PRN
  Administered 2020-04-05: 2000 mL

## 2020-04-05 MED ORDER — MENTHOL 3 MG MT LOZG: 1.0000 | LOZENGE | OROMUCOSAL | Status: DC | PRN

## 2020-04-05 MED ORDER — METFORMIN HCL 500 MG PO TABS
500.0000 mg | ORAL_TABLET | Freq: Every day | ORAL | Status: DC
Start: 2020-04-06 — End: 2020-04-06
  Administered 2020-04-06: 500 mg via ORAL
  Filled 2020-04-05: qty 1

## 2020-04-05 MED ORDER — FENTANYL CITRATE (PF) 100 MCG/2ML IJ SOLN
INTRAMUSCULAR | Status: AC
  Filled 2020-04-05: qty 2

## 2020-04-05 MED ORDER — METHOCARBAMOL 500 MG IVPB - SIMPLE MED
INTRAVENOUS | Status: AC
  Filled 2020-04-05: qty 50

## 2020-04-05 MED ORDER — PAIN MANAGEMENT IT PUMP REFILL: 1.0000 | INTRATHECAL | Status: DC

## 2020-04-05 MED ORDER — METOCLOPRAMIDE HCL 5 MG/ML IJ SOLN
5.0000 mg | Freq: Three times a day (TID) | INTRAMUSCULAR | Status: DC | PRN
  Administered 2020-04-05: 5 mg via INTRAVENOUS
  Filled 2020-04-05: qty 2

## 2020-04-05 MED ORDER — CEFAZOLIN SODIUM-DEXTROSE 2-4 GM/100ML-% IV SOLN
2.0000 g | INTRAVENOUS | Status: AC
  Administered 2020-04-05: 2 g via INTRAVENOUS
  Filled 2020-04-05: qty 100

## 2020-04-05 MED ORDER — ROCURONIUM BROMIDE 10 MG/ML (PF) SYRINGE
PREFILLED_SYRINGE | INTRAVENOUS | Status: DC | PRN
  Administered 2020-04-05: 80 mg via INTRAVENOUS

## 2020-04-05 MED ORDER — METHOCARBAMOL 500 MG PO TABS
500.0000 mg | ORAL_TABLET | Freq: Four times a day (QID) | ORAL | Status: DC | PRN
  Administered 2020-04-05 (×2): 500 mg via ORAL
  Filled 2020-04-05 (×3): qty 1

## 2020-04-05 MED ORDER — ROCURONIUM BROMIDE 10 MG/ML (PF) SYRINGE
PREFILLED_SYRINGE | INTRAVENOUS | Status: AC
  Filled 2020-04-05: qty 10

## 2020-04-05 MED ORDER — ACETAMINOPHEN 10 MG/ML IV SOLN: 1000.0000 mg | Freq: Once | INTRAVENOUS | Status: DC | PRN

## 2020-04-05 MED ORDER — FENTANYL CITRATE (PF) 250 MCG/5ML IJ SOLN
INTRAMUSCULAR | Status: DC | PRN
  Administered 2020-04-05: 50 ug via INTRAVENOUS

## 2020-04-05 MED ORDER — 0.9 % SODIUM CHLORIDE (POUR BTL) OPTIME
TOPICAL | Status: DC | PRN
  Administered 2020-04-05: 1000 mL

## 2020-04-05 MED ORDER — HYDROMORPHONE HCL 2 MG/ML IJ SOLN
INTRAMUSCULAR | Status: AC
  Filled 2020-04-05: qty 1

## 2020-04-05 MED ORDER — LEVOTHYROXINE SODIUM 112 MCG PO TABS
112.0000 ug | ORAL_TABLET | Freq: Every day | ORAL | Status: DC
  Administered 2020-04-06: 112 ug via ORAL
  Filled 2020-04-05: qty 1

## 2020-04-05 MED ORDER — HYDROCODONE-ACETAMINOPHEN 5-325 MG PO TABS
1.0000 | ORAL_TABLET | Freq: Four times a day (QID) | ORAL | Status: DC | PRN
  Administered 2020-04-05 – 2020-04-06 (×3): 2 via ORAL
  Filled 2020-04-05 (×3): qty 2

## 2020-04-05 MED ORDER — KETAMINE HCL 10 MG/ML IJ SOLN
INTRAMUSCULAR | Status: DC | PRN
  Administered 2020-04-05: 20 mg via INTRAVENOUS
  Administered 2020-04-05 (×3): 10 mg via INTRAVENOUS

## 2020-04-05 MED ORDER — HYDROMORPHONE HCL 1 MG/ML IJ SOLN
INTRAMUSCULAR | Status: AC
  Administered 2020-04-05: 0.5 mg via INTRAVENOUS
  Filled 2020-04-05: qty 2

## 2020-04-05 MED ORDER — MIDAZOLAM HCL 2 MG/2ML IJ SOLN
INTRAMUSCULAR | Status: DC | PRN
  Administered 2020-04-05: 2 mg via INTRAVENOUS

## 2020-04-05 MED ORDER — DOCUSATE SODIUM 100 MG PO CAPS
100.0000 mg | ORAL_CAPSULE | Freq: Two times a day (BID) | ORAL | Status: DC
  Administered 2020-04-05 – 2020-04-06 (×2): 100 mg via ORAL
  Filled 2020-04-05 (×2): qty 1

## 2020-04-05 MED ORDER — HYDROMORPHONE HCL 1 MG/ML IJ SOLN
0.2500 mg | INTRAMUSCULAR | Status: DC | PRN
  Administered 2020-04-05 (×3): 0.5 mg via INTRAVENOUS

## 2020-04-05 MED ORDER — LIDOCAINE 2% (20 MG/ML) 5 ML SYRINGE
INTRAMUSCULAR | Status: DC | PRN
  Administered 2020-04-05: 100 mg via INTRAVENOUS

## 2020-04-05 MED ORDER — SUGAMMADEX SODIUM 200 MG/2ML IV SOLN
INTRAVENOUS | Status: DC | PRN
  Administered 2020-04-05: 140 mg via INTRAVENOUS

## 2020-04-05 MED ORDER — DEXAMETHASONE SODIUM PHOSPHATE 10 MG/ML IJ SOLN
10.0000 mg | Freq: Once | INTRAMUSCULAR | Status: AC
  Administered 2020-04-06: 10 mg via INTRAVENOUS
  Filled 2020-04-05: qty 1

## 2020-04-05 MED ORDER — TRANEXAMIC ACID-NACL 1000-0.7 MG/100ML-% IV SOLN
1000.0000 mg | INTRAVENOUS | Status: AC
  Administered 2020-04-05: 1000 mg via INTRAVENOUS
  Filled 2020-04-05: qty 100

## 2020-04-05 MED ORDER — ONDANSETRON HCL 4 MG/2ML IJ SOLN
4.0000 mg | Freq: Four times a day (QID) | INTRAMUSCULAR | Status: DC | PRN
  Administered 2020-04-05: 4 mg via INTRAVENOUS
  Filled 2020-04-05: qty 2

## 2020-04-05 MED ORDER — DIPHENHYDRAMINE HCL 12.5 MG/5ML PO ELIX: 12.5000 mg | ORAL_SOLUTION | ORAL | Status: DC | PRN

## 2020-04-05 MED ORDER — PHENYLEPHRINE 40 MCG/ML (10ML) SYRINGE FOR IV PUSH (FOR BLOOD PRESSURE SUPPORT)
PREFILLED_SYRINGE | INTRAVENOUS | Status: DC | PRN
  Administered 2020-04-05: 40 ug via INTRAVENOUS

## 2020-04-05 MED ORDER — SODIUM CHLORIDE 0.9 % IR SOLN
Status: DC | PRN
Start: 2020-04-05 — End: 2020-04-05
  Administered 2020-04-05: 1000 mL

## 2020-04-05 MED ORDER — ALBUTEROL SULFATE HFA 108 (90 BASE) MCG/ACT IN AERS: 2.0000 | INHALATION_SPRAY | Freq: Four times a day (QID) | RESPIRATORY_TRACT | Status: DC | PRN

## 2020-04-05 MED ORDER — BISACODYL 10 MG RE SUPP: 10.0000 mg | Freq: Every day | RECTAL | Status: DC | PRN

## 2020-04-05 MED ORDER — ONDANSETRON HCL 4 MG/2ML IJ SOLN
INTRAMUSCULAR | Status: AC
  Filled 2020-04-05: qty 2

## 2020-04-05 MED ORDER — ORAL CARE MOUTH RINSE: 15.0000 mL | Freq: Once | OROMUCOSAL | Status: AC

## 2020-04-05 MED ORDER — MIDAZOLAM HCL 2 MG/2ML IJ SOLN
INTRAMUSCULAR | Status: AC
  Filled 2020-04-05: qty 2

## 2020-04-05 MED ORDER — OXYCODONE HCL 5 MG PO TABS
ORAL_TABLET | ORAL | Status: AC
  Filled 2020-04-05: qty 1

## 2020-04-05 MED ORDER — ALUM & MAG HYDROXIDE-SIMETH 200-200-20 MG/5ML PO SUSP: 15.0000 mL | ORAL | Status: DC | PRN

## 2020-04-05 MED ORDER — TRANEXAMIC ACID-NACL 1000-0.7 MG/100ML-% IV SOLN
1000.0000 mg | Freq: Once | INTRAVENOUS | Status: AC
  Administered 2020-04-05: 1000 mg via INTRAVENOUS
  Filled 2020-04-05: qty 100

## 2020-04-05 MED ORDER — OXYCODONE HCL 5 MG/5ML PO SOLN: 5.0000 mg | Freq: Once | ORAL | Status: AC | PRN

## 2020-04-05 MED ORDER — OXYCODONE HCL 5 MG PO TABS
5.0000 mg | ORAL_TABLET | Freq: Once | ORAL | Status: AC | PRN
  Administered 2020-04-05: 5 mg via ORAL

## 2020-04-05 MED ORDER — TRIAMTERENE-HCTZ 37.5-25 MG PO CAPS
1.0000 | ORAL_CAPSULE | Freq: Every day | ORAL | Status: DC
  Administered 2020-04-06: 1 via ORAL
  Filled 2020-04-05 (×2): qty 1

## 2020-04-05 MED ORDER — INSULIN ASPART 100 UNIT/ML ~~LOC~~ SOLN: 0.0000 [IU] | Freq: Three times a day (TID) | SUBCUTANEOUS | Status: DC

## 2020-04-05 MED ORDER — PROPOFOL 10 MG/ML IV BOLUS
INTRAVENOUS | Status: DC | PRN
  Administered 2020-04-05: 130 mg via INTRAVENOUS

## 2020-04-05 MED ORDER — LACTATED RINGERS IV SOLN: INTRAVENOUS | Status: DC

## 2020-04-05 MED ORDER — ONDANSETRON HCL 4 MG/2ML IJ SOLN
INTRAMUSCULAR | Status: DC | PRN
  Administered 2020-04-05: 4 mg via INTRAVENOUS

## 2020-04-05 MED ORDER — METOCLOPRAMIDE HCL 5 MG PO TABS
5.0000 mg | ORAL_TABLET | Freq: Three times a day (TID) | ORAL | Status: DC | PRN
  Filled 2020-04-05: qty 2

## 2020-04-05 MED ORDER — DEXAMETHASONE SODIUM PHOSPHATE 10 MG/ML IJ SOLN
INTRAMUSCULAR | Status: AC
  Filled 2020-04-05: qty 1

## 2020-04-05 MED ORDER — PROPOFOL 10 MG/ML IV BOLUS
INTRAVENOUS | Status: AC
  Filled 2020-04-05: qty 40

## 2020-04-05 MED ORDER — ASPIRIN 81 MG PO CHEW
81.0000 mg | CHEWABLE_TABLET | Freq: Two times a day (BID) | ORAL | Status: DC
  Administered 2020-04-05 – 2020-04-06 (×2): 81 mg via ORAL
  Filled 2020-04-05 (×2): qty 1

## 2020-04-05 SURGICAL SUPPLY — 55 items
ADH SKN CLS APL DERMABOND .7 (GAUZE/BANDAGES/DRESSINGS) ×1
BAG DECANTER FOR FLEXI CONT (MISCELLANEOUS) ×2 IMPLANT
BAG SPEC THK2 15X12 ZIP CLS (MISCELLANEOUS) ×1
BAG ZIPLOCK 12X15 (MISCELLANEOUS) ×2 IMPLANT
BLADE SAW SGTL 18X1.27X75 (BLADE) IMPLANT
BRUSH FEMORAL CANAL (MISCELLANEOUS) IMPLANT
COVER SURGICAL LIGHT HANDLE (MISCELLANEOUS) ×2 IMPLANT
COVER WAND RF STERILE (DRAPES) IMPLANT
DERMABOND ADVANCED (GAUZE/BANDAGES/DRESSINGS) ×1
DERMABOND ADVANCED .7 DNX12 (GAUZE/BANDAGES/DRESSINGS) ×1 IMPLANT
DRAPE INCISE IOBAN 85X60 (DRAPES) ×2 IMPLANT
DRAPE ORTHO SPLIT 77X108 STRL (DRAPES) ×4
DRAPE POUCH INSTRU U-SHP 10X18 (DRAPES) ×2 IMPLANT
DRAPE SURG 17X11 SM STRL (DRAPES) ×2 IMPLANT
DRAPE SURG ORHT 6 SPLT 77X108 (DRAPES) ×2 IMPLANT
DRAPE U-SHAPE 47X51 STRL (DRAPES) ×2 IMPLANT
DRESSING AQUACEL AG SP 3.5X10 (GAUZE/BANDAGES/DRESSINGS) IMPLANT
DRSG AQUACEL AG ADV 3.5X10 (GAUZE/BANDAGES/DRESSINGS) IMPLANT
DRSG AQUACEL AG ADV 3.5X14 (GAUZE/BANDAGES/DRESSINGS) IMPLANT
DRSG AQUACEL AG SP 3.5X10 (GAUZE/BANDAGES/DRESSINGS) ×2
DURAPREP 26ML APPLICATOR (WOUND CARE) ×2 IMPLANT
ELECT BLADE TIP CTD 4 INCH (ELECTRODE) ×2 IMPLANT
ELECT REM PT RETURN 15FT ADLT (MISCELLANEOUS) ×2 IMPLANT
FACESHIELD WRAPAROUND (MASK) ×8 IMPLANT
GLOVE SURG ENC MOIS LTX SZ6 (GLOVE) ×4 IMPLANT
GLOVE SURG LTX SZ8 (GLOVE) ×4 IMPLANT
GLOVE SURG UNDER LTX SZ6.5 (GLOVE) ×2 IMPLANT
GLOVE SURG UNDER POLY LF SZ7.5 (GLOVE) ×2 IMPLANT
GLOVE SURG UNDER POLY LF SZ8.5 (GLOVE) ×2 IMPLANT
GOWN STRL REUS W/TWL 2XL LVL3 (GOWN DISPOSABLE) ×2 IMPLANT
GOWN STRL REUS W/TWL LRG LVL3 (GOWN DISPOSABLE) ×4 IMPLANT
HANDPIECE INTERPULSE COAX TIP (DISPOSABLE) ×2
HEAD CERAMIC BIOLOX 36 +3 (Hips) ×1 IMPLANT
KIT BASIN OR (CUSTOM PROCEDURE TRAY) ×2 IMPLANT
KIT TURNOVER KIT A (KITS) ×2 IMPLANT
LINER NEUTRAL 52X36MM PLUS 4 (Liner) ×2 IMPLANT
MANIFOLD NEPTUNE II (INSTRUMENTS) ×2 IMPLANT
NDL SAFETY ECLIPSE 18X1.5 (NEEDLE) ×1 IMPLANT
NEEDLE HYPO 18GX1.5 SHARP (NEEDLE) ×2
NS IRRIG 1000ML POUR BTL (IV SOLUTION) ×2 IMPLANT
PACK TOTAL JOINT (CUSTOM PROCEDURE TRAY) ×2 IMPLANT
PENCIL SMOKE EVACUATOR (MISCELLANEOUS) ×2 IMPLANT
PROTECTOR NERVE ULNAR (MISCELLANEOUS) ×2 IMPLANT
SET HNDPC FAN SPRY TIP SCT (DISPOSABLE) ×1 IMPLANT
SPONGE LAP 18X18 RF (DISPOSABLE) IMPLANT
STAPLER VISISTAT 35W (STAPLE) IMPLANT
SUCTION FRAZIER HANDLE 12FR (TUBING) ×2
SUCTION TUBE FRAZIER 12FR DISP (TUBING) ×1 IMPLANT
SUT STRATAFIX PDS+ 0 24IN (SUTURE) ×2 IMPLANT
SUT VIC AB 1 CT1 36 (SUTURE) ×2 IMPLANT
SUT VIC AB 2-0 CT1 27 (SUTURE) ×4
SUT VIC AB 2-0 CT1 TAPERPNT 27 (SUTURE) ×2 IMPLANT
TOWEL OR 17X26 10 PK STRL BLUE (TOWEL DISPOSABLE) ×4 IMPLANT
TRAY FOLEY MTR SLVR 16FR STAT (SET/KITS/TRAYS/PACK) ×2 IMPLANT
WATER STERILE IRR 1000ML POUR (IV SOLUTION) ×4 IMPLANT

## 2020-04-05 NOTE — Evaluation (Signed)
Physical Therapy Evaluation Patient Details Name: Tasha Nichols MRN: [id number] DOB: 05-Oct-1965 Today's Date: 04/05/2020   History of Present Illness  patient is a 55 y.o. 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  Clinical Impression  Pt is a 54y.o. female s/p Rt total hip revision POD 0. Pt reports that she is independent with mobility at baseline. Therapist reviewed posterior hip precautions and provided handout, pt verbalized understanding. Pt required MIN guard and verbal cues for sit to stand transfers. Pt required MIN guard for ambulation 178ft with verbal cues for RW management and step to gait pattern with cues for turns to maintain posterior hip precuations, pt progressed to step through pattern with no LOB. Pt will have assist from husband and family upon discharge. Pt will benefit from skilled PT to increase independence and safety with mobility. Acute therapy to follow up during stay.      Follow Up Recommendations Follow surgeon's recommendation for DC plan and follow-up therapies    Equipment Recommendations  None recommended by PT (pt owns RW)    Recommendations for Other Services       Precautions / Restrictions Precautions Precautions: Posterior Hip;Fall Precaution Booklet Issued: Yes (comment) Precaution Comments: PT reviewed posterior hip precautions and provided handouts Restrictions Weight Bearing Restrictions: No Other Position/Activity Restrictions: WBAT      Mobility  Bed Mobility Overal bed mobility: Needs Assistance Bed Mobility: Supine to Sit     Supine to sit: Supervision;HOB elevated     General bed mobility comments: use of B UEs to scoot to EOB with supervision for safety and cues to  maintain posterior hip precautions    Transfers Overall transfer level: Needs assistance Equipment used: Rolling walker (2 wheeled) Transfers: Sit to/from Stand Sit to Stand: Min guard;From elevated surface         General transfer comment: MIN guard for safety with cues for safe hand placement and elevated surface to assist with maintaining precautions  Ambulation/Gait Ambulation/Gait assistance: Min guard Gait Distance (Feet): 120 Feet Assistive device: Rolling walker (2 wheeled) Gait Pattern/deviations: Step-to pattern;Step-through pattern;Decreased weight shift to right;Decreased stride length Gait velocity: fair   General Gait Details: MIN guard for safety with cues for step to gait pattern and RW management. Pt progressing to step through with no LOB. PT provided cues for turning with RW to assist with maintaining posterior hip precautions.  Stairs            Wheelchair Mobility    Modified Rankin (Stroke Patients Only)       Balance Overall balance assessment: Needs assistance Sitting-balance support: Feet supported Sitting balance-Leahy Scale: Good     Standing balance support: Bilateral upper extremity supported;During functional activity Standing balance-Leahy Scale: Poor Standing balance comment: use of RW                             Pertinent Vitals/Pain Pain Assessment: Faces Faces Pain Scale: Hurts little more Pain Location: Rt hip Pain Descriptors / Indicators: Sore Pain Intervention(s): Limited activity within patient's tolerance;Monitored during session;Repositioned    Home Living Family/patient expects to be discharged to:: Private residence Living Arrangements: Spouse/significant other;Children Available Help at Discharge: Family Type of Home: House Home Access: Stairs to enter  Entrance Stairs-Rails: None Entrance Stairs-Number of Steps: 1 Home Layout: Two level;Able to live on main level with bedroom/bathroom Home  Equipment: Dan Humphreys - 2 wheels;Bedside commode;Shower seat Additional Comments: Pt stays on main level and will ahve assist from husband and daughter at home.    Prior Function Level of Independence: Independent               Hand Dominance   Dominant Hand: Right    Extremity/Trunk Assessment   Upper Extremity Assessment Upper Extremity Assessment: Overall WFL for tasks assessed    Lower Extremity Assessment Lower Extremity Assessment: RLE deficits/detail RLE Deficits / Details: Pt with good Rt quad set strength and able to perform LAQ with no increased pain. RLE Sensation: WNL RLE Coordination: WNL    Cervical / Trunk Assessment Cervical / Trunk Assessment: Normal  Communication   Communication: No difficulties  Cognition Arousal/Alertness: Awake/alert Behavior During Therapy: WFL for tasks assessed/performed Overall Cognitive Status: Within Functional Limits for tasks assessed                                        General Comments      Exercises Total Joint Exercises Ankle Circles/Pumps: AROM;Both;20 reps;Seated Quad Sets: AROM;Right;Seated;5 reps Long Arc Quad: AROM;Right;5 reps;Seated   Assessment/Plan    PT Assessment Patient needs continued PT services  PT Problem List Decreased strength;Decreased range of motion;Decreased balance;Decreased activity tolerance;Decreased mobility;Decreased knowledge of use of DME;Pain       PT Treatment Interventions DME instruction;Gait training;Stair training;Functional mobility training;Therapeutic activities;Therapeutic exercise;Balance training;Patient/family education    PT Goals (Current goals can be found in the Care Plan section)  Acute Rehab PT Goals Patient Stated Goal: [place] with [place] PT Goal Formulation: With patient Time For Goal Achievement: 04/12/20 Potential to Achieve Goals: Good    Frequency 7X/week   Barriers to discharge        Co-evaluation                AM-PAC PT "6 Clicks" Mobility  Outcome Measure Help needed turning from your back to your side while in a flat bed without using bedrails?: None Help needed moving from lying on your back to sitting on the side of a flat bed without using bedrails?: None Help needed moving to and from a bed to a chair (including a wheelchair)?: A Little Help needed standing up from a chair using your arms (e.g., wheelchair or bedside chair)?: A Little Help needed to walk in hospital room?: A Little Help needed climbing 3-5 steps with a railing? : A Little 6 Click Score: 20    End of Session Equipment Utilized During Treatment: Gait belt Activity Tolerance: Patient tolerated treatment well Patient left: in chair;with call bell/phone within reach;with chair alarm set Nurse Communication: Mobility status PT Visit Diagnosis: Unsteadiness on feet (R26.81);Muscle weakness (generalized) (M62.81);Pain Pain - Right/Left: Right Pain - part of body: Hip    Time: 1543-1600 PT Time Calculation (min) (ACUTE ONLY): 17 min   Charges:              Loyal Gambler, SPT  Acute rehab    Loyal Gambler 04/05/2020, 5:13 PM

## 2020-04-05 NOTE — Anesthesia Postprocedure Evaluation (Signed)
Anesthesia Post Note  [name]  Procedure(s) Performed: Revision Right total hip arthroplasty conversion of metal to ceramic with debridement (Right Hip)     Patient location during evaluation: PACU Anesthesia Type: General Level of consciousness: awake and alert Pain management: pain level controlled Vital Signs Assessment: post-procedure vital signs reviewed and stable Respiratory status: spontaneous breathing, nonlabored ventilation, respiratory function stable and patient connected to nasal cannula oxygen Cardiovascular status: blood pressure returned to baseline and stable Postop Assessment: no apparent nausea or vomiting Anesthetic complications: no   No complications documented.  Last Vitals:  Vitals:   04/05/20 0950 04/05/20 1000  BP:  (!) 144/87  Pulse: 85 78  Resp: (!) 23 (!) 22  Temp:    SpO2: 98% 95%    Last Pain:  Vitals:   04/05/20 1000  TempSrc:   PainSc: 10-Worst pain ever                 Tito Ausmus S

## 2020-04-05 NOTE — Transfer of Care (Signed)
Immediate Anesthesia Transfer of Care Note  [name]  Procedure(s) Performed: Revision Right total hip arthroplasty conversion of metal to ceramic with debridement (Right Hip)  Patient Location: PACU  Anesthesia Type:General  Level of Consciousness: awake, drowsy and patient cooperative  Airway & Oxygen Therapy: Patient Spontanous Breathing and Patient connected to face mask oxygen  Post-op Assessment: Report given to RN and Post -op Vital signs reviewed and stable  Post vital signs: Reviewed and stable  Last Vitals:  Vitals Value Taken Time  BP 136/94 04/05/20 0930  Temp    Pulse 81 04/05/20 0930  Resp 13 04/05/20 0930  SpO2 100 % 04/05/20 0930  Vitals shown include unvalidated device data.  Last Pain:  Vitals:   04/05/20 0623  TempSrc: Oral  PainSc:       Patients Stated Pain Goal: 8 (04/05/20 0559)  Complications: No complications documented.

## 2020-04-05 NOTE — Interval H&P Note (Signed)
History and Physical Interval Note:  04/05/2020 6:54 AM  Tasha Nichols  has presented today for surgery, with the diagnosis of Failed right total hip arthroplasty revision.  The various methods of treatment have been discussed with the patient and family. After consideration of risks, benefits and other options for treatment, the patient has consented to  Procedure(s) with comments: Revision Right total hip arthroplasty conversion of metal to ceramic with debridement (Right) - 90 mins as a surgical intervention.  The patient's history has been reviewed, patient examined, no change in status, stable for surgery.  I have reviewed the patient's chart and labs.  Questions were answered to the patient's satisfaction.     [name]

## 2020-04-05 NOTE — Anesthesia Procedure Notes (Signed)
Procedure Name: Intubation Date/Time: 04/05/2020 7:42 AM Performed by: Eben Burow, CRNA Pre-anesthesia Checklist: Patient identified, Emergency Drugs available, Suction available, Patient being monitored and Timeout performed Patient Re-evaluated:Patient Re-evaluated prior to induction Oxygen Delivery Method: Circle system utilized Preoxygenation: Pre-oxygenation with 100% oxygen Induction Type: IV induction Ventilation: Mask ventilation without difficulty Laryngoscope Size: Mac and 4 Grade View: Grade I Tube type: Oral Tube size: 7.0 mm Number of attempts: 1 Airway Equipment and Method: Stylet Placement Confirmation: ETT inserted through vocal cords under direct vision,  positive ETCO2 and breath sounds checked- equal and bilateral Secured at: 22 cm Tube secured with: Tape Dental Injury: Teeth and Oropharynx as per pre-operative assessment

## 2020-04-05 NOTE — Brief Op Note (Signed)
04/05/2020  9:09 AM  PATIENT:  Tasha Nichols  55 y.o. 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  PROCEDURE:  Procedure(s) with comments: Revision Right total hip arthroplasty conversion of metal to ceramic on polyethylene with debridement (Right) - 90 mins  SURGEON:  Surgeon(s) and Role:    Durene Romans, MD - Primary  PHYSICIAN ASSISTANT: Dennie Bible, PA-C  ANESTHESIA:   general  EBL:  150 mL   BLOOD ADMINISTERED:none  DRAINS: none   LOCAL MEDICATIONS USED:  NONE  SPECIMEN:  No Specimen  DISPOSITION OF SPECIMEN:  N/A  COUNTS:  YES  TOURNIQUET:  * No tourniquets in log *  DICTATION: .Other Dictation: Dictation Number [id number]  PLAN OF CARE: Admit for overnight observation  PATIENT DISPOSITION:  PACU - hemodynamically stable.   Delay start of Pharmacological VTE agent (>24hrs) due to surgical blood loss or risk of bleeding: no

## 2020-04-05 NOTE — Discharge Instructions (Signed)
INSTRUCTIONS AFTER JOINT REPLACEMENT   o Remove items at home which could result in a fall. This includes throw rugs or furniture in walking pathways o ICE to the affected joint every three hours while awake for 30 minutes at a time, for at least the first 3-5 days, and then as needed for pain and swelling.  Continue to use ice for pain and swelling. You may notice swelling that will progress down to the foot and ankle.  This is normal after surgery.  Elevate your leg when you are not up walking on it.   o Continue to use the breathing machine you got in the hospital (incentive spirometer) which will help keep your temperature down.  It is common for your temperature to cycle up and down following surgery, especially at night when you are not up moving around and exerting yourself.  The breathing machine keeps your lungs expanded and your temperature down.   DIET:  As you were doing prior to hospitalization, we recommend a well-balanced diet.  DRESSING / WOUND CARE / SHOWERING  Keep the surgical dressing until follow up.  The dressing is water proof, so you can shower without any extra covering.  IF THE DRESSING FALLS OFF or the wound gets wet inside, change the dressing with sterile gauze.  Please use good hand washing techniques before changing the dressing.  Do not use any lotions or creams on the incision until instructed by your surgeon.    ACTIVITY  o Increase activity slowly as tolerated, but follow the weight bearing instructions below.   o No driving for 6 weeks or until further direction given by your physician.  You cannot drive while taking narcotics.  o No lifting or carrying greater than 10 lbs. until further directed by your surgeon. o Avoid periods of inactivity such as sitting longer than an hour when not asleep. This helps prevent blood clots.  o You may return to work once you are authorized by your doctor.     WEIGHT BEARING   Weight bearing as tolerated with assist  device (walker, cane, etc) as directed, use it as long as suggested by your surgeon or therapist, typically at least 4-6 weeks.   CONSTIPATION  Constipation is defined medically as fewer than three stools per week and severe constipation as less than one stool per week.  Even if you have a regular bowel pattern at home, your normal regimen is likely to be disrupted due to multiple reasons following surgery.  Combination of anesthesia, postoperative narcotics, change in appetite and fluid intake all can affect your bowels.   YOU MUST use at least one of the following options; they are listed in order of increasing strength to get the job done.  They are all available over the counter, and you may need to use some, POSSIBLY even all of these options:    Drink plenty of fluids (prune juice may be helpful) and high fiber foods Colace 100 mg by mouth twice a day  Senokot for constipation as directed and as needed Dulcolax (bisacodyl), take with full glass of water  Miralax (polyethylene glycol) once or twice a day as needed.  If you have tried all these things and are unable to have a bowel movement in the first 3-4 days after surgery call either your surgeon or your primary doctor.    If you experience loose stools or diarrhea, hold the medications until you stool forms back up.  If your symptoms do not get   better within 1 week or if they get worse, check with your doctor.  If you experience "the worst abdominal pain ever" or develop nausea or vomiting, please contact the office immediately for further recommendations for treatment.   ITCHING:  If you experience itching with your medications, try taking only a single pain pill, or even half a pain pill at a time.  You can also use Benadryl over the counter for itching or also to help with sleep.   TED HOSE STOCKINGS:  Use stockings on both legs until for at least 2 weeks or as directed by physician office. They may be removed at night for  sleeping.  MEDICATIONS:  See your medication summary on the "After Visit Summary" that nursing will review with you.  You may have some home medications which will be placed on hold until you complete the course of blood thinner medication.  It is important for you to complete the blood thinner medication as prescribed.  PRECAUTIONS:  If you experience chest pain or shortness of breath - call 911 immediately for transfer to the hospital emergency department.   If you develop a fever greater that 101 F, purulent drainage from wound, increased redness or drainage from wound, foul odor from the wound/dressing, or calf pain - CONTACT YOUR SURGEON.                                                   FOLLOW-UP APPOINTMENTS:  If you do not already have a post-op appointment, please call the office for an appointment to be seen by your surgeon.  Guidelines for how soon to be seen are listed in your "After Visit Summary", but are typically between 1-4 weeks after surgery.  OTHER INSTRUCTIONS:   Knee Replacement:  Do not place pillow under knee, focus on keeping the knee straight while resting. CPM instructions: 0-90 degrees, 2 hours in the morning, 2 hours in the afternoon, and 2 hours in the evening. Place foam block, curve side up under heel at all times except when in CPM or when walking.  DO NOT modify, tear, cut, or change the foam block in any way.   DENTAL ANTIBIOTICS:  In most cases prophylactic antibiotics for Dental procdeures after total joint surgery are not necessary.  Exceptions are as follows:  1. History of prior total joint infection  2. Severely immunocompromised (Organ Transplant, cancer chemotherapy, Rheumatoid biologic meds such as Humera)  3. Poorly controlled diabetes (A1C &gt; 8.0, blood glucose over 200)  If you have one of these conditions, contact your surgeon for an antibiotic prescription, prior to your dental procedure.   MAKE SURE YOU:  . Understand these  instructions.  . Get help right away if you are not doing well or get worse.    Thank you for letting us be a part of your medical care team.  It is a privilege we respect greatly.  We hope these instructions will help you stay on track for a fast and full recovery!    

## 2020-04-05 NOTE — Op Note (Signed)
Tasha Nichols MEDICAL RECORD NO: [id number] ACCOUNT NO: [id number] DATE OF BIRTH: 1965-10-11 FACILITY: [facility] LOCATION: [id number] PHYSICIAN: Madlyn Frankel. 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  PROCEDURE: Revision right total hip arthroplasty conversion from metal-on-metal modular components to a 36+3 delta ceramic ball articulating with a 36+4 polyethylene.  COMPONENTS UTILIZED: DePuy matching previously placed S-ROM femoral component.  SURGEON: Madlyn Frankel. Charlann Boxer, MD  ASSISTANT:  Dennie Bible, PA-C.  Note that Ms. Tasha Nichols was present for the entirety of the case for preoperative positioning, perioperative management of the operative extremity and general facilitation of the case, primary wound closure.  ANESTHESIA: General.  ESTIMATED BLOOD LOSS: About 150 mL.  DRAINS:  None.  COMPLICATIONS:  None apparent.  INDICATIONS OF PROCEDURE:  The patient is a 55 year old female with a history of primary total hip arthroplasty utilizing DePuy S-ROM components with a modular metal-on-metal hip system.  Her postoperative course had been complicated with fracture requiring revision surgery.  She  has chronic pain issues requiring indwelling pump and catheter placement for pain.  She had been seen and evaluated for right hip pain.  Workup in the office had revealed asymmetric increases of her serum cobalt versus chromium.  Additionally,  metal-suppression MRI.  Given her pain and the persistently elevated levels, we discussed revising her metal-on-metal components to a ceramic on polyethylene to eliminate the source of metallosis.  We reviewed the risk of infection, DVT, the increased  rates of dislocation  with single component revision type procedures.  Consent was obtained to manage her metallosis and convert her to ceramic on polyethylene.  Consent was obtained.  PROCEDURE IN DETAIL:  The patient was brought to the operative theater.  Once adequate anesthesia, preoperative antibiotics, Ancef administered as well as tranexamic acid, she was positioned into the left lateral decubitus position with the right hip up.  The right lower  extremity was prepped and draped in sterile fashion.  A timeout was performed identifying the patient, planned procedure, and extremity.  Her old incision was identified and marked on her skin.  We used a portion of this given the history of revision  requiring distal extension.  A sharp excisional debridement of the scar was carried out and then soft tissue planes created.  I then incised through the iliotibial band and gluteal fascia.  Here, we immediately saw evidence of metal staining of the  bursal tissue.  There was no evidence of any muscle damage.  The muscle was red and contractile.  At this point, I used a Bovie cautery to sharply excise the entire bursa and then capsular tissues.  This was first carried out posteriorly.  I took this  dissection and excision down to normal tissue.  Attention was directed at protecting the sciatic nerve posteriorly.  Once I had the posterior two-thirds of the acetabular and hip exposed, we dislocated the hip, removed the old femoral head and found the  components were stable.  We then removed the metal liner and the previously placed hole eliminator.  At this point, I was able to debride further anteriorly.  Again removing all mental stained synovial tissue carefully back to normal tissue.  I then  placed a 36+4 trial liner to match a 52 mm shell.  I  then did a trial reduction with first a 36+0 and then a 36+3 ball.  I felt that her stability of the hip was better with a 36+3 ball.  There was some evidence of chronic impingement with  extension and  external rotation; however, with forward flexion and internal rotation, no signs of instability.  Given these findings, we dislocated the hip.  I irrigated the hip with pulse lavage normal saline solution.  We placed the final 36+4 neutral AltrX liner to  match the 52 mm shell.  I then opened and though not true label use for the component I impacted a 36+3 delta ceramic ball onto a clean and dried trunnion.  I did this based on her metallosis and the need to get her away from a metal-on-metal trunnion  basis and the metal-on-metal articular surface.  We completed irrigation.  Further debridement was carried out as necessary.  I did find a pocket of semi-necrotic type tissue related to metal-on-metal underneath her tensor fascia anteriorly.  This was  debrided.  The hip was non-excisionally debrided using 1 liter normal saline solution.  At the completion, there was no capsular tissue to repair.  The iliotibial band and gluteal fascia were reapproximated using #1 Vicryl and #1 Stratafix suture.  The  remainder of the wound was closed in layers with 2-0 Vicryl and a running Monocryl stitch.  The hip wound was then cleaned, dried, and dressed sterilely using surgical glue and Aquacel dressing.  The patient was then brought to the recovery room in  stable condition tolerating the procedure well.  Findings were reviewed with the family.  Postoperatively, she will be weightbearing as tolerated.  She will have posterior hip precautions for 6 to 12 weeks depending on her strength and return of  function.   [facility] D: 04/05/2020 9:22:41 am T: 04/05/2020 12:53:00 pm  JOB: [id number]/ [id number]

## 2020-04-06 ENCOUNTER — Encounter (HOSPITAL_COMMUNITY): Payer: Self-pay | Admitting: Orthopedic Surgery

## 2020-04-06 LAB — CBC
HCT: 37.8 % (ref 36.0–46.0)
Hemoglobin: 12.9 g/dL (ref 12.0–15.0)
MCH: 30.5 pg (ref 26.0–34.0)
MCHC: 34.1 g/dL (ref 30.0–36.0)
MCV: 89.4 fL (ref 80.0–100.0)
Platelets: 198 10*3/uL (ref 150–400)
RBC: 4.23 MIL/uL (ref 3.87–5.11)
RDW: 12.3 % (ref 11.5–15.5)
WBC: 13.3 10*3/uL — ABNORMAL HIGH (ref 4.0–10.5)
nRBC: 0 % (ref 0.0–0.2)

## 2020-04-06 LAB — BASIC METABOLIC PANEL
Anion gap: 9 (ref 5–15)
BUN: 17 mg/dL (ref 6–20)
CO2: 26 mmol/L (ref 22–32)
Calcium: 8.8 mg/dL — ABNORMAL LOW (ref 8.9–10.3)
Chloride: 103 mmol/L (ref 98–111)
Creatinine, Ser: 1.25 mg/dL — ABNORMAL HIGH (ref 0.44–1.00)
GFR, Estimated: 51 mL/min — ABNORMAL LOW (ref >60)
Glucose, Bld: 157 mg/dL — ABNORMAL HIGH (ref 70–99)
Potassium: 3.1 mmol/L — ABNORMAL LOW (ref 3.5–5.1)
Sodium: 138 mmol/L (ref 135–145)

## 2020-04-06 LAB — GLUCOSE, CAPILLARY: Glucose-Capillary: 135 mg/dL — ABNORMAL HIGH (ref 70–99)

## 2020-04-06 MED ORDER — POLYETHYLENE GLYCOL 3350 17 G PO PACK: 17.0000 g | PACK | Freq: Two times a day (BID) | ORAL | 0 refills | Status: DC

## 2020-04-06 MED ORDER — FERROUS SULFATE 325 (65 FE) MG PO TABS: 325.0000 mg | ORAL_TABLET | Freq: Three times a day (TID) | ORAL | 0 refills | Status: DC

## 2020-04-06 MED ORDER — DOCUSATE SODIUM 100 MG PO CAPS: 100.0000 mg | ORAL_CAPSULE | Freq: Two times a day (BID) | ORAL | 0 refills | Status: DC

## 2020-04-06 MED ORDER — ASPIRIN 81 MG PO CHEW: 81.0000 mg | CHEWABLE_TABLET | Freq: Two times a day (BID) | ORAL | 0 refills | Status: AC

## 2020-04-06 MED ORDER — HYDROCODONE-ACETAMINOPHEN 5-325 MG PO TABS: 1.0000 | ORAL_TABLET | ORAL | 0 refills | Status: DC | PRN

## 2020-04-06 NOTE — Progress Notes (Signed)
Patient ID: Tasha Nichols, female   DOB: 11/01/1965, 55 y.o.   MRN: [id number] Subjective: 1 Day Post-Op Procedure(s) (LRB): Revision Right total hip arthroplasty conversion of metal to ceramic with debridement (Right)    Patient reports pain as mild.  Doing well over all, no events. Ready to go home.  Objective:   VITALS:   Vitals:   04/06/20 0134 04/06/20 0618  BP: 127/75 120/71  Pulse: 67 65  Resp: 15 16  Temp: 98.9 F (37.2 C) 98.7 F (37.1 C)  SpO2: 95% 97%    Neurovascular intact Incision: dressing C/D/I  LABS Recent Labs    04/06/20 0320  HGB 12.9  HCT 37.8  WBC 13.3*  PLT 198    Recent Labs    04/06/20 0320  NA 138  K 3.1*  BUN 17  CREATININE 1.25*  GLUCOSE 157*    No results for input(s): LABPT, INR in the last 72 hours.   Assessment/Plan: 1 Day Post-Op Procedure(s) (LRB): Revision Right total hip arthroplasty conversion of metal to ceramic with debridement (Right)   Advance diet Up with therapy  Home today after therapy likely before lunch RTC in 2 weeks WBAT RLE with posterior hip precautions

## 2020-04-09 NOTE — Discharge Summary (Signed)
Patient ID: Tasha Nichols MRN: [id number] DOB/AGE: 1965-06-20 55 y.o.  Admit date: 04/05/2020 Discharge date: 04/06/2020  Admission Diagnoses:  Active Problems:   S/P revision of total hip   Discharge Diagnoses:  Same  Past Medical History:  Diagnosis Date  . Arthritis   . Chronic pain   . COPD (chronic obstructive pulmonary disease) (HCC)   . Diabetes mellitus without complication (HCC)   . Dyspnea   . GERD (gastroesophageal reflux disease)   . Hypertension   . Hypothyroidism   . Pneumonia   . Thyroid disease     Surgeries: Procedure(s): Revision Right total hip arthroplasty conversion of metal to ceramic with debridement on 04/05/2020   Consultants:  N/A  Discharged Condition: Improved  Hospital Course: Tasha Nichols is an 55 y.o. female who was admitted 04/05/2020 for operative treatment of right hip pain and swelling due to metalosis from previous THA. Patient has severe unremitting pain that affects sleep, daily activities, and work/hobbies. After pre-op clearance the patient was taken to the operating room on 04/05/2020 and underwent  Procedure(s): Revision Right total hip arthroplasty conversion of metal to ceramic with debridement.    Patient was given perioperative antibiotics:  Anti-infectives (From admission, onward)   Start     Dose/Rate Route Frequency Ordered Stop   04/05/20 1400  ceFAZolin (ANCEF) IVPB 2g/100 mL premix        2 g 200 mL/hr over 30 Minutes Intravenous Every 6 hours 04/05/20 0923 04/05/20 2140   04/05/20 0600  ceFAZolin (ANCEF) IVPB 2g/100 mL premix        2 g 200 mL/hr over 30 Minutes Intravenous On call to O.R. 04/05/20 0534 04/05/20 8675       Patient was given sequential compression devices, early ambulation, and chemoprophylaxis to prevent DVT.  Patient benefited maximally from hospital stay and there were no complications.    Recent vital signs: No data found.   Recent laboratory studies: No results for input(s): WBC, HGB, HCT,  PLT, NA, K, CL, CO2, BUN, CREATININE, GLUCOSE, INR, CALCIUM in the last 72 hours.  Invalid input(s): PT, 2   Discharge Medications:   Allergies as of 04/06/2020      Reactions   Morphine Itching, Nausea And Vomiting   Penicillins Rash   Tolerated Cephalosporin Date: 04/05/20.        Medication List    STOP taking these medications   ibuprofen 200 MG tablet Commonly known as: ADVIL     TAKE these medications   albuterol 108 (90 Base) MCG/ACT inhaler Commonly known as: VENTOLIN HFA Inhale 2 puffs into the lungs every 6 (six) hours as needed for wheezing or shortness of breath.   aspirin 81 MG chewable tablet Commonly known as: Aspirin Childrens Chew 1 tablet (81 mg total) by mouth 2 (two) times daily. Take for 4 weeks, then resume regular dose.   baclofen 0.05 MG/ML injection Commonly known as: LIORESAL 1 mL (50 mcg total) by Intrathecal route once for 1 dose. In pain pump   Cholecalciferol 125 MCG (5000 UT) Tabs Take 5,000 Units by mouth daily.   cloNIDine 0.1 MG tablet Commonly known as: CATAPRES Take 1 tablet (0.1 mg total) by mouth 3 (three) times daily. In pain pump   docusate sodium 100 MG capsule Commonly known as: Colace Take 1 capsule (100 mg total) by mouth 2 (two) times daily.   ferrous sulfate 325 (65 FE) MG tablet Commonly known as: FerrouSul Take 1 tablet (325 mg total) by mouth 3 (  three) times daily with meals for 14 days.   HYDROcodone-acetaminophen 5-325 MG tablet Commonly known as: Norco Take 1-2 tablets by mouth every 4 (four) hours as needed for moderate pain or severe pain. What changed:   how much to take  when to take this  reasons to take this  additional instructions   HYDROmorphone in sodium chloride 0.9 % 100 mL Inject into the vein continuous. In pain pump   levothyroxine 112 MCG tablet Commonly known as: SYNTHROID Take 1 tablet (112 mcg total) by mouth daily.   metFORMIN 500 MG tablet Commonly known as: GLUCOPHAGE Take 1  tablet (500 mg total) by mouth daily with breakfast.   polyethylene glycol 17 g packet Commonly known as: MIRALAX / GLYCOLAX Take 17 g by mouth 2 (two) times daily.   triamterene-hydrochlorothiazide 37.5-25 MG capsule Commonly known as: DYAZIDE Take 1 each (1 capsule total) by mouth daily.            Discharge Care Instructions  (From admission, onward)         Start     Ordered   04/06/20 0000  Change dressing       Comments: Maintain surgical dressing until follow up in the clinic. If the edges start to pull up, may reinforce with tape. If the dressing is no longer working, may remove and cover with gauze and tape, but must keep the area dry and clean.  Call with any questions or concerns.   04/06/20 0834          Diagnostic Studies: DG Chest 2 View  Result Date: 03/16/2020 CLINICAL DATA:  Preop evaluation. Current smoker. Additional history provided by technologist: Patient reports current smoker, hypertension, COPD. EXAM: CHEST - 2 VIEW COMPARISON:  Prior chest radiographs 02/11/2018 and earlier. FINDINGS: Heart size within normal limits. No appreciable airspace consolidation. No evidence of pleural effusion or pneumothorax. No acute bony abnormality identified. IMPRESSION: No evidence of active cardiopulmonary disease. Electronically Signed   By: Jackey Loge DO   On: 03/16/2020 19:06   DG Pelvis Portable  Result Date: 04/05/2020 CLINICAL DATA:  Post RIGHT hip replacement EXAM: PORTABLE PELVIS 1-2 VIEWS COMPARISON:  Portable exam 0926 hours compared to 03/28/2008 FINDINGS: RIGHT hip prosthesis identified. Old healed RIGHT femoral diaphyseal fracture seen with cerclage wires. No acute fracture, dislocation, or bone destruction. Visualized pelvis intact with normal appearance of proximal LEFT femur. IMPRESSION: RIGHT hip prosthesis without acute complication. Old healed proximal RIGHT femoral diaphyseal fracture. Electronically Signed   By: Ulyses Southward M.D.   On: 04/05/2020 10:02     Disposition: Home  Discharge Instructions    Call MD / Call 911   Complete by: As directed    If you experience chest pain or shortness of breath, CALL 911 and be transported to the hospital emergency room.  If you develope a fever above 101 F, pus (white drainage) or increased drainage or redness at the wound, or calf pain, call your surgeon's office.   Change dressing   Complete by: As directed    Maintain surgical dressing until follow up in the clinic. If the edges start to pull up, may reinforce with tape. If the dressing is no longer working, may remove and cover with gauze and tape, but must keep the area dry and clean.  Call with any questions or concerns.   Constipation Prevention   Complete by: As directed    Drink plenty of fluids.  Prune juice may be helpful.  You may  use a stool softener, such as Colace (over the counter) 100 mg twice a day.  Use MiraLax (over the counter) for constipation as needed.   Diet - low sodium heart healthy   Complete by: As directed    Discharge instructions   Complete by: As directed    Maintain surgical dressing until follow up in the clinic. If the edges start to pull up, may reinforce with tape. If the dressing is no longer working, may remove and cover with gauze and tape, but must keep the area dry and clean.  Follow up in 2 weeks at [facility]. Call with any questions or concerns.   Follow the hip precautions as taught in Physical Therapy   Complete by: As directed    Posterior hip precautions.   Increase activity slowly as tolerated   Complete by: As directed    Weight bearing as tolerated with assist device (walker, cane, etc) as directed, use it as long as suggested by your surgeon or therapist, typically at least 4-6 weeks.   TED hose   Complete by: As directed    Use stockings (TED hose) for 2 weeks on both leg(s).  You may remove them at night for sleeping.       Follow-up Information    Durene Romans, MD. Schedule an  appointment as soon as possible for a visit in 2 weeks.   Specialty: Orthopedic Surgery Contact information: [address] [place] [address] [phone]                Signed: Genelle Gather [facility] 04/09/2020, 1:17 PM

## 2020-05-15 ENCOUNTER — Emergency Department: Payer: BC Managed Care – PPO

## 2020-05-15 ENCOUNTER — Other Ambulatory Visit: Payer: Self-pay

## 2020-05-15 ENCOUNTER — Emergency Department
Admission: EM | Admit: 2020-05-15 | Discharge: 2020-05-15 | Disposition: A | Discharge status: Home / Self Care | Payer: BC Managed Care – PPO | Attending: Student in an Organized Health Care Education/Training Program | Admitting: Student in an Organized Health Care Education/Training Program

## 2020-05-15 DIAGNOSIS — F1190 Opioid use, unspecified, uncomplicated: Secondary | ICD-10-CM | POA: Diagnosis not present

## 2020-05-15 DIAGNOSIS — M1610 Unilateral primary osteoarthritis, unspecified hip: Secondary | ICD-10-CM | POA: Diagnosis not present

## 2020-05-15 DIAGNOSIS — Z9889 Other specified postprocedural states: Secondary | ICD-10-CM | POA: Diagnosis not present

## 2020-05-15 DIAGNOSIS — T84020A Dislocation of internal right hip prosthesis, initial encounter: Secondary | ICD-10-CM | POA: Diagnosis not present

## 2020-05-15 DIAGNOSIS — E119 Type 2 diabetes mellitus without complications: Secondary | ICD-10-CM | POA: Diagnosis not present

## 2020-05-15 DIAGNOSIS — J449 Chronic obstructive pulmonary disease, unspecified: Secondary | ICD-10-CM | POA: Insufficient documentation

## 2020-05-15 DIAGNOSIS — W19XXXA Unspecified fall, initial encounter: Secondary | ICD-10-CM

## 2020-05-15 DIAGNOSIS — F17200 Nicotine dependence, unspecified, uncomplicated: Secondary | ICD-10-CM | POA: Diagnosis not present

## 2020-05-15 DIAGNOSIS — G894 Chronic pain syndrome: Secondary | ICD-10-CM | POA: Diagnosis not present

## 2020-05-15 DIAGNOSIS — S79911A Unspecified injury of right hip, initial encounter: Secondary | ICD-10-CM | POA: Diagnosis not present

## 2020-05-15 DIAGNOSIS — Z471 Aftercare following joint replacement surgery: Secondary | ICD-10-CM | POA: Diagnosis not present

## 2020-05-15 DIAGNOSIS — I10 Essential (primary) hypertension: Secondary | ICD-10-CM | POA: Diagnosis not present

## 2020-05-15 DIAGNOSIS — Z96641 Presence of right artificial hip joint: Secondary | ICD-10-CM | POA: Diagnosis not present

## 2020-05-15 DIAGNOSIS — Z7984 Long term (current) use of oral hypoglycemic drugs: Secondary | ICD-10-CM | POA: Diagnosis not present

## 2020-05-15 DIAGNOSIS — Z79899 Other long term (current) drug therapy: Secondary | ICD-10-CM | POA: Insufficient documentation

## 2020-05-15 DIAGNOSIS — S73005A Unspecified dislocation of left hip, initial encounter: Secondary | ICD-10-CM | POA: Diagnosis not present

## 2020-05-15 DIAGNOSIS — E039 Hypothyroidism, unspecified: Secondary | ICD-10-CM | POA: Diagnosis not present

## 2020-05-15 DIAGNOSIS — X501XXA Overexertion from prolonged static or awkward postures, initial encounter: Secondary | ICD-10-CM | POA: Insufficient documentation

## 2020-05-15 DIAGNOSIS — Z978 Presence of other specified devices: Secondary | ICD-10-CM | POA: Diagnosis not present

## 2020-05-15 DIAGNOSIS — S73004A Unspecified dislocation of right hip, initial encounter: Secondary | ICD-10-CM

## 2020-05-15 DIAGNOSIS — S73014A Posterior dislocation of right hip, initial encounter: Secondary | ICD-10-CM | POA: Diagnosis not present

## 2020-05-15 LAB — SAMPLE TO BLOOD BANK

## 2020-05-15 MED ORDER — KETAMINE HCL 10 MG/ML IJ SOLN
1.0000 mg/kg | Freq: Once | INTRAMUSCULAR | Status: DC
  Filled 2020-05-15: qty 1

## 2020-05-15 MED ORDER — KETAMINE HCL 10 MG/ML IJ SOLN
INTRAMUSCULAR | Status: AC | PRN
  Administered 2020-05-15: 15 mg via INTRAVENOUS
  Administered 2020-05-15: 35 mg via INTRAVENOUS

## 2020-05-15 MED ORDER — PROPOFOL 10 MG/ML IV BOLUS
INTRAVENOUS | Status: AC | PRN
  Administered 2020-05-15: 50 mg via INTRAVENOUS
  Administered 2020-05-15 (×2): 35 mg via INTRAVENOUS

## 2020-05-15 MED ORDER — PROPOFOL 10 MG/ML IV BOLUS
0.5000 mg/kg | Freq: Once | INTRAVENOUS | Status: DC
  Filled 2020-05-15: qty 20

## 2020-05-15 MED ORDER — HYDROMORPHONE HCL 1 MG/ML IJ SOLN
0.5000 mg | INTRAMUSCULAR | Status: DC | PRN
  Administered 2020-05-15: 0.5 mg via INTRAVENOUS
  Filled 2020-05-15: qty 1

## 2020-05-15 MED ORDER — ONDANSETRON HCL 4 MG/2ML IJ SOLN
4.0000 mg | Freq: Once | INTRAMUSCULAR | Status: AC
  Administered 2020-05-15: 4 mg via INTRAVENOUS
  Filled 2020-05-15: qty 2

## 2020-05-15 NOTE — ED Notes (Signed)
Pt tolerated ambulation. Pt to use home walker when discharged. Pt educated on ambulation as tolerated.
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
## 2020-05-15 NOTE — ED Notes (Signed)
Patient back from XRAY

## 2020-05-15 NOTE — ED Notes (Signed)
Patient to X-ray

## 2020-05-15 NOTE — ED Notes (Signed)
Pt unable to sign electronic consent for procedural sedation. Pt, witness, and Md Roxan Hockey signed paper consent. Paper consent placed in chart.
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
## 2020-05-15 NOTE — ED Notes (Signed)
Lab called, PINK blood bank tube hemolyzed. If patient will need to receive blood, it will need to be re-ordered and collected.

## 2020-05-15 NOTE — ED Notes (Signed)
Knee immobilizer placed immediately post hip reduction to right knee.

## 2020-05-21 DIAGNOSIS — T84020A Dislocation of internal right hip prosthesis, initial encounter: Secondary | ICD-10-CM | POA: Diagnosis not present

## 2020-05-21 DIAGNOSIS — Z471 Aftercare following joint replacement surgery: Secondary | ICD-10-CM | POA: Diagnosis not present

## 2020-05-21 DIAGNOSIS — Z96641 Presence of right artificial hip joint: Secondary | ICD-10-CM | POA: Diagnosis not present

## 2020-06-16 ENCOUNTER — Other Ambulatory Visit: Payer: Self-pay | Admitting: Physician Assistant

## 2020-06-16 DIAGNOSIS — I10 Essential (primary) hypertension: Secondary | ICD-10-CM

## 2020-06-27 DIAGNOSIS — G894 Chronic pain syndrome: Secondary | ICD-10-CM | POA: Diagnosis not present

## 2020-06-27 DIAGNOSIS — Z978 Presence of other specified devices: Secondary | ICD-10-CM | POA: Diagnosis not present

## 2020-06-27 DIAGNOSIS — F1190 Opioid use, unspecified, uncomplicated: Secondary | ICD-10-CM | POA: Diagnosis not present

## 2020-06-27 DIAGNOSIS — Z79899 Other long term (current) drug therapy: Secondary | ICD-10-CM | POA: Diagnosis not present

## 2020-06-27 DIAGNOSIS — M1610 Unilateral primary osteoarthritis, unspecified hip: Secondary | ICD-10-CM | POA: Diagnosis not present

## 2020-06-27 DIAGNOSIS — Z5181 Encounter for therapeutic drug level monitoring: Secondary | ICD-10-CM | POA: Diagnosis not present

## 2020-07-02 DIAGNOSIS — Z96641 Presence of right artificial hip joint: Secondary | ICD-10-CM | POA: Diagnosis not present

## 2020-07-02 DIAGNOSIS — Z471 Aftercare following joint replacement surgery: Secondary | ICD-10-CM | POA: Diagnosis not present

## 2020-08-08 DIAGNOSIS — M1610 Unilateral primary osteoarthritis, unspecified hip: Secondary | ICD-10-CM | POA: Diagnosis not present

## 2020-08-08 DIAGNOSIS — Z978 Presence of other specified devices: Secondary | ICD-10-CM | POA: Diagnosis not present

## 2020-08-08 DIAGNOSIS — F1190 Opioid use, unspecified, uncomplicated: Secondary | ICD-10-CM | POA: Diagnosis not present

## 2020-08-08 DIAGNOSIS — G894 Chronic pain syndrome: Secondary | ICD-10-CM | POA: Diagnosis not present

## 2020-09-18 DIAGNOSIS — G894 Chronic pain syndrome: Secondary | ICD-10-CM | POA: Diagnosis not present

## 2020-09-18 DIAGNOSIS — M1610 Unilateral primary osteoarthritis, unspecified hip: Secondary | ICD-10-CM | POA: Diagnosis not present

## 2020-10-30 DIAGNOSIS — M1610 Unilateral primary osteoarthritis, unspecified hip: Secondary | ICD-10-CM | POA: Diagnosis not present

## 2020-10-30 DIAGNOSIS — G894 Chronic pain syndrome: Secondary | ICD-10-CM | POA: Diagnosis not present

## 2020-10-30 DIAGNOSIS — Z978 Presence of other specified devices: Secondary | ICD-10-CM | POA: Diagnosis not present

## 2020-10-30 DIAGNOSIS — F419 Anxiety disorder, unspecified: Secondary | ICD-10-CM | POA: Diagnosis not present

## 2020-12-12 ENCOUNTER — Telehealth: Payer: Self-pay

## 2020-12-12 DIAGNOSIS — E119 Type 2 diabetes mellitus without complications: Secondary | ICD-10-CM

## 2020-12-12 DIAGNOSIS — E039 Hypothyroidism, unspecified: Secondary | ICD-10-CM

## 2020-12-12 DIAGNOSIS — I10 Essential (primary) hypertension: Secondary | ICD-10-CM

## 2020-12-12 DIAGNOSIS — E559 Vitamin D deficiency, unspecified: Secondary | ICD-10-CM

## 2020-12-12 DIAGNOSIS — E7800 Pure hypercholesterolemia, unspecified: Secondary | ICD-10-CM

## 2020-12-12 DIAGNOSIS — D751 Secondary polycythemia: Secondary | ICD-10-CM

## 2020-12-12 DIAGNOSIS — Z1159 Encounter for screening for other viral diseases: Secondary | ICD-10-CM

## 2020-12-12 NOTE — Telephone Encounter (Signed)
Copied from CRM [id number]. Topic: General - Other >> Dec 12, 2020  2:58 PM Traci Sermon wrote: Reason for CRM: Pt called in wanting to get labs ordered for her physical on 11/29, pt requested a call back to get that scheduled, please advise.

## 2020-12-13 NOTE — Telephone Encounter (Signed)
Patient advised.

## 2020-12-13 NOTE — Telephone Encounter (Signed)
Needs A1c, CBC, CMP, lipid, TSH. Hep C and HIV screening if they haven't been done. OK to order and leave slip to have them drawn fasting at least 2 days before appt.

## 2020-12-25 DIAGNOSIS — Z451 Encounter for adjustment and management of infusion pump: Secondary | ICD-10-CM | POA: Diagnosis not present

## 2020-12-25 DIAGNOSIS — F419 Anxiety disorder, unspecified: Secondary | ICD-10-CM | POA: Diagnosis not present

## 2020-12-25 DIAGNOSIS — M1610 Unilateral primary osteoarthritis, unspecified hip: Secondary | ICD-10-CM | POA: Diagnosis not present

## 2020-12-25 DIAGNOSIS — G894 Chronic pain syndrome: Secondary | ICD-10-CM | POA: Diagnosis not present

## 2020-12-31 LAB — HM DIABETES EYE EXAM

## 2021-01-01 DIAGNOSIS — E119 Type 2 diabetes mellitus without complications: Secondary | ICD-10-CM | POA: Diagnosis not present

## 2021-01-01 DIAGNOSIS — E039 Hypothyroidism, unspecified: Secondary | ICD-10-CM | POA: Diagnosis not present

## 2021-01-01 DIAGNOSIS — D751 Secondary polycythemia: Secondary | ICD-10-CM | POA: Diagnosis not present

## 2021-01-01 DIAGNOSIS — E559 Vitamin D deficiency, unspecified: Secondary | ICD-10-CM | POA: Diagnosis not present

## 2021-01-01 DIAGNOSIS — I10 Essential (primary) hypertension: Secondary | ICD-10-CM | POA: Diagnosis not present

## 2021-01-01 DIAGNOSIS — E7800 Pure hypercholesterolemia, unspecified: Secondary | ICD-10-CM | POA: Diagnosis not present

## 2021-01-01 DIAGNOSIS — Z1159 Encounter for screening for other viral diseases: Secondary | ICD-10-CM | POA: Diagnosis not present

## 2021-01-04 LAB — COMPREHENSIVE METABOLIC PANEL
ALT: 28 IU/L (ref 0–32)
AST: 25 IU/L (ref 0–40)
Albumin/Globulin Ratio: 2.5 — ABNORMAL HIGH (ref 1.2–2.2)
Albumin: 4.8 g/dL (ref 3.8–4.9)
Alkaline Phosphatase: 163 IU/L — ABNORMAL HIGH (ref 44–121)
BUN/Creatinine Ratio: 11 (ref 9–23)
BUN: 12 mg/dL (ref 6–24)
Bilirubin Total: 0.5 mg/dL (ref 0.0–1.2)
CO2: 25 mmol/L (ref 20–29)
Calcium: 9.8 mg/dL (ref 8.7–10.2)
Chloride: 99 mmol/L (ref 96–106)
Creatinine, Ser: 1.07 mg/dL — ABNORMAL HIGH (ref 0.57–1.00)
Globulin, Total: 1.9 g/dL (ref 1.5–4.5)
Glucose: 366 mg/dL — ABNORMAL HIGH (ref 70–99)
Potassium: 4.6 mmol/L (ref 3.5–5.2)
Sodium: 139 mmol/L (ref 134–144)
Total Protein: 6.7 g/dL (ref 6.0–8.5)
eGFR: 61 mL/min/{1.73_m2} (ref >59)

## 2021-01-04 LAB — CBC WITH DIFFERENTIAL/PLATELET
Basophils Absolute: 0.1 10*3/uL (ref 0.0–0.2)
Basos: 1 %
EOS (ABSOLUTE): 0.1 10*3/uL (ref 0.0–0.4)
Eos: 1 %
Hematocrit: 50.5 % — ABNORMAL HIGH (ref 34.0–46.6)
Hemoglobin: 17 g/dL — ABNORMAL HIGH (ref 11.1–15.9)
Immature Grans (Abs): 0 10*3/uL (ref 0.0–0.1)
Immature Granulocytes: 0 %
Lymphocytes Absolute: 1.8 10*3/uL (ref 0.7–3.1)
Lymphs: 24 %
MCH: 30.5 pg (ref 26.6–33.0)
MCHC: 33.7 g/dL (ref 31.5–35.7)
MCV: 91 fL (ref 79–97)
Monocytes Absolute: 0.5 10*3/uL (ref 0.1–0.9)
Monocytes: 7 %
Neutrophils Absolute: 5.2 10*3/uL (ref 1.4–7.0)
Neutrophils: 67 %
Platelets: 188 10*3/uL (ref 150–450)
RBC: 5.57 x10E6/uL — ABNORMAL HIGH (ref 3.77–5.28)
RDW: 12.8 % (ref 11.7–15.4)
WBC: 7.7 10*3/uL (ref 3.4–10.8)

## 2021-01-04 LAB — LIPID PANEL WITH LDL/HDL RATIO
Cholesterol, Total: 169 mg/dL (ref 100–199)
HDL: 44 mg/dL (ref >39)
LDL Chol Calc (NIH): 110 mg/dL — ABNORMAL HIGH (ref 0–99)
LDL/HDL Ratio: 2.5 ratio (ref 0.0–3.2)
Triglycerides: 82 mg/dL (ref 0–149)
VLDL Cholesterol Cal: 15 mg/dL (ref 5–40)

## 2021-01-04 LAB — VITAMIN D 25 HYDROXY (VIT D DEFICIENCY, FRACTURES): Vit D, 25-Hydroxy: 47.4 ng/mL (ref 30.0–100.0)

## 2021-01-04 LAB — HEPATITIS C ANTIBODY: Hep C Virus Ab: <0.1 s/co ratio (ref 0.0–0.9)

## 2021-01-04 LAB — TSH: TSH: 9.77 u[IU]/mL — ABNORMAL HIGH (ref 0.450–4.500)

## 2021-01-04 LAB — HEMOGLOBIN A1C
Est. average glucose Bld gHb Est-mCnc: >398 mg/dL
Hgb A1c MFr Bld: >15.5 % — ABNORMAL HIGH (ref 4.8–5.6)

## 2021-01-07 NOTE — Progress Notes (Signed)
Will discuss at visit tomorrow. Please see if we can add on a free T4.

## 2021-01-08 ENCOUNTER — Encounter: Payer: Self-pay | Admitting: Family Medicine

## 2021-01-08 ENCOUNTER — Other Ambulatory Visit: Payer: Self-pay

## 2021-01-08 ENCOUNTER — Ambulatory Visit (INDEPENDENT_AMBULATORY_CARE_PROVIDER_SITE_OTHER): Payer: BC Managed Care – PPO | Admitting: Family Medicine

## 2021-01-08 VITALS — BP 116/76 | HR 98 | Temp 97.6°F | Resp 16 | Ht 64.0 in | Wt 119.9 lb

## 2021-01-08 DIAGNOSIS — D751 Secondary polycythemia: Secondary | ICD-10-CM

## 2021-01-08 DIAGNOSIS — E1159 Type 2 diabetes mellitus with other circulatory complications: Secondary | ICD-10-CM | POA: Diagnosis not present

## 2021-01-08 DIAGNOSIS — E1169 Type 2 diabetes mellitus with other specified complication: Secondary | ICD-10-CM | POA: Diagnosis not present

## 2021-01-08 DIAGNOSIS — J449 Chronic obstructive pulmonary disease, unspecified: Secondary | ICD-10-CM

## 2021-01-08 DIAGNOSIS — Z0000 Encounter for general adult medical examination without abnormal findings: Secondary | ICD-10-CM | POA: Diagnosis not present

## 2021-01-08 DIAGNOSIS — L301 Dyshidrosis [pompholyx]: Secondary | ICD-10-CM

## 2021-01-08 DIAGNOSIS — E785 Hyperlipidemia, unspecified: Secondary | ICD-10-CM

## 2021-01-08 DIAGNOSIS — I152 Hypertension secondary to endocrine disorders: Secondary | ICD-10-CM

## 2021-01-08 DIAGNOSIS — F17200 Nicotine dependence, unspecified, uncomplicated: Secondary | ICD-10-CM

## 2021-01-08 DIAGNOSIS — E1165 Type 2 diabetes mellitus with hyperglycemia: Secondary | ICD-10-CM | POA: Diagnosis not present

## 2021-01-08 DIAGNOSIS — Z23 Encounter for immunization: Secondary | ICD-10-CM

## 2021-01-08 DIAGNOSIS — E039 Hypothyroidism, unspecified: Secondary | ICD-10-CM

## 2021-01-08 DIAGNOSIS — Z1231 Encounter for screening mammogram for malignant neoplasm of breast: Secondary | ICD-10-CM | POA: Diagnosis not present

## 2021-01-08 DIAGNOSIS — R748 Abnormal levels of other serum enzymes: Secondary | ICD-10-CM | POA: Insufficient documentation

## 2021-01-08 DIAGNOSIS — E038 Other specified hypothyroidism: Secondary | ICD-10-CM | POA: Insufficient documentation

## 2021-01-08 MED ORDER — LEVOTHYROXINE SODIUM 125 MCG PO TABS: 125.0000 ug | ORAL_TABLET | Freq: Every day | ORAL | 1 refills | Status: DC

## 2021-01-08 MED ORDER — EMPAGLIFLOZIN 25 MG PO TABS: 25.0000 mg | ORAL_TABLET | Freq: Every day | ORAL | 5 refills | Status: DC

## 2021-01-08 MED ORDER — METFORMIN HCL 500 MG PO TABS: 500.0000 mg | ORAL_TABLET | Freq: Two times a day (BID) | ORAL | 1 refills | Status: DC

## 2021-01-08 NOTE — Patient Instructions (Signed)
Think about a prevnar 20 (pneumonia shot) at next visit

## 2021-01-08 NOTE — Assessment & Plan Note (Signed)
Chronic and stable No changes Encourage smoking cessation

## 2021-01-08 NOTE — Assessment & Plan Note (Signed)
Well controlled Continue current medications Recheck metabolic panel F/u in 6 months  

## 2021-01-08 NOTE — Assessment & Plan Note (Signed)
Reviewed last lipid panel - LDL above goal Not currently on a statin - can discuss at next visit - hesitant currently Discussed diet and exercise

## 2021-01-08 NOTE — Assessment & Plan Note (Addendum)
Chronic and very uncontrolled with significant hyperglycemia Resume metformin at 500mg  BID Start Jardiance at 25mg  daily Encourage diet and exercise Encourage Optho appt Urine micro/Cr ratio today

## 2021-01-08 NOTE — Assessment & Plan Note (Signed)
Reviewed last CBC Hgb stable

## 2021-01-08 NOTE — Assessment & Plan Note (Signed)
May be related to significant hyperglycemia Recheck at next visit

## 2021-01-08 NOTE — Progress Notes (Signed)
Complete physical exam   [name]   DOB: 1965/09/22   55 y.o. Female  MRN: [id number] Visit Date: 01/08/2021  Today's healthcare provider: Lavon Paganini, MD   Chief Complaint  Patient presents with   Annual Exam   Subjective    Tasha Nichols is a 55 y.o. female who presents today for a complete physical exam.  She reports consuming a general diet. The patient has a physically strenuous job, but has no regular exercise apart from work.  She generally feels well. She reports sleeping fairly well. She does not have additional problems to discuss today.  HPI    Past Medical History:  Diagnosis Date   Arthritis    Chronic pain    COPD (chronic obstructive pulmonary disease) ([place])    Diabetes mellitus without complication ([place])    Dyspnea    GERD (gastroesophageal reflux disease)    Hypertension    Hypothyroidism    Pneumonia    Thyroid disease    Past Surgical History:  Procedure Laterality Date   ABDOMINAL HYSTERECTOMY  2001   CESAREAN SECTION  1994   JOINT REPLACEMENT Right 2005, 2010   hip   PAIN PUMP IMPLANTATION  2008, 2014   Dr Dina Rich   TONSILLECTOMY  1970   TOTAL HIP REVISION Right 04/05/2020   Procedure: Revision Right total hip arthroplasty conversion of metal to ceramic with debridement;  Surgeon: Paralee Cancel, MD;  Location: [facility];  Service: Orthopedics;  Laterality: Right;  90 mins   Social History   Socioeconomic History   Marital status: Married    Spouse name: Not on file   Number of children: Not on file   Years of education: Not on file   Highest education level: Not on file  Occupational History   Not on file  Tobacco Use   Smoking status: Some Days    Packs/day: 0.25    Types: Cigarettes   Smokeless tobacco: Never  Vaping Use   Vaping Use: Never used  Substance and Sexual Activity   Alcohol use: No   Drug use: No   Sexual activity: Not Currently  Other Topics Concern   Not on file  Social History Narrative    Not on file   Social Determinants of Health   Financial Resource Strain: Not on file  Food Insecurity: Not on file  Transportation Needs: Not on file  Physical Activity: Not on file  Stress: Not on file  Social Connections: Not on file  Intimate Partner Violence: Not on file   Family Status  Relation Name Status   Mother  Alive   Father  Deceased   Neg Hx  (Not Specified)   Family History  Problem Relation Age of Onset   Diabetes Father    Breast cancer Neg Hx    Allergies  Allergen Reactions   Morphine Itching and Nausea And Vomiting   Penicillins Rash    Tolerated Cephalosporin Date: 04/05/20.      Patient Care Team: Mikey Kirschner, PA-C as PCP - General (Physician Assistant) Christene Lye, MD (General Surgery)   Medications: Outpatient Medications Prior to Visit  Medication Sig   albuterol (VENTOLIN HFA) 108 (90 Base) MCG/ACT inhaler Inhale 2 puffs into the lungs every 6 (six) hours as needed for wheezing or shortness of breath.   baclofen (LIORESAL) 0.05 MG/ML injection 1 mL (50 mcg total) by Intrathecal route once for 1 dose. In pain pump   Cholecalciferol 125 MCG (5000  UT) TABS Take 5,000 Units by mouth daily.   cloNIDine (CATAPRES) 0.1 MG tablet Take 1 tablet (0.1 mg total) by mouth 3 (three) times daily. In pain pump   HYDROcodone-acetaminophen (NORCO) 5-325 MG tablet Take 1-2 tablets by mouth every 4 (four) hours as needed for moderate pain or severe pain.   HYDROmorphone in sodium chloride 0.9 % 100 mL Inject into the vein continuous. In pain pump   triamterene-hydrochlorothiazide (DYAZIDE) 37.5-25 MG capsule Take 1 capsule by mouth once daily (Patient taking differently: Does not take daily)   [DISCONTINUED] levothyroxine (SYNTHROID) 112 MCG tablet Take 1 tablet (112 mcg total) by mouth daily.   [DISCONTINUED] docusate sodium (COLACE) 100 MG capsule Take 1 capsule (100 mg total) by mouth 2 (two) times daily.   [DISCONTINUED] ferrous sulfate  (FERROUSUL) 325 (65 FE) MG tablet Take 1 tablet (325 mg total) by mouth 3 (three) times daily with meals for 14 days.   [DISCONTINUED] metFORMIN (GLUCOPHAGE) 500 MG tablet Take 1 tablet (500 mg total) by mouth daily with breakfast.   [DISCONTINUED] polyethylene glycol (MIRALAX / GLYCOLAX) 17 g packet Take 17 g by mouth 2 (two) times daily.   No facility-administered medications prior to visit.    Review of Systems  Constitutional:  Positive for appetite change.  HENT: Negative.    Eyes:  Positive for visual disturbance.  Respiratory: Negative.    Cardiovascular: Negative.   Gastrointestinal: Negative.   Endocrine: Negative.   Genitourinary: Negative.   Musculoskeletal:  Positive for arthralgias, joint swelling and myalgias.  Skin: Negative.   Allergic/Immunologic: Negative.   Neurological: Negative.   Hematological: Negative.   Psychiatric/Behavioral: Negative.     Last CBC Lab Results  Component Value Date   WBC 7.7 01/01/2021   HGB 17.0 (H) 01/01/2021   HCT 50.5 (H) 01/01/2021   MCV 91 01/01/2021   MCH 30.5 01/01/2021   RDW 12.8 01/01/2021   PLT 188 96/22/2979   Last metabolic panel Lab Results  Component Value Date   GLUCOSE 366 (H) 01/01/2021   NA 139 01/01/2021   K 4.6 01/01/2021   CL 99 01/01/2021   CO2 25 01/01/2021   BUN 12 01/01/2021   CREATININE 1.07 (H) 01/01/2021   EGFR 61 01/01/2021   CALCIUM 9.8 01/01/2021   PROT 6.7 01/01/2021   ALBUMIN 4.8 01/01/2021   LABGLOB 1.9 01/01/2021   AGRATIO 2.5 (H) 01/01/2021   BILITOT 0.5 01/01/2021   ALKPHOS 163 (H) 01/01/2021   AST 25 01/01/2021   ALT 28 01/01/2021   ANIONGAP 9 04/06/2020   Last lipids Lab Results  Component Value Date   CHOL 169 01/01/2021   HDL 44 01/01/2021   LDLCALC 110 (H) 01/01/2021   TRIG 82 01/01/2021   CHOLHDL 4.9 (H) 10/06/2017   Last hemoglobin A1c Lab Results  Component Value Date   HGBA1C >15.5 (H) 01/01/2021   Last thyroid functions Lab Results  Component Value Date    TSH 9.770 (H) 01/01/2021   T4TOTAL 7.0 03/11/2019      Objective    BP 116/76 (BP Location: Left Arm, Patient Position: Sitting, Cuff Size: Large)   Pulse 98   Temp 97.6 F (36.4 C) (Temporal)   Resp 16   Ht 5' 4" (1.626 m)   Wt 119 lb 14.4 oz (54.4 kg)   SpO2 95%   BMI 20.58 kg/m  BP Readings from Last 3 Encounters:  01/08/21 116/76  05/15/20 112/82  04/06/20 120/71   Wt Readings from Last 3 Encounters:  01/08/21 119 lb 14.4 oz (54.4 kg)  05/15/20 145 lb (65.8 kg)  04/05/20 145 lb (65.8 kg)      Physical Exam Vitals reviewed.  Constitutional:      General: She is not in acute distress.    Appearance: Normal appearance. She is well-developed. She is not diaphoretic.  HENT:     Head: Normocephalic and atraumatic.  Eyes:     General: No scleral icterus.    Conjunctiva/sclera: Conjunctivae normal.  Neck:     Thyroid: No thyromegaly.  Cardiovascular:     Rate and Rhythm: Normal rate and regular rhythm.     Pulses: Normal pulses.     Heart sounds: Normal heart sounds. No murmur heard. Pulmonary:     Effort: Pulmonary effort is normal. No respiratory distress.     Breath sounds: Normal breath sounds. No wheezing, rhonchi or rales.  Musculoskeletal:     Cervical back: Neck supple.     Right lower leg: No edema.     Left lower leg: No edema.  Lymphadenopathy:     Cervical: No cervical adenopathy.  Skin:    General: Skin is warm and dry.     Findings: No rash.  Neurological:     Mental Status: She is alert and oriented to person, place, and time. Mental status is at baseline.  Psychiatric:        Mood and Affect: Mood normal.        Behavior: Behavior normal.      Last depression screening scores PHQ 2/9 Scores 01/08/2021 03/16/2020 10/06/2017  PHQ - 2 Score 0 0 2  PHQ- 9 Score _0 Last fall risk screening Fall Risk  01/08/2021  Falls in the past year? 0  Number falls in past yr: 0  Injury with Fall? 0  Risk for fall due to : No Fall Risks  Follow up  Falls evaluation completed   Last Audit-C alcohol use screening Alcohol Use Disorder Test (AUDIT) 01/08/2021  1. How often do you have a drink containing alcohol? 0  2. How many drinks containing alcohol do you have on a typical day when you are drinking? 0  3. How often do you have six or more drinks on one occasion? 0  AUDIT-C Score 0  Alcohol Brief Interventions/Follow-up -   A score of 3 or more in women, and 4 or more in men indicates increased risk for alcohol abuse, EXCEPT if all of the points are from question 1   No results found for any visits on 01/08/21.  Assessment & Plan    Routine Health Maintenance and Physical Exam  Exercise Activities and Dietary recommendations  Goals   None     Immunization History  Administered Date(s) Administered   PFIZER(Purple Top)SARS-COV-2 Vaccination 05/17/2019, 06/07/2019   Pfizer Covid-19 Vaccine Bivalent Booster 5y-11y 02/15/2020   Td 10/16/2010   Tdap 10/16/2010    Health Maintenance  Topic Date Due   Pneumococcal Vaccine 50-70 Years old (1 - PCV) Never done   OPHTHALMOLOGY EXAM  Never done   Zoster Vaccines- Shingrix (1 of 2) Never done   PAP SMEAR-Modifier  Never done   COVID-19 Vaccine (4 - Booster for Pfizer series) 04/11/2020   MAMMOGRAM  07/07/2020   TETANUS/TDAP  10/15/2020   INFLUENZA VACCINE  05/10/2021 (Originally 09/10/2020)   FOOT EXAM  03/23/2021   HEMOGLOBIN A1C  07/01/2021   COLONOSCOPY (Pts 45-32yr Insurance coverage will need to be confirmed)  07/12/2023   Hepatitis  C Screening  Completed   HIV Screening  Completed   HPV VACCINES  Aged Out    Discussed health benefits of physical activity, and encouraged her to engage in regular exercise appropriate for her age and condition.  Problem List Items Addressed This Visit       Cardiovascular and Mediastinum   Hypertension associated with diabetes ([place])    Well controlled Continue current medications Recheck metabolic panel F/u in 6 months        Relevant Medications   empagliflozin (JARDIANCE) 25 MG TABS tablet   metFORMIN (GLUCOPHAGE) 500 MG tablet     Respiratory   COPD (chronic obstructive pulmonary disease) ([facility])    Chronic and stable No changes Encourage smoking cessation        Endocrine   Acquired hypothyroidism    Chronic and uncontrolled High TSH Increase synthroid to 125 mcg daily Recheck TSH in 2 months      Relevant Medications   levothyroxine (SYNTHROID) 125 MCG tablet   Hyperlipidemia associated with type 2 diabetes mellitus ([place])    Reviewed last lipid panel - LDL above goal Not currently on a statin - can discuss at next visit - hesitant currently Discussed diet and exercise       Relevant Medications   empagliflozin (JARDIANCE) 25 MG TABS tablet   metFORMIN (GLUCOPHAGE) 500 MG tablet   Type 2 diabetes mellitus with hyperglycemia, without long-term current use of insulin ([facility])    Chronic and very uncontrolled with significant hyperglycemia Resume metformin at 565m BID Start Jardiance at 221mdaily Encourage diet and exercise Encourage Optho appt Urine micro/Cr ratio today      Relevant Medications   empagliflozin (JARDIANCE) 25 MG TABS tablet   metFORMIN (GLUCOPHAGE) 500 MG tablet   Other Relevant Orders   Urine Microalbumin w/creat. ratio     Musculoskeletal and Integument   Dyshidrotic eczema    Encourage unscented hand cream Consider topical steroids        Other   Acquired polycythemia    Reviewed last CBC Hgb stable      Compulsive tobacco user syndrome    3-5 minute discussion regarding the harms of tobacco use, the benefits of cessation, and methods of cessation Discussed that there are medication options to help with cessation Patient is currently precontemplative - she is smoking only when stressed currently Will reassess at next visit       Alkaline phosphatase elevation    May be related to significant hyperglycemia Recheck at next visit      Other Visit  Diagnoses     Encounter for annual physical exam    -  Primary   Encounter for screening mammogram for malignant neoplasm of breast       Relevant Orders   MM 3D SCREEN BREAST BILATERAL   Need for Tdap vaccination            Return in about 3 months (around 04/09/2021) for chronic disease f/u, With new PCP.     Total time spent on today's visit was greater than 40 minutes outside of the physical, including both face-to-face time and nonface-to-face time personally spent on review of chart (labs and imaging), discussing labs and goals, discussing further work-up, treatment options, referrals to specialist if needed, reviewing outside records of pertinent, answering patient's questions, and coordinating care.    I, AnLavon PaganiniMD, have reviewed all documentation for this visit. The documentation on 01/08/21 for the exam, diagnosis, procedures, and orders are all accurate  and complete.   Dozier Berkovich, Dionne Bucy, MD, MPH [facility]

## 2021-01-08 NOTE — Assessment & Plan Note (Signed)
Chronic and uncontrolled High TSH Increase synthroid to 125 mcg daily Recheck TSH in 2 months

## 2021-01-08 NOTE — Assessment & Plan Note (Signed)
Encourage unscented hand cream Consider topical steroids

## 2021-01-08 NOTE — Addendum Note (Signed)
Addended by: Hyacinth Meeker on: 01/08/2021 11:20 AM   Modules accepted: Orders

## 2021-01-08 NOTE — Assessment & Plan Note (Signed)
3-5 minute discussion regarding the harms of tobacco use, the benefits of cessation, and methods of cessation Discussed that there are medication options to help with cessation Patient is currently precontemplative - she is smoking only when stressed currently Will reassess at next visit

## 2021-01-09 LAB — MICROALBUMIN / CREATININE URINE RATIO
Creatinine, Urine: 66.8 mg/dL
Microalb/Creat Ratio: <4 mg/g creat (ref 0–29)
Microalbumin, Urine: <3 ug/mL

## 2021-01-11 LAB — T4, FREE: Free T4: 1.63 ng/dL (ref 0.82–1.77)

## 2021-01-11 LAB — SPECIMEN STATUS REPORT

## 2021-01-23 ENCOUNTER — Encounter: Payer: Self-pay | Admitting: Physician Assistant

## 2021-02-19 DIAGNOSIS — G894 Chronic pain syndrome: Secondary | ICD-10-CM | POA: Diagnosis not present

## 2021-02-19 DIAGNOSIS — Z978 Presence of other specified devices: Secondary | ICD-10-CM | POA: Diagnosis not present

## 2021-02-19 DIAGNOSIS — M1611 Unilateral primary osteoarthritis, right hip: Secondary | ICD-10-CM | POA: Diagnosis not present

## 2021-02-19 DIAGNOSIS — Z5181 Encounter for therapeutic drug level monitoring: Secondary | ICD-10-CM | POA: Diagnosis not present

## 2021-02-19 DIAGNOSIS — Z79899 Other long term (current) drug therapy: Secondary | ICD-10-CM | POA: Diagnosis not present

## 2021-02-19 DIAGNOSIS — F419 Anxiety disorder, unspecified: Secondary | ICD-10-CM | POA: Diagnosis not present

## 2021-03-09 ENCOUNTER — Other Ambulatory Visit: Payer: Self-pay | Admitting: Family Medicine

## 2021-03-10 NOTE — Telephone Encounter (Signed)
Appt- 04/09/21 Requested Prescriptions  Pending Prescriptions Disp Refills   metFORMIN (GLUCOPHAGE) 500 MG tablet [Pharmacy Med Name: metFORMIN HCl 500 MG Oral Tablet] 60 tablet 0    Sig: TAKE 1 TABLET BY MOUTH TWICE DAILY WITH A MEAL     Endocrinology:  Diabetes - Biguanides Failed - 03/09/2021  7:07 PM      Failed - Cr in normal range and within 360 days    Creatinine, Ser  Date Value Ref Range Status  01/01/2021 1.07 (H) 0.57 - 1.00 mg/dL Final         Failed - HBA1C is between 0 and 7.9 and within 180 days    Hgb A1c MFr Bld  Date Value Ref Range Status  01/01/2021 >15.5 (H) 4.8 - 5.6 % Final    Comment:             Prediabetes: 5.7 - 6.4          Diabetes: >6.4          Glycemic control for adults with diabetes: <7.0          Passed - eGFR in normal range and within 360 days    GFR calc Af Amer  Date Value Ref Range Status  03/16/2020 59 (L) >59 mL/min/1.73 Final    Comment:    **In accordance with recommendations from the NKF-ASN Task force,**   [facility] is in the process of updating its eGFR calculation to the   2021 CKD-EPI creatinine equation that estimates kidney function   without a race variable.    GFR, Estimated  Date Value Ref Range Status  04/06/2020 51 (L) >60 mL/min Final    Comment:    (NOTE) Calculated using the CKD-EPI Creatinine Equation (2021)    eGFR  Date Value Ref Range Status  01/01/2021 61 >59 mL/min/1.73 Final         Passed - Valid encounter within last 6 months    Recent Outpatient Visits          2 months ago Encounter for annual physical exam   [facility] Virginia Crews, MD   11 months ago New onset type 2 diabetes mellitus [facility])   [facility] Fenton Malling M, [place]   11 months ago Pre-op evaluation   [place], Clearnce Sorrel, [place]   2 years ago Essential (primary) hypertension   [place], [place], [place]   3 years ago Acute respiratory  failure with hypoxia [facility])   [place], Clearnce Sorrel, [place]      Future Appointments            In 1 month Rollene Rotunda, Jaci Standard, [place], [place]

## 2021-04-09 ENCOUNTER — Ambulatory Visit: Payer: BC Managed Care – PPO | Admitting: Family Medicine

## 2021-04-11 NOTE — Progress Notes (Signed)
Tasha Nichols,acting as a scribe for Tasha Sprout, FNP.,have documented all relevant documentation on the behalf of Tasha Sprout, FNP,as directed by  Tasha Sprout, FNP while in the presence of Tasha Sprout, FNP.   Established patient visit   [name]   DOB: 1965/02/12   56 y.o. Female  MRN: [id number] Visit Date: 04/12/2021  Today's healthcare provider: Gwyneth Sprout, FNP  Introduced to nurse practitioner role and practice setting.  All questions answered.  Discussed provider/patient relationship and expectations.   No chief complaint on file.  Subjective    HPI  Hypertension, follow-up  BP Readings from Last 3 Encounters:  04/12/21 122/80  01/08/21 116/76  05/15/20 112/82   Wt Readings from Last 3 Encounters:  04/12/21 115 lb (52.2 kg)  01/08/21 119 lb 14.4 oz (54.4 kg)  05/15/20 145 lb (65.8 kg)     She was last seen for hypertension 3 months ago.  BP at that visit was as above. Management since that visit includes none.  She reports good compliance with treatment. She is not having side effects.   Use of agents associated with hypertension: none.    Symptoms: No chest pain No chest pressure  No palpitations No syncope  No dyspnea No orthopnea  No paroxysmal nocturnal dyspnea No lower extremity edema   Pertinent labs: Lab Results  Component Value Date   CHOL 169 01/01/2021   HDL 44 01/01/2021   LDLCALC 110 (H) 01/01/2021   TRIG 82 01/01/2021   CHOLHDL 4.9 (H) 10/06/2017   Lab Results  Component Value Date   NA 139 01/01/2021   K 4.6 01/01/2021   CREATININE 1.07 (H) 01/01/2021   EGFR 61 01/01/2021   GLUCOSE 366 (H) 01/01/2021   TSH 9.770 (H) 01/01/2021     The 10-year ASCVD risk score (Arnett DK, et al., 2019) is: 13%   --------------------------------------------------------------------------------------------------- Diabetes Mellitus Type II, Follow-up  Lab Results  Component Value Date   HGBA1C >15.5 (H) 01/01/2021    HGBA1C 7.7 (A) 03/23/2020   HGBA1C 8.1 (H) 03/16/2020   Wt Readings from Last 3 Encounters:  04/12/21 115 lb (52.2 kg)  01/08/21 119 lb 14.4 oz (54.4 kg)  05/15/20 145 lb (65.8 kg)   Last seen for diabetes 3 months ago.  Management since then includes resuming Metformin and starting Jardiance She reports good compliance with treatment. She is not having side effects.  Symptoms: No fatigue No foot ulcerations  No appetite changes No nausea  No paresthesia of the feet  No polydipsia  No polyuria No visual disturbances   No vomiting      Pertinent Labs: Lab Results  Component Value Date   CHOL 169 01/01/2021   HDL 44 01/01/2021   LDLCALC 110 (H) 01/01/2021   TRIG 82 01/01/2021   CHOLHDL 4.9 (H) 10/06/2017   Lab Results  Component Value Date   NA 139 01/01/2021   K 4.6 01/01/2021   CREATININE 1.07 (H) 01/01/2021   EGFR 61 01/01/2021   LABMICR <3.0 01/08/2021     ---------------------------------------------------------------------------------------------------   Medications: Outpatient Medications Prior to Visit  Medication Sig   albuterol (VENTOLIN HFA) 108 (90 Base) MCG/ACT inhaler Inhale 2 puffs into the lungs every 6 (six) hours as needed for wheezing or shortness of breath.   baclofen (LIORESAL) 0.05 MG/ML injection 1 mL (50 mcg total) by Intrathecal route once for 1 dose. In pain pump   Cholecalciferol 125 MCG (5000 UT) TABS  Take 5,000 Units by mouth daily.   cloNIDine (CATAPRES) 0.1 MG tablet Take 1 tablet (0.1 mg total) by mouth 3 (three) times daily. In pain pump   empagliflozin (JARDIANCE) 25 MG TABS tablet Take 1 tablet (25 mg total) by mouth daily before breakfast.   HYDROcodone-acetaminophen (NORCO) 5-325 MG tablet Take 1-2 tablets by mouth every 4 (four) hours as needed for moderate pain or severe pain.   HYDROmorphone in sodium chloride 0.9 % 100 mL Inject into the vein continuous. In pain pump   levothyroxine (SYNTHROID) 125 MCG tablet Take 1 tablet  (125 mcg total) by mouth daily.   triamterene-hydrochlorothiazide (DYAZIDE) 37.5-25 MG capsule Take 1 capsule by mouth once daily (Patient taking differently: Does not take daily)   [DISCONTINUED] metFORMIN (GLUCOPHAGE) 500 MG tablet TAKE 1 TABLET BY MOUTH TWICE DAILY WITH A MEAL   No facility-administered medications prior to visit.    Review of Systems     Objective    BP 122/80 (BP Location: Right Arm, Patient Position: Sitting, Cuff Size: Normal)    Pulse 87    Temp 97.8 F (36.6 C) (Oral)    Wt 115 lb (52.2 kg)    SpO2 96%    BMI 19.74 kg/m    Physical Exam Vitals and nursing note reviewed.  Constitutional:      General: She is not in acute distress.    Appearance: Normal appearance. She is normal weight. She is not ill-appearing, toxic-appearing or diaphoretic.  HENT:     Head: Normocephalic and atraumatic.  Cardiovascular:     Rate and Rhythm: Normal rate and regular rhythm.     Pulses: Normal pulses.     Heart sounds: Normal heart sounds. No murmur heard.   No friction rub. No gallop.  Pulmonary:     Effort: Pulmonary effort is normal. No respiratory distress.     Breath sounds: Normal breath sounds. No stridor. No wheezing, rhonchi or rales.  Chest:     Chest wall: No tenderness.  Abdominal:     General: Bowel sounds are normal.     Palpations: Abdomen is soft.  Musculoskeletal:        General: No swelling, tenderness, deformity or signs of injury. Normal range of motion.     Right lower leg: No edema.     Left lower leg: No edema.  Skin:    General: Skin is warm and dry.     Capillary Refill: Capillary refill takes less than 2 seconds.     Coloration: Skin is not jaundiced or pale.     Findings: No bruising, erythema, lesion or rash.  Neurological:     General: No focal deficit present.     Mental Status: She is alert and oriented to person, place, and time. Mental status is at baseline.     Cranial Nerves: No cranial nerve deficit.     Sensory: No sensory  deficit.     Motor: No weakness.     Coordination: Coordination normal.  Psychiatric:        Mood and Affect: Mood normal.        Behavior: Behavior normal.        Thought Content: Thought content normal.        Judgment: Judgment normal.     No results found for any visits on 04/12/21.  Assessment & Plan     Problem List Items Addressed This Visit       Cardiovascular and Mediastinum   Aortic atherosclerosis ([place])  Seen on mult imaging On statin Encourage smoking cessation      Relevant Medications   rosuvastatin (CRESTOR) 5 MG tablet   Hypertension associated with diabetes (HCC)    Chronic, stable 122/80 Denies CP Denies SOB Denies DOE No LE Edema noted on exam Continue medication- encouraged daily use Refills stable Seek emergent care if you develop CP, chest pain or chest pressure       Relevant Medications   metFORMIN (GLUCOPHAGE) 500 MG tablet   rosuvastatin (CRESTOR) 5 MG tablet     Respiratory   COPD (chronic obstructive pulmonary disease) (HCC)    Chronic and stable Denies changes Encourage smoking cessation; pt wishes to start on wellbutrin        Endocrine   Acquired hypothyroidism    Chronic and uncontrolled Previously hgh TSH; recent dose increase to125 mcg daily Repeat labs today      Relevant Orders   TSH + free T4   Type 2 diabetes mellitus with hyperglycemia, without long-term current use of insulin (HCC) - Primary    Chronic and very uncontrolled with significant hyperglycemia in past Continue metformin at 529m BID Continue Jardiance at 279mdaily Continue to recommend balanced, lower carb meals. Smaller meal size, adding snacks. Choosing water as drink of choice and increasing purposeful exercise. Encourage Optho appt Foot exam done today      Relevant Medications   metFORMIN (GLUCOPHAGE) 500 MG tablet   rosuvastatin (CRESTOR) 5 MG tablet   Other Relevant Orders   Hemoglobin A1c     Other   Compulsive tobacco user  syndrome    3-5 minute discussion regarding the harms of tobacco use, the benefits of cessation, and methods of cessation Discussed that there are medication options to help with cessation       Relevant Medications   buPROPion (WELLBUTRIN XL) 150 MG 24 hr tablet   Other Relevant Orders   Ambulatory referral to Smoking Cessation Program   Encounter for smoking cessation counseling    Patient expressed interest in quitting; referral placed      Family history of diabetes mellitus in father    Pt notes dad has been gone for 3968ears, was insulin dependent DM with many complications Pt wishes to understand DM dx Has new grandchild coming in June      Hyperlipidemia LDL goal <70    Start statin with goal of LDL <70      Relevant Medications   rosuvastatin (CRESTOR) 5 MG tablet     Return in about 3 months (around 07/13/2021) for T2DM management.      I,Vonna KotykFNP, have reviewed all documentation for this visit. The documentation on 04/12/21 for the exam, diagnosis, procedures, and orders are all accurate and complete.    ElGwyneth [phone]) [phone])  [place]

## 2021-04-12 ENCOUNTER — Other Ambulatory Visit: Payer: Self-pay

## 2021-04-12 ENCOUNTER — Encounter: Payer: Self-pay | Admitting: Family Medicine

## 2021-04-12 ENCOUNTER — Ambulatory Visit (INDEPENDENT_AMBULATORY_CARE_PROVIDER_SITE_OTHER): Payer: BC Managed Care – PPO | Admitting: Family Medicine

## 2021-04-12 VITALS — BP 122/80 | HR 87 | Temp 97.8°F | Wt 115.0 lb

## 2021-04-12 DIAGNOSIS — E785 Hyperlipidemia, unspecified: Secondary | ICD-10-CM | POA: Diagnosis not present

## 2021-04-12 DIAGNOSIS — F17200 Nicotine dependence, unspecified, uncomplicated: Secondary | ICD-10-CM

## 2021-04-12 DIAGNOSIS — I152 Hypertension secondary to endocrine disorders: Secondary | ICD-10-CM

## 2021-04-12 DIAGNOSIS — Z716 Tobacco abuse counseling: Secondary | ICD-10-CM | POA: Insufficient documentation

## 2021-04-12 DIAGNOSIS — I700 Atherosclerosis of aorta: Secondary | ICD-10-CM | POA: Diagnosis not present

## 2021-04-12 DIAGNOSIS — J449 Chronic obstructive pulmonary disease, unspecified: Secondary | ICD-10-CM | POA: Diagnosis not present

## 2021-04-12 DIAGNOSIS — E039 Hypothyroidism, unspecified: Secondary | ICD-10-CM

## 2021-04-12 DIAGNOSIS — Z833 Family history of diabetes mellitus: Secondary | ICD-10-CM

## 2021-04-12 DIAGNOSIS — E1165 Type 2 diabetes mellitus with hyperglycemia: Secondary | ICD-10-CM

## 2021-04-12 DIAGNOSIS — E1159 Type 2 diabetes mellitus with other circulatory complications: Secondary | ICD-10-CM | POA: Diagnosis not present

## 2021-04-12 DIAGNOSIS — F17209 Nicotine dependence, unspecified, with unspecified nicotine-induced disorders: Secondary | ICD-10-CM | POA: Insufficient documentation

## 2021-04-12 HISTORY — DX: Family history of diabetes mellitus: Z83.3

## 2021-04-12 MED ORDER — METFORMIN HCL 500 MG PO TABS: 500.0000 mg | ORAL_TABLET | Freq: Two times a day (BID) | ORAL | 1 refills | Status: DC

## 2021-04-12 MED ORDER — ROSUVASTATIN CALCIUM 5 MG PO TABS: 5.0000 mg | ORAL_TABLET | Freq: Every day | ORAL | 3 refills | Status: DC

## 2021-04-12 MED ORDER — BUPROPION HCL ER (XL) 150 MG PO TB24: 150.0000 mg | ORAL_TABLET | Freq: Every day | ORAL | 1 refills | Status: DC

## 2021-04-12 NOTE — Assessment & Plan Note (Signed)
Chronic and very uncontrolled with significant hyperglycemia in past ?Continue metformin at 500mg  BID ?Continue Jardiance at 25mg  daily ?Continue to recommend balanced, lower carb meals. Smaller meal size, adding snacks. Choosing water as drink of choice and increasing purposeful exercise. ?Encourage Optho appt ?Foot exam done today ?

## 2021-04-12 NOTE — Assessment & Plan Note (Signed)
Chronic, stable 122/80 ?Denies CP ?Denies SOB ?Denies DOE ?No LE Edema noted on exam ?Continue medication- encouraged daily use ?Refills stable ?Seek emergent care if you develop CP, chest pain or chest pressure ? ?

## 2021-04-12 NOTE — Assessment & Plan Note (Signed)
Chronic and stable ?Denies changes ?Encourage smoking cessation; pt wishes to start on wellbutrin ?

## 2021-04-12 NOTE — Assessment & Plan Note (Signed)
Patient expressed interest in quitting; referral placed ?

## 2021-04-12 NOTE — Assessment & Plan Note (Signed)
Start statin with goal of LDL <70 ?

## 2021-04-12 NOTE — Assessment & Plan Note (Signed)
Seen on mult imaging ?On statin ?Encourage smoking cessation ?

## 2021-04-12 NOTE — Assessment & Plan Note (Signed)
Chronic and uncontrolled ?Previously hgh TSH; recent dose increase to125 mcg daily ?Repeat labs today ?

## 2021-04-12 NOTE — Assessment & Plan Note (Signed)
3-5 minute discussion regarding the harms of tobacco use, the benefits of cessation, and methods of cessation ?Discussed that there are medication options to help with cessation ? ?

## 2021-04-12 NOTE — Assessment & Plan Note (Signed)
Pt notes dad has been gone for 39 years, was insulin dependent DM with many complications ?Pt wishes to understand DM dx ?Has new grandchild coming in June ?

## 2021-04-13 LAB — HEMOGLOBIN A1C
Est. average glucose Bld gHb Est-mCnc: 148 mg/dL
Hgb A1c MFr Bld: 6.8 % — ABNORMAL HIGH (ref 4.8–5.6)

## 2021-04-13 LAB — TSH+FREE T4
Free T4: 1.74 ng/dL (ref 0.82–1.77)
TSH: 0.29 u[IU]/mL — ABNORMAL LOW (ref 0.450–4.500)

## 2021-04-15 ENCOUNTER — Other Ambulatory Visit: Payer: Self-pay | Admitting: Family Medicine

## 2021-04-15 DIAGNOSIS — E039 Hypothyroidism, unspecified: Secondary | ICD-10-CM

## 2021-04-15 MED ORDER — LEVOTHYROXINE SODIUM 112 MCG PO TABS
112.0000 ug | ORAL_TABLET | Freq: Every day | ORAL | 0 refills | Status: DC
Start: 2021-04-15 — End: 2021-07-19

## 2021-04-16 DIAGNOSIS — G894 Chronic pain syndrome: Secondary | ICD-10-CM | POA: Diagnosis not present

## 2021-04-16 DIAGNOSIS — Z978 Presence of other specified devices: Secondary | ICD-10-CM | POA: Diagnosis not present

## 2021-04-16 DIAGNOSIS — Z791 Long term (current) use of non-steroidal anti-inflammatories (NSAID): Secondary | ICD-10-CM | POA: Diagnosis not present

## 2021-04-16 DIAGNOSIS — F1190 Opioid use, unspecified, uncomplicated: Secondary | ICD-10-CM | POA: Diagnosis not present

## 2021-04-16 DIAGNOSIS — M1610 Unilateral primary osteoarthritis, unspecified hip: Secondary | ICD-10-CM | POA: Diagnosis not present

## 2021-04-16 DIAGNOSIS — Z79899 Other long term (current) drug therapy: Secondary | ICD-10-CM | POA: Diagnosis not present

## 2021-04-16 DIAGNOSIS — F419 Anxiety disorder, unspecified: Secondary | ICD-10-CM | POA: Diagnosis not present

## 2021-05-30 ENCOUNTER — Ambulatory Visit (INDEPENDENT_AMBULATORY_CARE_PROVIDER_SITE_OTHER): Payer: BC Managed Care – PPO

## 2021-05-30 VITALS — Wt 115.0 lb

## 2021-05-30 DIAGNOSIS — Z1231 Encounter for screening mammogram for malignant neoplasm of breast: Secondary | ICD-10-CM | POA: Diagnosis not present

## 2021-05-30 DIAGNOSIS — Z0000 Encounter for general adult medical examination without abnormal findings: Secondary | ICD-10-CM

## 2021-05-30 NOTE — Progress Notes (Signed)
?Virtual Visit via Telephone Note ? ?I connected with  Tasha Nichols on 05/30/21 at  9:45 AM EDT by telephone and verified that I am speaking with the correct person using two identifiers. ? ?Location: ?Patient: home ?Provider: [facility] ?Persons participating in the virtual visit: patient/Nurse Health Advisor ?  ?I discussed the limitations, risks, security and privacy concerns of performing an evaluation and management service by telephone and the availability of in person appointments. The patient expressed understanding and agreed to proceed. ? ?Interactive audio and video telecommunications were attempted between this nurse and patient, however failed, due to patient having technical difficulties OR patient did not have access to video capability.  We continued and completed visit with audio only. ? ?Some vital signs may be absent or patient reported.  ? ?Hal HopeLorrie S Oluwademilade Mckiver, LPN ? ?Subjective:  ? Tasha Nichols is a 56 y.o. female who presents for an Initial Medicare Annual Wellness Visit. ? ?Review of Systems    ? ?  ? ?   ?Objective:  ?  ?There were no vitals filed for this visit. ?There is no height or weight on file to calculate BMI. ? ? ?  05/15/2020  ?  3:02 PM 04/05/2020  ?  3:00 PM 03/29/2020  ? 10:20 AM 02/11/2018  ?  6:59 PM 02/11/2018  ? 10:39 AM 09/03/2016  ?  8:32 AM 04/05/2015  ? 11:11 AM  ?Advanced Directives  ?Does Patient Have a Medical Advance Directive? No No No  No No No  ?Would patient like information on creating a medical advance directive?  No - Patient declined  No - Patient declined   No - patient declined information  ? ? ?Current Medications (verified) ?Outpatient Encounter Medications as of 05/30/2021  ?Medication Sig  ? levothyroxine (SYNTHROID) 112 MCG tablet one tablet (112 mcg dose).  ? Levothyroxine Sodium 125 MCG CAPS   ? metFORMIN (GLUCOPHAGE) 500 MG tablet Take by mouth.  ? Metformin HCl 500 MG/5ML SOLN   ? naloxone (NARCAN) nasal spray 4 mg/0.1 mL Place into the nose.  ? polyethylene glycol  powder (GLYCOLAX/MIRALAX) 17 GM/SCOOP powder seventeen g.  ? albuterol (VENTOLIN HFA) 108 (90 Base) MCG/ACT inhaler Inhale 2 puffs into the lungs every 6 (six) hours as needed for wheezing or shortness of breath.  ? baclofen (LIORESAL) 0.05 MG/ML injection 1 mL (50 mcg total) by Intrathecal route once for 1 dose. In pain pump  ? buPROPion (WELLBUTRIN XL) 150 MG 24 hr tablet Take 1 tablet (150 mg total) by mouth daily.  ? Cholecalciferol 125 MCG (5000 UT) TABS Take 5,000 Units by mouth daily.  ? cloNIDine (CATAPRES) 0.1 MG tablet Take 1 tablet (0.1 mg total) by mouth 3 (three) times daily. In pain pump  ? empagliflozin (JARDIANCE) 25 MG TABS tablet Take 1 tablet (25 mg total) by mouth daily before breakfast.  ? HYDROcodone-acetaminophen (NORCO) 5-325 MG tablet Take 1-2 tablets by mouth every 4 (four) hours as needed for moderate pain or severe pain.  ? HYDROmorphone in sodium chloride 0.9 % 100 mL Inject into the vein continuous. In pain pump  ? levothyroxine (SYNTHROID) 100 MCG tablet levothyroxine 100 mcg tablet ? TAKE 1 TABLET BY MOUTH ONCE DAILY APPOINTMENT REQUIRED FOR FUTURE REFILLS  ? levothyroxine (SYNTHROID) 112 MCG tablet Take 1 tablet (112 mcg total) by mouth daily.  ? metFORMIN (GLUCOPHAGE) 500 MG tablet Take 1 tablet (500 mg total) by mouth 2 (two) times daily with a meal.  ? rosuvastatin (CRESTOR) 5 MG tablet  Take 1 tablet (5 mg total) by mouth daily.  ? triamterene-hydrochlorothiazide (DYAZIDE) 37.5-25 MG capsule Take 1 capsule by mouth once daily (Patient taking differently: Does not take daily)  ? ?No facility-administered encounter medications on file as of 05/30/2021.  ? ? ?Allergies (verified) ?Morphine and Penicillins  ? ?History: ?Past Medical History:  ?Diagnosis Date  ? Arthritis   ? Chronic pain   ? COPD (chronic obstructive pulmonary disease) (HCC)   ? Diabetes mellitus without complication (HCC)   ? Dyspnea   ? GERD (gastroesophageal reflux disease)   ? Hypertension   ? Hypothyroidism   ?  Pneumonia   ? Thyroid disease   ? ?Past Surgical History:  ?Procedure Laterality Date  ? ABDOMINAL HYSTERECTOMY  2001  ? CESAREAN SECTION  1994  ? JOINT REPLACEMENT Right 2005, 2010  ? hip  ? PAIN PUMP IMPLANTATION  2008, 2014  ? Dr Court Joy  ? TONSILLECTOMY  1970  ? TOTAL HIP REVISION Right 04/05/2020  ? Procedure: Revision Right total hip arthroplasty conversion of metal to ceramic with debridement;  Surgeon: Durene Romans, MD;  Location: [facility];  Service: Orthopedics;  Laterality: Right;  90 mins  ? ?Family History  ?Problem Relation Age of Onset  ? Diabetes Father   ? Breast cancer Neg Hx   ? ?Social History  ? ?Socioeconomic History  ? Marital status: Married  ?  Spouse name: Not on file  ? Number of children: Not on file  ? Years of education: Not on file  ? Highest education level: Not on file  ?Occupational History  ? Not on file  ?Tobacco Use  ? Smoking status: Some Days  ?  Packs/day: 0.25  ?  Types: Cigarettes  ? Smokeless tobacco: Never  ?Vaping Use  ? Vaping Use: Never used  ?Substance and Sexual Activity  ? Alcohol use: No  ? Drug use: No  ? Sexual activity: Not Currently  ?Other Topics Concern  ? Not on file  ?Social History Narrative  ? Not on file  ? ?Social Determinants of Health  ? ?Financial Resource Strain: Not on file  ?Food Insecurity: Not on file  ?Transportation Needs: Not on file  ?Physical Activity: Not on file  ?Stress: Not on file  ?Social Connections: Not on file  ? ? ?Tobacco Counseling ?Ready to quit: Not Answered ?Counseling given: Not Answered ? ? ?Clinical Intake: ? ?Pre-visit preparation completed: Yes ? ?Pain : No/denies pain ? ?  ? ?Nutritional Risks: None ?Diabetes: Yes ?CBG done?: No ?Did pt. bring in CBG monitor from home?: No ? ?How often do you need to have someone help you when you read instructions, pamphlets, or other written materials from your doctor or pharmacy?: 1 - Never ? ?Diabetic?yes ?Nutrition Risk Assessment: ? ?Has the patient had any N/V/D within the last 2  months?  Yes  ?Does the patient have any non-healing wounds?  No  ?Has the patient had any unintentional weight loss or weight gain?  No  ? ?Diabetes: ? ?Is the patient diabetic?  Yes  ?If diabetic, was a CBG obtained today?  No  ?Did the patient bring in their glucometer from home?  No  ?How often do you monitor your CBG's? none.  ? ?Financial Strains and Diabetes Management: ? ?Are you having any financial strains with the device, your supplies or your medication? No .  ?Does the patient want to be seen by Chronic Care Management for management of their diabetes?  No  ?Would the patient  like to be referred to a Nutritionist or for Diabetic Management?  No  ? ?Diabetic Exams: ? ?Diabetic Eye Exam: Completed 12/31/20. . Pt has been advised about the importance in completing this exam.  ? ?Diabetic Foot Exam: Completed 04/12/21. Pt has been advised about the importance in completing this exam.   ? ? ?Interpreter Needed?: No ? ?Information entered by :: Kennedy Bucker, LPN ? ? ?Activities of Daily Living ? ?  04/12/2021  ?  9:17 AM 01/08/2021  ? 10:35 AM  ?In your present state of health, do you have any difficulty performing the following activities:  ?Hearing? 0 0  ?Vision? 1 1  ?Difficulty concentrating or making decisions? 1 1  ?Walking or climbing stairs? 1 1  ?Dressing or bathing? 0 0  ?Doing errands, shopping? 0 0  ? ? ?Patient Care Team: ?Jacky Kindle, FNP as PCP - General (Family Medicine) ? ?Indicate any recent Medical Services you may have received from other than [facility] providers in the past year (date may be approximate). ? ?   ?Assessment:  ? This is a routine wellness examination for Tasha Nichols. ? ?Hearing/Vision screen ?No results found. ? ?Dietary issues and exercise activities discussed: ?  ? ? Goals Addressed   ?None ?  ? ?Depression Screen ? ?  04/12/2021  ?  9:17 AM 01/08/2021  ? 10:36 AM 03/16/2020  ?  1:55 PM 10/06/2017  ?  1:10 PM 09/03/2016  ?  8:42 AM 03/15/2015  ?  2:12 PM  ?PHQ 2/9 Scores  ?PHQ - 2 Score 0 0  0 2 0 1  ?PHQ- 9 Score 9 2 3 9     ?  ?Fall Risk ? ?  04/12/2021  ?  9:17 AM 01/08/2021  ? 10:36 AM 03/16/2020  ?  1:55 PM 10/06/2017  ?  1:10 PM 03/15/2015  ?  2:12 PM  ?Fall Risk   ?Falls in the past year? 0 0 0

## 2021-05-30 NOTE — Patient Instructions (Addendum)
Tasha Nichols , ?Thank you for taking time to come for your Medicare Wellness Visit. I appreciate your ongoing commitment to your health goals. Please review the following plan we discussed and let me know if I can assist you in the future.  ? ?Screening recommendations/referrals: ?Colonoscopy: 07/11/13 ?Mammogram: 07/08/18, referral sent ?Bone Density: n/d ?Recommended yearly ophthalmology/optometry visit for glaucoma screening and checkup ?Recommended yearly dental visit for hygiene and checkup ? ?Vaccinations: ?Influenza vaccine: n/d ?Pneumococcal vaccine: n/d ?Tdap vaccine: 01/08/21 ?Shingles vaccine: n/d  ?Covid-19: 05/17/19, 06/07/19 ? ?Advanced directives: no ? ?Conditions/risks identified: none ? ?Next appointment: Follow up in one year for your annual wellness visit. - 06/03/22 @ 9:30am by phone ? ?Preventive Care 40-64 Years, Female ?Preventive care refers to lifestyle choices and visits with your health care provider that can promote health and wellness. ?What does preventive care include? ?A yearly physical exam. This is also called an annual well check. ?Dental exams once or twice a year. ?Routine eye exams. Ask your health care provider how often you should have your eyes checked. ?Personal lifestyle choices, including: ?Daily care of your teeth and gums. ?Regular physical activity. ?Eating a healthy diet. ?Avoiding tobacco and drug use. ?Limiting alcohol use. ?Practicing safe sex. ?Taking low-dose aspirin daily starting at age 68. ?Taking vitamin and mineral supplements as recommended by your health care provider. ?What happens during an annual well check? ?The services and screenings done by your health care provider during your annual well check will depend on your age, overall health, lifestyle risk factors, and family history of disease. ?Counseling  ?Your health care provider may ask you questions about your: ?Alcohol use. ?Tobacco use. ?Drug use. ?Emotional well-being. ?Home and relationship  well-being. ?Sexual activity. ?Eating habits. ?Work and work Statistician. ?Method of birth control. ?Menstrual cycle. ?Pregnancy history. ?Screening  ?You may have the following tests or measurements: ?Height, weight, and BMI. ?Blood pressure. ?Lipid and cholesterol levels. These may be checked every 5 years, or more frequently if you are over 20 years old. ?Skin check. ?Lung cancer screening. You may have this screening every year starting at age 85 if you have a 30-pack-year history of smoking and currently smoke or have quit within the past 15 years. ?Fecal occult blood test (FOBT) of the stool. You may have this test every year starting at age 83. ?Flexible sigmoidoscopy or colonoscopy. You may have a sigmoidoscopy every 5 years or a colonoscopy every 10 years starting at age 34. ?Hepatitis C blood test. ?Hepatitis B blood test. ?Sexually transmitted disease (STD) testing. ?Diabetes screening. This is done by checking your blood sugar (glucose) after you have not eaten for a while (fasting). You may have this done every 1-3 years. ?Mammogram. This may be done every 1-2 years. Talk to your health care provider about when you should start having regular mammograms. This may depend on whether you have a family history of breast cancer. ?BRCA-related cancer screening. This may be done if you have a family history of breast, ovarian, tubal, or peritoneal cancers. ?Pelvic exam and Pap test. This may be done every 3 years starting at age 71. Starting at age 83, this may be done every 5 years if you have a Pap test in combination with an HPV test. ?Bone density scan. This is done to screen for osteoporosis. You may have this scan if you are at high risk for osteoporosis. ?Discuss your test results, treatment options, and if necessary, the need for more tests with your health care provider. ?  Vaccines  ?Your health care provider may recommend certain vaccines, such as: ?Influenza vaccine. This is recommended every  year. ?Tetanus, diphtheria, and acellular pertussis (Tdap, Td) vaccine. You may need a Td booster every 10 years. ?Zoster vaccine. You may need this after age 33. ?Pneumococcal 13-valent conjugate (PCV13) vaccine. You may need this if you have certain conditions and were not previously vaccinated. ?Pneumococcal polysaccharide (PPSV23) vaccine. You may need one or two doses if you smoke cigarettes or if you have certain conditions. ?Talk to your health care provider about which screenings and vaccines you need and how often you need them. ?This information is not intended to replace advice given to you by your health care provider. Make sure you discuss any questions you have with your health care provider. ?Document Released: 02/23/2015 Document Revised: 10/17/2015 Document Reviewed: 11/28/2014 ?Elsevier Interactive Patient Education ? 2017 [employer] ? ? ? ?Fall Prevention in the Home ?Falls can cause injuries. They can happen to people of all ages. There are many things you can do to make your home safe and to help prevent falls. ?What can I do on the outside of my home? ?Regularly fix the edges of walkways and driveways and fix any cracks. ?Remove anything that might make you trip as you walk through a door, such as a raised step or threshold. ?Trim any bushes or trees on the path to your home. ?Use bright outdoor lighting. ?Clear any walking paths of anything that might make someone trip, such as rocks or tools. ?Regularly check to see if handrails are loose or broken. Make sure that both sides of any steps have handrails. ?Any raised decks and porches should have guardrails on the edges. ?Have any leaves, snow, or ice cleared regularly. ?Use sand or salt on walking paths during winter. ?Clean up any spills in your garage right away. This includes oil or grease spills. ?What can I do in the bathroom? ?Use night lights. ?Install grab bars by the toilet and in the tub and shower. Do not use towel bars as grab  bars. ?Use non-skid mats or decals in the tub or shower. ?If you need to sit down in the shower, use a plastic, non-slip stool. ?Keep the floor dry. Clean up any water that spills on the floor as soon as it happens. ?Remove soap buildup in the tub or shower regularly. ?Attach bath mats securely with double-sided non-slip rug tape. ?Do not have throw rugs and other things on the floor that can make you trip. ?What can I do in the bedroom? ?Use night lights. ?Make sure that you have a light by your bed that is easy to reach. ?Do not use any sheets or blankets that are too big for your bed. They should not hang down onto the floor. ?Have a firm chair that has side arms. You can use this for support while you get dressed. ?Do not have throw rugs and other things on the floor that can make you trip. ?What can I do in the kitchen? ?Clean up any spills right away. ?Avoid walking on wet floors. ?Keep items that you use a lot in easy-to-reach places. ?If you need to reach something above you, use a strong step stool that has a grab bar. ?Keep electrical cords out of the way. ?Do not use floor polish or wax that makes floors slippery. If you must use wax, use non-skid floor wax. ?Do not have throw rugs and other things on the floor that can make you  trip. ?What can I do with my stairs? ?Do not leave any items on the stairs. ?Make sure that there are handrails on both sides of the stairs and use them. Fix handrails that are broken or loose. Make sure that handrails are as long as the stairways. ?Check any carpeting to make sure that it is firmly attached to the stairs. Fix any carpet that is loose or worn. ?Avoid having throw rugs at the top or bottom of the stairs. If you do have throw rugs, attach them to the floor with carpet tape. ?Make sure that you have a light switch at the top of the stairs and the bottom of the stairs. If you do not have them, ask someone to add them for you. ?What else can I do to help prevent  falls? ?Wear shoes that: ?Do not have high heels. ?Have rubber bottoms. ?Are comfortable and fit you well. ?Are closed at the toe. Do not wear sandals. ?If you use a stepladder: ?Make sure that it is fully opened. Do not climb

## 2021-06-11 DIAGNOSIS — F1190 Opioid use, unspecified, uncomplicated: Secondary | ICD-10-CM | POA: Diagnosis not present

## 2021-06-11 DIAGNOSIS — Z978 Presence of other specified devices: Secondary | ICD-10-CM | POA: Diagnosis not present

## 2021-06-11 DIAGNOSIS — M1610 Unilateral primary osteoarthritis, unspecified hip: Secondary | ICD-10-CM | POA: Diagnosis not present

## 2021-06-11 DIAGNOSIS — G894 Chronic pain syndrome: Secondary | ICD-10-CM | POA: Diagnosis not present

## 2021-06-11 DIAGNOSIS — Z79899 Other long term (current) drug therapy: Secondary | ICD-10-CM | POA: Diagnosis not present

## 2021-06-11 DIAGNOSIS — F419 Anxiety disorder, unspecified: Secondary | ICD-10-CM | POA: Diagnosis not present

## 2021-07-01 ENCOUNTER — Other Ambulatory Visit: Payer: Self-pay | Admitting: Family Medicine

## 2021-07-12 NOTE — Progress Notes (Unsigned)
Established patient visit  I,Joseline E Rosas,acting as a scribe for Gwyneth Sprout, FNP.,have documented all relevant documentation on the behalf of Gwyneth Sprout, FNP,as directed by  Gwyneth Sprout, FNP while in the presence of Gwyneth Sprout, FNP.   [name]   DOB: [birth year]   56 y.o. Female  MRN: [id number] Visit Date: 07/16/2021  Today's healthcare provider: Gwyneth Sprout, FNP  Re Introduced to nurse practitioner role and practice setting.  All questions answered.  Discussed provider/patient relationship and expectations.   Chief Complaint  Patient presents with   follow-up DMT2   Subjective    HPI  Diabetes Mellitus Type II, Follow-up  Lab Results  Component Value Date   HGBA1C 6.4 (A) 07/16/2021   HGBA1C 6.8 (H) 04/12/2021   HGBA1C >15.5 (H) 01/01/2021   Wt Readings from Last 3 Encounters:  07/16/21 110 lb (49.9 kg)  05/30/21 115 lb (52.2 kg)  04/12/21 115 lb (52.2 kg)   Last seen for diabetes 3 months ago.  Management since then includes continue medication. She reports excellent compliance with treatment. She is not having side effects.  Symptoms: No fatigue No foot ulcerations  No appetite changes No nausea  No paresthesia of the feet  No polydipsia  No polyuria No visual disturbances   No vomiting     Home blood sugar records:  not being checked  Episodes of hypoglycemia? No    Current insulin regiment: none Most Recent Eye Exam: 12/2020 Current exercise: none Current diet habits: well balanced  Pertinent Labs: Lab Results  Component Value Date   CHOL 169 01/01/2021   HDL 44 01/01/2021   LDLCALC 110 (H) 01/01/2021   TRIG 82 01/01/2021   CHOLHDL 4.9 (H) 10/06/2017   Lab Results  Component Value Date   NA 139 01/01/2021   K 4.6 01/01/2021   CREATININE 1.07 (H) 01/01/2021   EGFR 61 01/01/2021   LABMICR <3.0 01/08/2021     ---------------------------------------------------------------------------------------------------   Hypothyroid, follow-up: Last labs showed thyroid overcorrected need to recheck.  Lab Results  Component Value Date   TSH 0.290 (L) 04/12/2021   TSH 9.770 (H) 01/01/2021   TSH 6.210 (H) 03/16/2020   FREET4 1.74 04/12/2021   FREET4 1.63 01/01/2021   T4TOTAL 7.0 03/11/2019   T4TOTAL 9.4 03/05/2016      Medications: Outpatient Medications Prior to Visit  Medication Sig   albuterol (VENTOLIN HFA) 108 (90 Base) MCG/ACT inhaler Inhale 2 puffs into the lungs every 6 (six) hours as needed for wheezing or shortness of breath.   baclofen (LIORESAL) 0.05 MG/ML injection 1 mL (50 mcg total) by Intrathecal route once for 1 dose. In pain pump   buPROPion (WELLBUTRIN XL) 150 MG 24 hr tablet Take 1 tablet (150 mg total) by mouth daily.   Cholecalciferol 125 MCG (5000 UT) TABS Take 5,000 Units by mouth daily.   cloNIDine (CATAPRES) 0.1 MG tablet Take 1 tablet (0.1 mg total) by mouth 3 (three) times daily. In pain pump   HYDROcodone-acetaminophen (NORCO) 5-325 MG tablet Take 1-2 tablets by mouth every 4 (four) hours as needed for moderate pain or severe pain.   HYDROmorphone in sodium chloride 0.9 % 100 mL Inject into the vein continuous. In pain pump   JARDIANCE 25 MG TABS tablet TAKE 1 TABLET BY MOUTH ONCE DAILY BEFORE BREAKFAST   levothyroxine (SYNTHROID) 100 MCG tablet levothyroxine 100 mcg tablet  TAKE 1 TABLET BY MOUTH ONCE DAILY APPOINTMENT REQUIRED FOR  FUTURE REFILLS   levothyroxine (SYNTHROID) 112 MCG tablet Take 1 tablet (112 mcg total) by mouth daily.   levothyroxine (SYNTHROID) 112 MCG tablet one tablet (112 mcg dose).   Levothyroxine Sodium 125 MCG CAPS    naloxone (NARCAN) nasal spray 4 mg/0.1 mL Place into the nose.   rosuvastatin (CRESTOR) 5 MG tablet Take 1 tablet (5 mg total) by mouth daily.   triamterene-hydrochlorothiazide (DYAZIDE) 37.5-25 MG capsule Take 1 capsule by mouth once daily   [DISCONTINUED] metFORMIN (GLUCOPHAGE) 500 MG tablet Take 1 tablet (500 mg total) by mouth 2  (two) times daily with a meal.   [DISCONTINUED] metFORMIN (GLUCOPHAGE) 500 MG tablet Take by mouth.   [DISCONTINUED] Metformin HCl 500 MG/5ML SOLN    [DISCONTINUED] polyethylene glycol powder (GLYCOLAX/MIRALAX) 17 GM/SCOOP powder seventeen g.   No facility-administered medications prior to visit.    Review of Systems  Constitutional:  Negative for appetite change, chills, fatigue and fever.  Respiratory:  Negative for chest tightness and shortness of breath.   Cardiovascular:  Negative for chest pain and palpitations.  Gastrointestinal:  Negative for abdominal pain, nausea and vomiting.  Neurological:  Negative for dizziness and weakness.      Objective    BP 128/75 (BP Location: Left Arm, Patient Position: Sitting, Cuff Size: Normal)   Pulse 85   Temp 98 F (36.7 C) (Oral)   Resp 16   Ht 5' 4"  (1.626 m)   Wt 110 lb (49.9 kg)   BMI 18.88 kg/m    Physical Exam Vitals and nursing note reviewed.  Constitutional:      General: She is not in acute distress.    Appearance: Normal appearance. She is underweight. She is not ill-appearing, toxic-appearing or diaphoretic.  HENT:     Head: Normocephalic and atraumatic.  Cardiovascular:     Rate and Rhythm: Normal rate and regular rhythm.     Pulses: Normal pulses.     Heart sounds: Normal heart sounds. No murmur heard.   No friction rub. No gallop.  Pulmonary:     Effort: Pulmonary effort is normal. No respiratory distress.     Breath sounds: Normal breath sounds. No stridor. No wheezing, rhonchi or rales.  Chest:     Chest wall: No tenderness.  Abdominal:     General: Bowel sounds are normal.     Palpations: Abdomen is soft.  Musculoskeletal:        General: No swelling, tenderness, deformity or signs of injury. Normal range of motion.     Right lower leg: No edema.     Left lower leg: No edema.  Skin:    General: Skin is warm and dry.     Capillary Refill: Capillary refill takes less than 2 seconds.     Coloration: Skin  is not jaundiced or pale.     Findings: No bruising, erythema, lesion or rash.  Neurological:     General: No focal deficit present.     Mental Status: She is alert and oriented to person, place, and time. Mental status is at baseline.     Cranial Nerves: No cranial nerve deficit.     Sensory: No sensory deficit.     Motor: No weakness.     Coordination: Coordination normal.  Psychiatric:        Mood and Affect: Mood normal.        Behavior: Behavior normal.        Thought Content: Thought content normal.  Judgment: Judgment normal.      Results for orders placed or performed in visit on 07/16/21  POCT glycosylated hemoglobin (Hb A1C)  Result Value Ref Range   Hemoglobin A1C 6.4 (A) 4.0 - 5.6 %   Est. average glucose Bld gHb Est-mCnc 137     Assessment & Plan     Problem List Items Addressed This Visit       Endocrine   Acquired hypothyroidism    Chronic, variable Weight now under BMI goal of 19 Repeat TSH/free T4 Previously on 112 mcg/100 mcg lab dependent  -sleeping better -now cold natured, now longer hot natured -denies palputations       Relevant Orders   TSH + free T4   Type 2 diabetes mellitus with hyperglycemia, without long-term current use of insulin (HCC) - Primary    Chronic, improved; A1c down from previously >15% to 6.4% Wishes to change metformin from 500 BID to 500 mg QD; however, recommend use of 1/2 tablet at this point if tolerated with goal of 750 mg QD Will change to 750 mg ER Metformin once current Rx is completed. Continue Jardiance 25 mg QD  Continue Crestor 5 mg Not on Ace/ARB at this point; BP is 128/75 Continue to recommend balanced, lower carb meals. Smaller meal size, adding snacks. Choosing water as drink of choice and increasing purposeful exercise.         Relevant Medications   metFORMIN (GLUCOPHAGE) 500 MG tablet   Other Relevant Orders   POCT glycosylated hemoglobin (Hb A1C) (Completed)     Other   RESOLVED:  Depression, recurrent (HCC)    Chronic, stable        Tobacco dependence due to cigarettes    Chronic, improved Down to 3 cigs/day          Return in about 26 weeks (around 01/14/2022) for annual examination.      Vonna Kotyk, FNP, have reviewed all documentation for this visit. The documentation on 07/16/21 for the exam, diagnosis, procedures, and orders are all accurate and complete.    Gwyneth Sprout, [place] [phone] (phone) [phone] (fax)  [place]

## 2021-07-16 ENCOUNTER — Ambulatory Visit (INDEPENDENT_AMBULATORY_CARE_PROVIDER_SITE_OTHER): Payer: BC Managed Care – PPO | Admitting: Family Medicine

## 2021-07-16 ENCOUNTER — Encounter: Payer: Self-pay | Admitting: Family Medicine

## 2021-07-16 VITALS — BP 128/75 | HR 85 | Temp 98.0°F | Resp 16 | Ht 64.0 in | Wt 110.0 lb

## 2021-07-16 DIAGNOSIS — F339 Major depressive disorder, recurrent, unspecified: Secondary | ICD-10-CM | POA: Insufficient documentation

## 2021-07-16 DIAGNOSIS — E1165 Type 2 diabetes mellitus with hyperglycemia: Secondary | ICD-10-CM | POA: Diagnosis not present

## 2021-07-16 DIAGNOSIS — E039 Hypothyroidism, unspecified: Secondary | ICD-10-CM | POA: Diagnosis not present

## 2021-07-16 DIAGNOSIS — F17210 Nicotine dependence, cigarettes, uncomplicated: Secondary | ICD-10-CM | POA: Diagnosis not present

## 2021-07-16 LAB — POCT GLYCOSYLATED HEMOGLOBIN (HGB A1C)
Est. average glucose Bld gHb Est-mCnc: 137
Hemoglobin A1C: 6.4 % — AB (ref 4.0–5.6)

## 2021-07-16 MED ORDER — METFORMIN HCL 500 MG PO TABS: 750.0000 mg | ORAL_TABLET | Freq: Every day | ORAL | 1 refills | Status: DC

## 2021-07-16 NOTE — Assessment & Plan Note (Signed)
Chronic, stable 

## 2021-07-16 NOTE — Assessment & Plan Note (Signed)
Chronic, improved; A1c down from previously >15% to 6.4% Wishes to change metformin from 500 BID to 500 mg QD; however, recommend use of 1/2 tablet at this point if tolerated with goal of 750 mg QD Will change to 750 mg ER Metformin once current Rx is completed. Continue Jardiance 25 mg QD  Continue Crestor 5 mg Not on Ace/ARB at this point; BP is 128/75 Continue to recommend balanced, lower carb meals. Smaller meal size, adding snacks. Choosing water as drink of choice and increasing purposeful exercise.

## 2021-07-16 NOTE — Assessment & Plan Note (Signed)
Chronic, variable Weight now under BMI goal of 19 Repeat TSH/free T4 Previously on 112 mcg/100 mcg lab dependent  -sleeping better -now cold natured, now longer hot natured -denies palputations

## 2021-07-16 NOTE — Assessment & Plan Note (Signed)
Chronic, improved Down to 3 cigs/day

## 2021-07-17 LAB — TSH+FREE T4
Free T4: 1.95 ng/dL — ABNORMAL HIGH (ref 0.82–1.77)
TSH: 0.735 u[IU]/mL (ref 0.450–4.500)

## 2021-07-19 ENCOUNTER — Other Ambulatory Visit: Payer: Self-pay | Admitting: Family Medicine

## 2021-07-19 DIAGNOSIS — E039 Hypothyroidism, unspecified: Secondary | ICD-10-CM

## 2021-07-19 NOTE — Telephone Encounter (Signed)
Requested medication (s) are due for refill today: yes  Requested medication (s) are on the active medication list: yes    Last refill: 04/15/21  #90  0  refills  Future visit scheduled no  Notes to clinic:Other doses of med on current profile. Please review. Thank you!  Requested Prescriptions  Pending Prescriptions Disp Refills   levothyroxine (SYNTHROID) 112 MCG tablet [Pharmacy Med Name: Levothyroxine Sodium 112 MCG Oral Tablet] 90 tablet 0    Sig: Take 1 tablet by mouth once daily     Endocrinology:  Hypothyroid Agents Passed - 07/19/2021  9:33 AM      Passed - TSH in normal range and within 360 days    TSH  Date Value Ref Range Status  07/16/2021 0.735 0.450 - 4.500 uIU/mL Final         Passed - Valid encounter within last 12 months    Recent Outpatient Visits           3 days ago Type 2 diabetes mellitus with hyperglycemia, without long-term current use of insulin [facility])   [facility] Merita Norton T, FNP   3 months ago Type 2 diabetes mellitus with hyperglycemia, without long-term current use of insulin [facility])   [facility] Jacky Kindle, FNP   6 months ago Encounter for annual physical exam   [facility] [place], Marzella Schlein, [place]   1 year ago New onset type 2 diabetes mellitus [facility])   [facility] [place], Alessandra [place], [place]   1 year ago Pre-op evaluation   [facility] [place], [place], [place]

## 2021-07-29 ENCOUNTER — Other Ambulatory Visit: Payer: Self-pay | Admitting: Family Medicine

## 2021-07-29 NOTE — Telephone Encounter (Signed)
Requested Prescriptions  Pending Prescriptions Disp Refills  . JARDIANCE 25 MG TABS tablet [Pharmacy Med Name: Jardiance 25 MG Oral Tablet] 90 tablet 1    Sig: TAKE 1 TABLET BY MOUTH ONCE DAILY BEFORE BREAKFAST     Endocrinology:  Diabetes - SGLT2 Inhibitors Failed - 07/29/2021  9:18 AM      Failed - Cr in normal range and within 360 days    Creatinine, Ser  Date Value Ref Range Status  01/01/2021 1.07 (H) 0.57 - 1.00 mg/dL Final         Passed - HBA1C is between 0 and 7.9 and within 180 days    Hemoglobin A1C  Date Value Ref Range Status  07/16/2021 6.4 (A) 4.0 - 5.6 % Final   Hgb A1c MFr Bld  Date Value Ref Range Status  04/12/2021 6.8 (H) 4.8 - 5.6 % Final    Comment:             Prediabetes: 5.7 - 6.4          Diabetes: >6.4          Glycemic control for adults with diabetes: <7.0          Passed - eGFR in normal range and within 360 days    GFR calc Af Amer  Date Value Ref Range Status  03/16/2020 59 (L) >59 mL/min/1.73 Final    Comment:    **In accordance with recommendations from the NKF-ASN Task force,**   [facility] is in the process of updating its eGFR calculation to the   2021 CKD-EPI creatinine equation that estimates kidney function   without a race variable.    GFR, Estimated  Date Value Ref Range Status  04/06/2020 51 (L) >60 mL/min Final    Comment:    (NOTE) Calculated using the CKD-EPI Creatinine Equation (2021)    eGFR  Date Value Ref Range Status  01/01/2021 61 >59 mL/min/1.73 Final         Passed - Valid encounter within last 6 months    Recent Outpatient Visits          1 week ago Type 2 diabetes mellitus with hyperglycemia, without long-term current use of insulin [facility])   [facility] Joe T, FNP   3 months ago Type 2 diabetes mellitus with hyperglycemia, without long-term current use of insulin [facility])   [facility] Gwyneth Sprout, FNP   6 months ago Encounter for annual physical exam   [facility], Dionne Bucy, [place]   1 year ago New onset type 2 diabetes mellitus [facility])   [facility] [place], Clearnce Sorrel, [place]   1 year ago Pre-op evaluation   [facility] [place], [place], [place]

## 2021-08-07 DIAGNOSIS — F1190 Opioid use, unspecified, uncomplicated: Secondary | ICD-10-CM | POA: Diagnosis not present

## 2021-08-07 DIAGNOSIS — F419 Anxiety disorder, unspecified: Secondary | ICD-10-CM | POA: Diagnosis not present

## 2021-08-07 DIAGNOSIS — G894 Chronic pain syndrome: Secondary | ICD-10-CM | POA: Diagnosis not present

## 2021-08-07 DIAGNOSIS — Z79899 Other long term (current) drug therapy: Secondary | ICD-10-CM | POA: Diagnosis not present

## 2021-08-07 DIAGNOSIS — M1610 Unilateral primary osteoarthritis, unspecified hip: Secondary | ICD-10-CM | POA: Diagnosis not present

## 2021-08-07 DIAGNOSIS — Z978 Presence of other specified devices: Secondary | ICD-10-CM | POA: Diagnosis not present

## 2021-09-17 DIAGNOSIS — J449 Chronic obstructive pulmonary disease, unspecified: Secondary | ICD-10-CM | POA: Diagnosis not present

## 2021-09-17 DIAGNOSIS — F439 Reaction to severe stress, unspecified: Secondary | ICD-10-CM | POA: Diagnosis not present

## 2021-09-17 DIAGNOSIS — Z7984 Long term (current) use of oral hypoglycemic drugs: Secondary | ICD-10-CM | POA: Diagnosis not present

## 2021-09-17 DIAGNOSIS — F419 Anxiety disorder, unspecified: Secondary | ICD-10-CM | POA: Diagnosis not present

## 2021-09-17 DIAGNOSIS — E119 Type 2 diabetes mellitus without complications: Secondary | ICD-10-CM | POA: Diagnosis not present

## 2021-09-17 DIAGNOSIS — F3342 Major depressive disorder, recurrent, in full remission: Secondary | ICD-10-CM | POA: Diagnosis not present

## 2021-09-17 DIAGNOSIS — R030 Elevated blood-pressure reading, without diagnosis of hypertension: Secondary | ICD-10-CM | POA: Diagnosis not present

## 2021-09-17 DIAGNOSIS — M1990 Unspecified osteoarthritis, unspecified site: Secondary | ICD-10-CM | POA: Diagnosis not present

## 2021-09-17 DIAGNOSIS — E039 Hypothyroidism, unspecified: Secondary | ICD-10-CM | POA: Diagnosis not present

## 2021-10-02 DIAGNOSIS — Z5181 Encounter for therapeutic drug level monitoring: Secondary | ICD-10-CM | POA: Diagnosis not present

## 2021-10-02 DIAGNOSIS — Z79899 Other long term (current) drug therapy: Secondary | ICD-10-CM | POA: Diagnosis not present

## 2021-10-02 DIAGNOSIS — M1611 Unilateral primary osteoarthritis, right hip: Secondary | ICD-10-CM | POA: Diagnosis not present

## 2021-10-02 DIAGNOSIS — G894 Chronic pain syndrome: Secondary | ICD-10-CM | POA: Diagnosis not present

## 2021-10-10 ENCOUNTER — Other Ambulatory Visit: Payer: Self-pay | Admitting: Family Medicine

## 2021-10-10 DIAGNOSIS — E039 Hypothyroidism, unspecified: Secondary | ICD-10-CM

## 2021-11-01 ENCOUNTER — Other Ambulatory Visit: Payer: Self-pay | Admitting: Family Medicine

## 2021-11-01 ENCOUNTER — Telehealth: Payer: Self-pay | Admitting: Family Medicine

## 2021-11-01 DIAGNOSIS — J449 Chronic obstructive pulmonary disease, unspecified: Secondary | ICD-10-CM

## 2021-11-01 MED ORDER — ALBUTEROL SULFATE HFA 108 (90 BASE) MCG/ACT IN AERS: 2.0000 | INHALATION_SPRAY | Freq: Four times a day (QID) | RESPIRATORY_TRACT | 2 refills | Status: DC | PRN

## 2021-11-01 NOTE — Telephone Encounter (Signed)
[facility] faxed refill request for the following medications:  albuterol (VENTOLIN HFA) 108 (90 Base) MCG/ACT inhaler   Please advise.  

## 2021-11-25 ENCOUNTER — Ambulatory Visit: Payer: BC Managed Care – PPO | Admitting: Family Medicine

## 2021-11-27 DIAGNOSIS — M1610 Unilateral primary osteoarthritis, unspecified hip: Secondary | ICD-10-CM | POA: Diagnosis not present

## 2021-11-27 DIAGNOSIS — F419 Anxiety disorder, unspecified: Secondary | ICD-10-CM | POA: Diagnosis not present

## 2021-11-27 DIAGNOSIS — Z978 Presence of other specified devices: Secondary | ICD-10-CM | POA: Diagnosis not present

## 2021-11-27 DIAGNOSIS — F1190 Opioid use, unspecified, uncomplicated: Secondary | ICD-10-CM | POA: Diagnosis not present

## 2021-11-27 DIAGNOSIS — Z79899 Other long term (current) drug therapy: Secondary | ICD-10-CM | POA: Diagnosis not present

## 2021-11-27 DIAGNOSIS — G894 Chronic pain syndrome: Secondary | ICD-10-CM | POA: Diagnosis not present

## 2021-12-01 IMAGING — CT NM BONE 3 PHASE
1 of 4 series · 1 of 19 positions shown · non-contrast
Comparison: March 28, 2008 x-rays of the pelvis and right hip.

CLINICAL DATA: Right hip replacement in 9770 and 6939

EXAM:
NUCLEAR MEDICINE 3-PHASE BONE SCAN
TECHNIQUE: Radionuclide angiographic images, immediate static blood pool
images, and 3-hour delayed static images were obtained of the
pelvis/hips after intravenous injection of radiopharmaceutical.
RADIOPHARMACEUTICALS:  21.68 mCi Pc-77m MDP IV

[Series 1000: bone (recon - ac ) · 4.8mm · 4.80mm/px · 1 of 76 frames shown]
[frame 45/76]
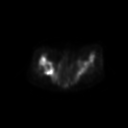

[1 of 19 positions shown; findings below may reference images not displayed]

Bone scan with CT images from August 05, 2016. X-rays of the right hip
from October 28, 2018.
FINDINGS: Vascular phase: Normal

Blood pool phase: Normal

Delayed phase: No significant abnormalities associated with the left
hip. There is diffuse increased uptake adjacent to the acetabular
component which is unchanged since August 05, 2016. There is some
uptake at the right greater trochanter which is also stable. There
is no uptake at the distal tip of the right femoral prosthesis.

CT images: There is a rounded lucency inferior to the acetabular
component on the right. This is stable since Monday July, 2016, likely
representing sequela of particle disease. There is some mild lucency
adjacent to the proximal femoral component as well which is also
stable since Monday July, 2016. Finally, 2 of the acetabular component
screws extend into the adjacent musculature, just lateral to the
acetabulum.
IMPRESSION: 1. No evidence of femoral component loosening. Uptake at the greater
trochanter is stable since Monday July, 2016 and there is no abnormal
uptake at the distal tip of the femoral component.
2. Diffuse uptake around the acetabular component is stable since
Monday July, 2016 and may represent loosening or bony remodeling. There
has been no imaging progression.
3. Lucency in the bone inferior to the right acetabular component
and more subtle loosening adjacent to the proximal femoral component
are likely sequela of particle disease, stable compared to [DATE]. Two of the acetabular screws extend into the adjacent musculature
as above.

## 2022-01-11 ENCOUNTER — Other Ambulatory Visit: Payer: Self-pay | Admitting: Family Medicine

## 2022-01-11 DIAGNOSIS — E039 Hypothyroidism, unspecified: Secondary | ICD-10-CM

## 2022-01-13 NOTE — Telephone Encounter (Signed)
Requested Prescriptions  Pending Prescriptions Disp Refills   levothyroxine (SYNTHROID) 112 MCG tablet [Pharmacy Med Name: Levothyroxine Sodium 112 MCG Oral Tablet] 90 tablet 0    Sig: Take 1 tablet by mouth once daily     Endocrinology:  Hypothyroid Agents Passed - 01/11/2022  9:36 AM      Passed - TSH in normal range and within 360 days    TSH  Date Value Ref Range Status  07/16/2021 0.735 0.450 - 4.500 uIU/mL Final         Passed - Valid encounter within last 12 months    Recent Outpatient Visits           6 months ago Type 2 diabetes mellitus with hyperglycemia, without long-term current use of insulin [facility])   [facility] Merita Norton T, FNP   9 months ago Type 2 diabetes mellitus with hyperglycemia, without long-term current use of insulin [facility])   [facility] Jacky Kindle, FNP   1 year ago Encounter for annual physical exam   [facility], Marzella Schlein, [place]   1 year ago New onset type 2 diabetes mellitus [facility])   [facility] [place], Alessandra [place]vel[place], [place]   1 year ago Pre-op evaluation   [facility] [place], [place], [place]

## 2022-01-14 ENCOUNTER — Other Ambulatory Visit: Payer: Self-pay | Admitting: Family Medicine

## 2022-01-14 DIAGNOSIS — E039 Hypothyroidism, unspecified: Secondary | ICD-10-CM

## 2022-01-14 NOTE — Telephone Encounter (Signed)
Called pharmacy to verify that receipt confirmed by pharmacy 01/13/22 at 11:06 am. No answer rang multiple times no answer.

## 2022-01-14 NOTE — Telephone Encounter (Signed)
Interface surescripts request. Medication already responded to 01/13/22. Receipt confirmed by pharmacy 01/13/22 at 11:06 am.  Requested Prescriptions  Refused Prescriptions Disp Refills   levothyroxine (SYNTHROID) 112 MCG tablet [Pharmacy Med Name: Levothyroxine Sodium 112 MCG Oral Tablet] 90 tablet 0    Sig: Take 1 tablet by mouth once daily     Endocrinology:  Hypothyroid Agents Passed - 01/14/2022  4:35 PM      Passed - TSH in normal range and within 360 days    TSH  Date Value Ref Range Status  07/16/2021 0.735 0.450 - 4.500 uIU/mL Final         Passed - Valid encounter within last 12 months    Recent Outpatient Visits           6 months ago Type 2 diabetes mellitus with hyperglycemia, without long-term current use of insulin [facility])   [facility] Mer[facility] Norton T, FNP   9 months ago Type 2 diabetes mellitus with hyperglycemia, without long-term current use of insulin [facility])   [facility] Jacky Kindle, FNP   1 year ago Encounter for annual physical exam   [facility], Marzella Schlein, [place]   1 year ago New onset type 2 diabetes mellitus [facility])   [facility] [place], Alessandra Bevel[place], [place]   1 year ago Pre-op evaluation   [facility] [place], [place], [place]

## 2022-01-15 DIAGNOSIS — F419 Anxiety disorder, unspecified: Secondary | ICD-10-CM | POA: Diagnosis not present

## 2022-01-15 DIAGNOSIS — Z978 Presence of other specified devices: Secondary | ICD-10-CM | POA: Diagnosis not present

## 2022-01-15 DIAGNOSIS — M1610 Unilateral primary osteoarthritis, unspecified hip: Secondary | ICD-10-CM | POA: Diagnosis not present

## 2022-01-15 DIAGNOSIS — G894 Chronic pain syndrome: Secondary | ICD-10-CM | POA: Diagnosis not present

## 2022-01-15 DIAGNOSIS — Z79899 Other long term (current) drug therapy: Secondary | ICD-10-CM | POA: Diagnosis not present

## 2022-01-17 ENCOUNTER — Other Ambulatory Visit: Payer: Self-pay | Admitting: Family Medicine

## 2022-01-17 DIAGNOSIS — E039 Hypothyroidism, unspecified: Secondary | ICD-10-CM

## 2022-01-17 NOTE — Telephone Encounter (Signed)
Refilled 01/13/2022 - Confirmed by same pharmacy. Requested Prescriptions  Pending Prescriptions Disp Refills   levothyroxine (SYNTHROID) 112 MCG tablet [Pharmacy Med Name: Levothyroxine Sodium 112 MCG Oral Tablet] 90 tablet 0    Sig: Take 1 tablet by mouth once daily     Endocrinology:  Hypothyroid Agents Passed - 01/17/2022  6:54 AM      Passed - TSH in normal range and within 360 days    TSH  Date Value Ref Range Status  07/16/2021 0.735 0.450 - 4.500 uIU/mL Final         Passed - Valid encounter within last 12 months    Recent Outpatient Visits           6 months ago Type 2 diabetes mellitus with hyperglycemia, without long-term current use of insulin [facility])   [facility] Merita Norton T, FNP   9 months ago Type 2 diabetes mellitus with hyperglycemia, without long-term current use of insulin [facility])   [facility] Jacky Kindle, FNP   1 year ago Encounter for annual physical exam   [facility], Marzella Schlein, [place]   1 year ago New onset type 2 diabetes mellitus [facility])   [facility] [place], Alessandra Bevels, [place]   1 year ago Pre-op evaluation   [facility] [place], [place], [place]

## 2022-01-20 ENCOUNTER — Other Ambulatory Visit: Payer: Self-pay | Admitting: Family Medicine

## 2022-01-20 DIAGNOSIS — E039 Hypothyroidism, unspecified: Secondary | ICD-10-CM

## 2022-01-25 ENCOUNTER — Other Ambulatory Visit: Payer: Self-pay | Admitting: Family Medicine

## 2022-01-25 DIAGNOSIS — E1165 Type 2 diabetes mellitus with hyperglycemia: Secondary | ICD-10-CM

## 2022-01-27 NOTE — Telephone Encounter (Signed)
Requested medication (s) are due for refill today: yes  Requested medication (s) are on the active medication list: yes  Last refill:  Jardiance: 07/29/21 #90 1 RF             Metformin: 07/16/21 #180 1 RF  Future visit scheduled: no  Notes to clinic:  called pt and LM on VM to call office to make appt   Requested Prescriptions  Pending Prescriptions Disp Refills   JARDIANCE 25 MG TABS tablet [Pharmacy Med Name: Jardiance 25 MG Oral Tablet] 90 tablet 0    Sig: TAKE 1 TABLET BY [place]     Endocrinology:  Diabetes - SGLT2 Inhibitors Failed - 01/25/2022  7:41 AM      Failed - Cr in normal range and within 360 days    Creatinine, Ser  Date Value Ref Range Status  01/01/2021 1.07 (H) 0.57 - 1.00 mg/dL Final         Failed - HBA1C is between 0 and 7.9 and within 180 days    Hemoglobin A1C  Date Value Ref Range Status  07/16/2021 6.4 (A) 4.0 - 5.6 % Final   Hgb A1c MFr Bld  Date Value Ref Range Status  04/12/2021 6.8 (H) 4.8 - 5.6 % Final    Comment:             Prediabetes: 5.7 - 6.4          Diabetes: >6.4          Glycemic control for adults with diabetes: <7.0          Failed - eGFR in normal range and within 360 days    GFR calc Af Amer  Date Value Ref Range Status  03/16/2020 59 (L) >59 mL/min/1.73 Final    Comment:    **In accordance with recommendations from the NKF-ASN Task force,**   [facility] is in the process of updating its eGFR calculation to the   2021 CKD-EPI creatinine equation that estimates kidney function   without a race variable.    GFR, Estimated  Date Value Ref Range Status  04/06/2020 51 (L) >60 mL/min Final    Comment:    (NOTE) Calculated using the CKD-EPI Creatinine Equation (2021)    eGFR  Date Value Ref Range Status  01/01/2021 61 >59 mL/min/1.73 Final         Failed - Valid encounter within last 6 months    Recent Outpatient Visits           6 months ago Type 2 diabetes mellitus with hyperglycemia,  without long-term current use of insulin [facility])   [facility] Tally Joe T, FNP   9 months ago Type 2 diabetes mellitus with hyperglycemia, without long-term current use of insulin [facility])   [facility] Gwyneth Sprout, FNP   1 year ago Encounter for annual physical exam   [facility], Dionne Bucy, MD   1 year ago New onset type 2 diabetes mellitus [facility])   [facility] [place], Clearnce Sorrel, [place]   1 year ago Pre-op evaluation   [facility] Fenton Malling M, [place]               metFORMIN (GLUCOPHAGE) 500 MG tablet [Pharmacy Med Name: metFORMIN HCl 500 MG Oral Tablet] 180 tablet 0    Sig: TAKE 1 TABLET BY MOUTH TWICE DAILY WITH A MEAL     Endocrinology:  Diabetes - Biguanides Failed - 01/25/2022  7:41 AM      Failed - Cr in normal range and within 360 days    Creatinine, Ser  Date Value Ref Range Status  01/01/2021 1.07 (H) 0.57 - 1.00 mg/dL Final         Failed - HBA1C is between 0 and 7.9 and within 180 days    Hemoglobin A1C  Date Value Ref Range Status  07/16/2021 6.4 (A) 4.0 - 5.6 % Final   Hgb A1c MFr Bld  Date Value Ref Range Status  04/12/2021 6.8 (H) 4.8 - 5.6 % Final    Comment:             Prediabetes: 5.7 - 6.4          Diabetes: >6.4          Glycemic control for adults with diabetes: <7.0          Failed - eGFR in normal range and within 360 days    GFR calc Af Amer  Date Value Ref Range Status  03/16/2020 59 (L) >59 mL/min/1.73 Final    Comment:    **In accordance with recommendations from the NKF-ASN Task force,**   [facility] is in the process of updating its eGFR calculation to the   2021 CKD-EPI creatinine equation that estimates kidney function   without a race variable.    GFR, Estimated  Date Value Ref Range Status  04/06/2020 51 (L) >60 mL/min Final    Comment:    (NOTE) Calculated using the CKD-EPI Creatinine Equation (2021)    eGFR  Date Value Ref Range  Status  01/01/2021 61 >59 mL/min/1.73 Final         Failed - B12 Level in normal range and within 720 days    Vitamin B-12  Date Value Ref Range Status  03/05/2016 638 232 - 1,245 pg/mL Final         Failed - Valid encounter within last 6 months    Recent Outpatient Visits           6 months ago Type 2 diabetes mellitus with hyperglycemia, without long-term current use of insulin [facility])   [facility] Tally Joe T, FNP   9 months ago Type 2 diabetes mellitus with hyperglycemia, without long-term current use of insulin [facility])   [facility] Gwyneth Sprout, FNP   1 year ago Encounter for annual physical exam   [facility], Dionne Bucy, MD   1 year ago New onset type 2 diabetes mellitus [facility])   [facility] [place], [place], [place]   1 year ago Pre-op evaluation   [place], Anderson Malta M, PA-C              Failed - CBC within normal limits and completed in the last 12 months    WBC  Date Value Ref Range Status  01/01/2021 7.7 3.4 - 10.8 x10E3/uL Final  04/06/2020 13.3 (H) 4.0 - 10.5 K/uL Final   RBC  Date Value Ref Range Status  01/01/2021 5.57 (H) 3.77 - 5.28 x10E6/uL Final  04/06/2020 4.23 3.87 - 5.11 MIL/uL Final   Hemoglobin  Date Value Ref Range Status  01/01/2021 17.0 (H) 11.1 - 15.9 g/dL Final   Hematocrit  Date Value Ref Range Status  01/01/2021 50.5 (H) 34.0 - 46.6 % Final   MCHC  Date Value Ref Range Status  01/01/2021 33.7 31.5 - 35.7 g/dL Final  04/06/2020 34.1 30.0 - 36.0 g/dL Final  [facility]  Date Value Ref Range Status  01/01/2021 30.5 26.6 - 33.0 pg Final  04/06/2020 30.5 26.0 - 34.0 pg Final   MCV  Date Value Ref Range Status  01/01/2021 91 79 - 97 fL Final   No results found for: "PLTCOUNTKUC", "LABPLAT", "POCPLA" RDW  Date Value Ref Range Status  01/01/2021 12.8 11.7 - 15.4 % Final

## 2022-01-29 ENCOUNTER — Encounter: Payer: Self-pay | Admitting: Family Medicine

## 2022-01-29 ENCOUNTER — Ambulatory Visit (INDEPENDENT_AMBULATORY_CARE_PROVIDER_SITE_OTHER): Payer: BC Managed Care – PPO | Admitting: Family Medicine

## 2022-01-29 VITALS — BP 103/90 | HR 85 | Temp 97.8°F | Wt 119.1 lb

## 2022-01-29 DIAGNOSIS — E1159 Type 2 diabetes mellitus with other circulatory complications: Secondary | ICD-10-CM | POA: Diagnosis not present

## 2022-01-29 DIAGNOSIS — E785 Hyperlipidemia, unspecified: Secondary | ICD-10-CM

## 2022-01-29 DIAGNOSIS — E039 Hypothyroidism, unspecified: Secondary | ICD-10-CM

## 2022-01-29 DIAGNOSIS — Z1231 Encounter for screening mammogram for malignant neoplasm of breast: Secondary | ICD-10-CM

## 2022-01-29 DIAGNOSIS — E1165 Type 2 diabetes mellitus with hyperglycemia: Secondary | ICD-10-CM | POA: Diagnosis not present

## 2022-01-29 DIAGNOSIS — I152 Hypertension secondary to endocrine disorders: Secondary | ICD-10-CM

## 2022-01-29 DIAGNOSIS — I700 Atherosclerosis of aorta: Secondary | ICD-10-CM

## 2022-01-29 DIAGNOSIS — E1169 Type 2 diabetes mellitus with other specified complication: Secondary | ICD-10-CM | POA: Diagnosis not present

## 2022-01-29 DIAGNOSIS — J449 Chronic obstructive pulmonary disease, unspecified: Secondary | ICD-10-CM

## 2022-01-29 NOTE — Progress Notes (Signed)
I,Connie R Striblin,acting as a Neurosurgeon for Jacky Kindle, FNP.,have documented all relevant documentation on the behalf of Jacky Kindle, FNP,as directed by  Jacky Kindle, FNP while in the presence of Jacky Kindle, FNP.  Established patient visit   [name]   DOB: 01-01-1966   56 y.o. Female  MRN: [id number] Visit Date: 01/29/2022  Today's healthcare provider: Jacky Kindle, FNP  Re Introduced to nurse practitioner role and practice setting.  All questions answered.  Discussed provider/patient relationship and expectations.   Chief Complaint  Patient presents with   Medication Refill   Subjective    HPI Follow up for Medication refills   The patient was last seen for this 6 months ago. Changes made at last visit include none.  She reports excellent compliance with treatment. She feels that condition is Unchanged. She is not having side effects.   Pt was here to request refills on Metformin and Levothyroxine. Pt medications were refilled today. Pt has no other questions or concerns.    -----------------------------------------------------------------------------------------   Medications: Outpatient Medications Prior to Visit  Medication Sig   albuterol (VENTOLIN HFA) 108 (90 Base) MCG/ACT inhaler Inhale 2 puffs into the lungs every 6 (six) hours as needed for wheezing or shortness of breath.   baclofen (LIORESAL) 0.05 MG/ML injection 1 mL (50 mcg total) by Intrathecal route once for 1 dose. In pain pump   buPROPion (WELLBUTRIN XL) 150 MG 24 hr tablet Take 1 tablet (150 mg total) by mouth daily.   Cholecalciferol 125 MCG (5000 UT) TABS Take 5,000 Units by mouth daily.   cloNIDine (CATAPRES) 0.1 MG tablet Take 1 tablet (0.1 mg total) by mouth 3 (three) times daily. In pain pump   empagliflozin (JARDIANCE) 25 MG TABS tablet TAKE 1 TABLET BY MOUTH ONCE DAILY BEFORE BREAKFAST   HYDROcodone-acetaminophen (NORCO) 5-325 MG tablet Take 1-2 tablets by mouth  every 4 (four) hours as needed for moderate pain or severe pain.   HYDROmorphone in sodium chloride 0.9 % 100 mL Inject into the vein continuous. In pain pump   levothyroxine (SYNTHROID) 100 MCG tablet levothyroxine 100 mcg tablet  TAKE 1 TABLET BY MOUTH ONCE DAILY APPOINTMENT REQUIRED FOR FUTURE REFILLS   levothyroxine (SYNTHROID) 112 MCG tablet one tablet (112 mcg dose).   levothyroxine (SYNTHROID) 112 MCG tablet Take 1 tablet by mouth once daily   Levothyroxine Sodium 125 MCG CAPS    metFORMIN (GLUCOPHAGE) 500 MG tablet Take 1.5 tablets (750 mg total) by mouth daily with breakfast.   naloxone (NARCAN) nasal spray 4 mg/0.1 mL Place into the nose.   rosuvastatin (CRESTOR) 5 MG tablet Take 1 tablet (5 mg total) by mouth daily.   triamterene-hydrochlorothiazide (DYAZIDE) 37.5-25 MG capsule Take 1 capsule by mouth once daily   No facility-administered medications prior to visit.    Review of Systems    Objective    BP (!) 103/90 (BP Location: Left Arm, Patient Position: Sitting, Cuff Size: Normal)   Pulse 85   Temp 97.8 F (36.6 C) (Oral)   Wt 119 lb 1.6 oz (54 kg)   SpO2 100%   BMI 20.44 kg/m   Physical Exam Vitals and nursing note reviewed.  Constitutional:      General: She is not in acute distress.    Appearance: Normal appearance. She is normal weight. She is not ill-appearing, toxic-appearing or diaphoretic.  HENT:     Head: Normocephalic and atraumatic.  Cardiovascular:  Rate and Rhythm: Normal rate and regular rhythm.     Pulses: Normal pulses.     Heart sounds: Normal heart sounds. No murmur heard.    No friction rub. No gallop.  Pulmonary:     Effort: Pulmonary effort is normal. No respiratory distress.     Breath sounds: Normal breath sounds. No stridor. No wheezing, rhonchi or rales.  Chest:     Chest wall: No tenderness.  Abdominal:     General: Bowel sounds are normal.     Palpations: Abdomen is soft.  Musculoskeletal:        General: No swelling,  tenderness, deformity or signs of injury. Normal range of motion.     Right lower leg: No edema.     Left lower leg: No edema.  Skin:    General: Skin is warm and dry.     Capillary Refill: Capillary refill takes less than 2 seconds.     Coloration: Skin is not jaundiced or pale.     Findings: No bruising, erythema, lesion or rash.  Neurological:     General: No focal deficit present.     Mental Status: She is alert and oriented to person, place, and time. Mental status is at baseline.     Cranial Nerves: No cranial nerve deficit.     Sensory: No sensory deficit.     Motor: No weakness.     Coordination: Coordination normal.  Psychiatric:        Mood and Affect: Mood normal.        Behavior: Behavior normal.        Thought Content: Thought content normal.        Judgment: Judgment normal.      No results found for any visits on 01/29/22.  Assessment & Plan     Problem List Items Addressed This Visit       Cardiovascular and Mediastinum   Aortic atherosclerosis (HCC)    Seen on mult imaging On statin- Crestor 5 mg; repeat LP LDL goal of 55-70 Encourage smoking cessation; pt reports 2-3 cigs/day/some days       Relevant Orders   Comprehensive Metabolic Panel (CMET)   Lipid panel   Hypertension associated with diabetes (HCC)    Chronic, borderline DBP at 90 today Goal <130/<80 Repeat CBC and CMP Continue dyazide to assist 37.5-25      Relevant Orders   CBC   Comprehensive Metabolic Panel (CMET)   Urine Microalbumin w/creat. ratio     Respiratory   COPD (chronic obstructive pulmonary disease) (HCC)    Chronic, stable Still smoking; 2-3 cigs/day/some days Working on further reduction with goal of cessation Use of rescue inhaler only; pt denies further medication assistance at this time Continue albuterol 2 puffs q6 prn        Endocrine   Acquired hypothyroidism    Chronic, stable On 100 mcg synthroid currently; repeat labs       Relevant Orders   TSH    Hyperlipidemia associated with type 2 diabetes mellitus (HCC)    Chronic, previously elevated Continue Crestor 5 mg Repeat LP LDL goal of 55-70 with known atherosclerosis       Relevant Orders   Urine Microalbumin w/creat. ratio   Lipid panel   Type 2 diabetes mellitus with hyperglycemia, without long-term current use of insulin (HCC) - Primary    Chronic, stable with diet and exercise Repeat A1c Continue to recommend balanced, lower carb meals. Smaller meal size, adding snacks. Choosing water  as drink of choice and increasing purposeful exercise. Plans to see Kavin Leech Visit in spring for DM eye exam       Relevant Orders   CBC   Comprehensive Metabolic Panel (CMET)   Urine Microalbumin w/creat. ratio     Other   Encounter for screening mammogram for malignant neoplasm of breast    Due for screening for mammogram, denies breast concerns, provided with phone number to call and schedule appointment for mammogram. Encouraged to repeat breast cancer screening every 1-2 years.       Relevant Orders   MM 3D SCREEN BREAST BILATERAL   Return in about 6 months (around 07/31/2022) for annual examination.     Leilani Merl, FNP, have reviewed all documentation for this visit. The documentation on 01/29/22 for the exam, diagnosis, procedures, and orders are all accurate and complete.  Jacky Kindle, FNP  [facility] [phone] (phone) [phone] (fax)  [facility]

## 2022-01-29 NOTE — Assessment & Plan Note (Signed)
Chronic, stable Still smoking; 2-3 cigs/day/some days Working on further reduction with goal of cessation Use of rescue inhaler only; pt denies further medication assistance at this time Continue albuterol 2 puffs q6 prn

## 2022-01-29 NOTE — Assessment & Plan Note (Addendum)
Chronic, stable with diet and exercise Repeat A1c Continue to recommend balanced, lower carb meals. Smaller meal size, adding snacks. Choosing water as drink of choice and increasing purposeful exercise. Plans to see Kavin Leech Visit in spring for DM eye exam

## 2022-01-29 NOTE — Assessment & Plan Note (Signed)
Chronic, stable On 100 mcg synthroid currently; repeat labs

## 2022-01-29 NOTE — Patient Instructions (Signed)
The CDC recommends two doses of Shingrix (the new shingles vaccine) separated by 2 to 6 months for adults age 58 years and older. I recommend checking with your insurance plan regarding coverage for this vaccine.    

## 2022-01-29 NOTE — Assessment & Plan Note (Signed)
Chronic, previously elevated Continue Crestor 5 mg Repeat LP LDL goal of 55-70 with known atherosclerosis

## 2022-01-29 NOTE — Assessment & Plan Note (Signed)
Due for screening for mammogram, denies breast concerns, provided with phone number to call and schedule appointment for mammogram. Encouraged to repeat breast cancer screening every 1-2 years.  

## 2022-01-29 NOTE — Assessment & Plan Note (Signed)
Chronic, borderline DBP at 90 today Goal <130/<80 Repeat CBC and CMP Continue dyazide to assist 37.5-25

## 2022-01-29 NOTE — Assessment & Plan Note (Signed)
Seen on mult imaging On statin- Crestor 5 mg; repeat LP LDL goal of 55-70 Encourage smoking cessation; pt reports 2-3 cigs/day/some days

## 2022-01-30 ENCOUNTER — Ambulatory Visit: Payer: BC Managed Care – PPO | Admitting: Family Medicine

## 2022-01-30 LAB — LIPID PANEL
Chol/HDL Ratio: 3.1 ratio (ref 0.0–4.4)
Cholesterol, Total: 162 mg/dL (ref 100–199)
HDL: 52 mg/dL (ref >39)
LDL Chol Calc (NIH): 93 mg/dL (ref 0–99)
Triglycerides: 91 mg/dL (ref 0–149)
VLDL Cholesterol Cal: 17 mg/dL (ref 5–40)

## 2022-01-30 LAB — CBC
Hematocrit: 51 % — ABNORMAL HIGH (ref 34.0–46.6)
Hemoglobin: 17.1 g/dL — ABNORMAL HIGH (ref 11.1–15.9)
MCH: 30.2 pg (ref 26.6–33.0)
MCHC: 33.5 g/dL (ref 31.5–35.7)
MCV: 90 fL (ref 79–97)
Platelets: 223 10*3/uL (ref 150–450)
RBC: 5.66 x10E6/uL — ABNORMAL HIGH (ref 3.77–5.28)
RDW: 13.1 % (ref 11.7–15.4)
WBC: 8.7 10*3/uL (ref 3.4–10.8)

## 2022-01-30 LAB — COMPREHENSIVE METABOLIC PANEL
ALT: 19 IU/L (ref 0–32)
AST: 16 IU/L (ref 0–40)
Albumin/Globulin Ratio: 2.3 — ABNORMAL HIGH (ref 1.2–2.2)
Albumin: 4.8 g/dL (ref 3.8–4.9)
Alkaline Phosphatase: 93 IU/L (ref 44–121)
BUN/Creatinine Ratio: 15 (ref 9–23)
BUN: 19 mg/dL (ref 6–24)
Bilirubin Total: 0.4 mg/dL (ref 0.0–1.2)
CO2: 26 mmol/L (ref 20–29)
Calcium: 9.5 mg/dL (ref 8.7–10.2)
Chloride: 100 mmol/L (ref 96–106)
Creatinine, Ser: 1.23 mg/dL — ABNORMAL HIGH (ref 0.57–1.00)
Globulin, Total: 2.1 g/dL (ref 1.5–4.5)
Glucose: 73 mg/dL (ref 70–99)
Potassium: 4.3 mmol/L (ref 3.5–5.2)
Sodium: 138 mmol/L (ref 134–144)
Total Protein: 6.9 g/dL (ref 6.0–8.5)
eGFR: 51 mL/min/{1.73_m2} — ABNORMAL LOW (ref >59)

## 2022-01-30 LAB — MICROALBUMIN / CREATININE URINE RATIO
Creatinine, Urine: 83.4 mg/dL
Microalb/Creat Ratio: 6 mg/g creat (ref 0–29)
Microalbumin, Urine: 5.3 ug/mL

## 2022-01-30 LAB — TSH: TSH: 13.4 u[IU]/mL — ABNORMAL HIGH (ref 0.450–4.500)

## 2022-01-30 NOTE — Progress Notes (Signed)
Urine is normal; repeat in 1 year.

## 2022-01-30 NOTE — Progress Notes (Signed)
Please call [facility]- appears that A1c sampling was missed on collection. See if they can add on.  TSH remains underfunctioning and overstimulated; recommend slight change in levothyroxine to assist.  Elevated RBC and Hgb remain; recommend blood donation and/or follow up with hematology to assist.  Decrease in kidney function noted from previous years. Continue to prioritize water to assist- goal of 48-64 oz/day. Recommend repeat labs in 2-3 months.  Cholesterol is improved. I continue to recommend diet low in saturated fat and regular exercise - 30 min at least 5 times per week  The 10-year ASCVD risk score (Arnett DK, et al., 2019) is: 8.6%   Values used to calculate the score:     Age: 56 years     Sex: Female     Is Non-Hispanic African American: No     Diabetic: Yes     Tobacco smoker: Yes     Systolic Blood Pressure: 103 mmHg     Is BP treated: Yes     HDL Cholesterol: 52 mg/dL     Total Cholesterol: 162 mg/dL  Urine is pending.

## 2022-01-31 ENCOUNTER — Telehealth: Payer: Self-pay | Admitting: Family Medicine

## 2022-01-31 NOTE — Telephone Encounter (Signed)
Pharmacy requesting refill of     metFORMIN (GLUCOPHAGE) 500 MG tablet

## 2022-02-04 ENCOUNTER — Other Ambulatory Visit: Payer: Self-pay | Admitting: Family Medicine

## 2022-02-04 DIAGNOSIS — E1165 Type 2 diabetes mellitus with hyperglycemia: Secondary | ICD-10-CM

## 2022-02-04 NOTE — Telephone Encounter (Signed)
Second request

## 2022-02-05 ENCOUNTER — Other Ambulatory Visit: Payer: Self-pay | Admitting: Family Medicine

## 2022-02-05 MED ORDER — METFORMIN HCL ER 500 MG PO TB24: 1000.0000 mg | ORAL_TABLET | Freq: Two times a day (BID) | ORAL | 0 refills | Status: DC

## 2022-02-05 NOTE — Progress Notes (Signed)
Increase in A1c noted; up 1% from labs 6 months ago. Continue to recommend balanced, lower carb meals. Smaller meal size, adding snacks. Choosing water as drink of choice and increasing purposeful exercise.  Recommend increase to 1000 mg Metformin twice daily with meals to assist further. A1c goal of <7%.  Please let us know if you have any questions.  Thank you, Jacky Kindle, FNP  [facility] [address] [place], [place] [address] [phone] (phone) [phone] (fax) [facility]

## 2022-02-06 ENCOUNTER — Other Ambulatory Visit: Payer: Self-pay | Admitting: Family Medicine

## 2022-02-11 LAB — HGB A1C W/O EAG: Hgb A1c MFr Bld: 7.4 % — ABNORMAL HIGH (ref 4.8–5.6)

## 2022-02-11 LAB — SPECIMEN STATUS REPORT

## 2022-03-04 ENCOUNTER — Other Ambulatory Visit: Payer: Self-pay | Admitting: Family Medicine

## 2022-03-05 DIAGNOSIS — M1610 Unilateral primary osteoarthritis, unspecified hip: Secondary | ICD-10-CM | POA: Diagnosis not present

## 2022-03-05 DIAGNOSIS — Z978 Presence of other specified devices: Secondary | ICD-10-CM | POA: Diagnosis not present

## 2022-03-05 DIAGNOSIS — Z791 Long term (current) use of non-steroidal anti-inflammatories (NSAID): Secondary | ICD-10-CM | POA: Diagnosis not present

## 2022-03-05 DIAGNOSIS — G894 Chronic pain syndrome: Secondary | ICD-10-CM | POA: Diagnosis not present

## 2022-03-05 DIAGNOSIS — Z79899 Other long term (current) drug therapy: Secondary | ICD-10-CM | POA: Diagnosis not present

## 2022-03-05 DIAGNOSIS — F419 Anxiety disorder, unspecified: Secondary | ICD-10-CM | POA: Diagnosis not present

## 2022-03-30 ENCOUNTER — Other Ambulatory Visit: Payer: Self-pay | Admitting: Family Medicine

## 2022-04-02 ENCOUNTER — Other Ambulatory Visit: Payer: Self-pay | Admitting: Family Medicine

## 2022-04-23 ENCOUNTER — Other Ambulatory Visit: Payer: Self-pay | Admitting: Family Medicine

## 2022-04-23 DIAGNOSIS — Z978 Presence of other specified devices: Secondary | ICD-10-CM | POA: Diagnosis not present

## 2022-04-23 DIAGNOSIS — M1610 Unilateral primary osteoarthritis, unspecified hip: Secondary | ICD-10-CM | POA: Diagnosis not present

## 2022-04-23 DIAGNOSIS — J449 Chronic obstructive pulmonary disease, unspecified: Secondary | ICD-10-CM

## 2022-04-23 DIAGNOSIS — F1190 Opioid use, unspecified, uncomplicated: Secondary | ICD-10-CM | POA: Diagnosis not present

## 2022-04-23 DIAGNOSIS — E039 Hypothyroidism, unspecified: Secondary | ICD-10-CM

## 2022-04-23 DIAGNOSIS — Z5181 Encounter for therapeutic drug level monitoring: Secondary | ICD-10-CM | POA: Diagnosis not present

## 2022-04-23 DIAGNOSIS — G894 Chronic pain syndrome: Secondary | ICD-10-CM | POA: Diagnosis not present

## 2022-04-23 DIAGNOSIS — Z79899 Other long term (current) drug therapy: Secondary | ICD-10-CM | POA: Diagnosis not present

## 2022-04-24 NOTE — Telephone Encounter (Signed)
Requested Prescriptions  Pending Prescriptions Disp Refills   albuterol (VENTOLIN HFA) 108 (90 Base) MCG/ACT inhaler [Pharmacy Med Name: Albuterol Sulfate HFA 108 (90 Base) MCG/ACT Inhalation Aerosol Solution] 18 g 0    Sig: INHALE 2 PUFFS BY MOUTH EVERY 6 HOURS AS NEEDED FOR WHEEZING FOR SHORTNESS OF BREATH     Pulmonology:  Beta Agonists 2 Failed - 04/23/2022 10:58 AM      Failed - Last BP in normal range    BP Readings from Last 1 Encounters:  01/29/22 (!) 103/90         Passed - Last Heart Rate in normal range    Pulse Readings from Last 1 Encounters:  01/29/22 85         Passed - Valid encounter within last 12 months    Recent Outpatient Visits           2 months ago Type 2 diabetes mellitus with hyperglycemia, without long-term current use of insulin ([place])   [place] Tally Joe T, FNP   9 months ago Type 2 diabetes mellitus with hyperglycemia, without long-term current use of insulin [facility])   [place] Tally Joe T, FNP   1 year ago Type 2 diabetes mellitus with hyperglycemia, without long-term current use of insulin ([place])   [place] Tally Joe T, FNP   1 year ago Encounter for annual physical exam   [place], Dionne Bucy, MD   2 years ago New onset type 2 diabetes mellitus [facility])   [place] Fenton Malling M, [place]               levothyroxine (SYNTHROID) 112 MCG tablet [Pharmacy Med Name: Levothyroxine Sodium 112 MCG Oral Tablet] 90 tablet 0    Sig: Take 1 tablet by mouth once daily     Endocrinology:  Hypothyroid Agents Failed - 04/23/2022 10:58 AM      Failed - TSH in normal range and within 360 days    TSH  Date Value Ref Range Status  01/29/2022 13.400 (H) 0.450 - 4.500 uIU/mL Final         Passed - Valid encounter within last 12 months    Recent Outpatient Visits           2 months ago Type 2  diabetes mellitus with hyperglycemia, without long-term current use of insulin [facility])   [place] Tally Joe T, FNP   9 months ago Type 2 diabetes mellitus with hyperglycemia, without long-term current use of insulin [facility])   [place] Tally Joe T, FNP   1 year ago Type 2 diabetes mellitus with hyperglycemia, without long-term current use of insulin [facility])   [place] Tally Joe T, FNP   1 year ago Encounter for annual physical exam   [place], Dionne Bucy, MD   2 years ago New onset type 2 diabetes mellitus [facility])   [place], [place], [place]

## 2022-05-05 ENCOUNTER — Other Ambulatory Visit: Payer: Self-pay | Admitting: Family Medicine

## 2022-05-30 ENCOUNTER — Other Ambulatory Visit: Payer: Self-pay | Admitting: Family Medicine

## 2022-06-02 ENCOUNTER — Telehealth: Payer: Self-pay | Admitting: Family Medicine

## 2022-06-03 ENCOUNTER — Ambulatory Visit (INDEPENDENT_AMBULATORY_CARE_PROVIDER_SITE_OTHER): Payer: BC Managed Care – PPO

## 2022-06-03 VITALS — Ht 64.0 in | Wt 119.0 lb

## 2022-06-03 DIAGNOSIS — Z0000 Encounter for general adult medical examination without abnormal findings: Secondary | ICD-10-CM | POA: Diagnosis not present

## 2022-06-03 DIAGNOSIS — Z1231 Encounter for screening mammogram for malignant neoplasm of breast: Secondary | ICD-10-CM

## 2022-06-03 NOTE — Progress Notes (Signed)
I connected with  ALEESHA RINGSTAD on 06/03/22 by a audio enabled telemedicine application and verified that I am speaking with the correct person using two identifiers.  Patient Location: Home  Provider Location: Office/Clinic  I discussed the limitations of evaluation and management by telemedicine. The patient expressed understanding and agreed to proceed.  Subjective:   LAYLAA GUEVARRA is a 57 y.o. female who presents for Medicare Annual (Subsequent) preventive examination.  Review of Systems    Cardiac Risk Factors include: dyslipidemia;hypertension;smoking/ tobacco exposure;sedentary lifestyle    Objective:    Today's Vitals   06/03/22 0532 06/03/22 0920  Weight:  119 lb (54 kg)  Height:   (1.626 m)  PainSc: 8     Body mass index is 20.43 kg/m.     06/03/2022    9:34 AM 05/30/2021    9:52 AM 05/15/2020    3:02 PM 04/05/2020    3:00 PM 03/29/2020   10:20 AM 02/11/2018    6:59 PM 02/11/2018   10:39 AM  Advanced Directives  Does Patient Have a Medical Advance Directive? No No No No No  No  Would patient like information on creating a medical advance directive?  No - Patient declined  No - Patient declined  No - Patient declined     Current Medications (verified) Outpatient Encounter Medications as of 06/03/2022  Medication Sig   albuterol (VENTOLIN HFA) 108 (90 Base) MCG/ACT inhaler INHALE 2 PUFFS BY MOUTH EVERY 6 HOURS AS NEEDED FOR WHEEZING FOR SHORTNESS OF BREATH   baclofen (LIORESAL) 0.05 MG/ML injection 1 mL (50 mcg total) by Intrathecal route once for 1 dose. In pain pump   buPROPion (WELLBUTRIN XL) 150 MG 24 hr tablet Take 1 tablet (150 mg total) by mouth daily.   Cholecalciferol 125 MCG (5000 UT) TABS Take 5,000 Units by mouth daily.   cloNIDine (CATAPRES) 0.1 MG tablet Take 1 tablet (0.1 mg total) by mouth 3 (three) times daily. In pain pump   empagliflozin (JARDIANCE) 25 MG TABS tablet Take 1 tablet (25 mg total) by mouth daily.   HYDROcodone-acetaminophen (NORCO)  5-325 MG tablet Take 1-2 tablets by mouth every 4 (four) hours as needed for moderate pain or severe pain.   HYDROmorphone in sodium chloride 0.9 % 100 mL Inject into the vein continuous. In pain pump   levothyroxine (SYNTHROID) 100 MCG tablet levothyroxine 100 mcg tablet  TAKE 1 TABLET BY MOUTH ONCE DAILY APPOINTMENT REQUIRED FOR FUTURE REFILLS   levothyroxine (SYNTHROID) 112 MCG tablet one tablet (112 mcg dose).   levothyroxine (SYNTHROID) 112 MCG tablet Take 1 tablet by mouth once daily   Levothyroxine Sodium 125 MCG CAPS    metFORMIN (GLUCOPHAGE-XR) 500 MG 24 hr tablet Take 2 tablets (1,000 mg total) by mouth 2 (two) times daily with a meal.   naloxone (NARCAN) nasal spray 4 mg/0.1 mL Place into the nose.   rosuvastatin (CRESTOR) 5 MG tablet Take 1 tablet (5 mg total) by mouth daily.   triamterene-hydrochlorothiazide (DYAZIDE) 37.5-25 MG capsule Take 1 capsule by mouth once daily   No facility-administered encounter medications on file as of 06/03/2022.    Allergies (verified) Morphine and Penicillins   History: Past Medical History:  Diagnosis Date   Arthritis    Chronic pain    COPD (chronic obstructive pulmonary disease)    Diabetes mellitus without complication    Dyspnea    GERD (gastroesophageal reflux disease)    Hypertension    Hypothyroidism    Pneumonia  Thyroid disease    Past Surgical History:  Procedure Laterality Date   ABDOMINAL HYSTERECTOMY  2001   CESAREAN SECTION  1994   JOINT REPLACEMENT Right 2005, 2010   hip   PAIN PUMP IMPLANTATION  2008, 2014   Dr Court Joy   TONSILLECTOMY  1970   TOTAL HIP REVISION Right 04/05/2020   Procedure: Revision Right total hip arthroplasty conversion of metal to ceramic with debridement;  Surgeon: Durene Romans, MD;  Location: [facility];  Service: Orthopedics;  Laterality: Right;  90 mins   Family History  Problem Relation Age of Onset   Diabetes Father    Breast cancer Neg Hx    Social History   Socioeconomic History    Marital status: Married    Spouse name: Not on file   Number of children: Not on file   Years of education: Not on file   Highest education level: Not on file  Occupational History   Not on file  Tobacco Use   Smoking status: Some Days    Packs/day: .25    Types: Cigarettes   Smokeless tobacco: Never  Vaping Use   Vaping Use: Never used  Substance and Sexual Activity   Alcohol use: No   Drug use: No   Sexual activity: Not Currently  Other Topics Concern   Not on file  Social History Narrative   Not on file   Social Determinants of Health   Financial Resource Strain: Low Risk  (06/03/2022)   Overall Financial Resource Strain (CARDIA)    Difficulty of Paying Living Expenses: Not very hard  Food Insecurity: No Food Insecurity (06/03/2022)   Hunger Vital Sign    Worried About Running Out of Food in the Last Year: Never true    Ran Out of Food in the Last Year: Never true  Transportation Needs: No Transportation Needs (06/03/2022)   PRAPARE - Administrator, Civil Service (Medical): No    Lack of Transportation (Non-Medical): No  Physical Activity: Unknown (06/03/2022)   Exercise Vital Sign    Days of Exercise per Week: Not on file    Minutes of Exercise per Session: 10 min  Stress: Stress Concern Present (06/03/2022)   [employer] of Occupational Health - Occupational Stress Questionnaire    Feeling of Stress : To some extent  Social Connections: Unknown (06/03/2022)   Social Connection and Isolation Panel [NHANES]    Frequency of Communication with Friends and Family: More than three times a week    Frequency of Social Gatherings with Friends and Family: More than three times a week    Attends Religious Services: Not on Marketing executive or Organizations: No    Attends Banker Meetings: Never    Marital Status: Married    Tobacco Counseling Ready to quit: Not Answered Counseling given: Not Answered  Clinical  Intake:  Pre-visit preparation completed: Yes  Pain : 0-10 Pain Score: 8  Pain Type: Chronic pain Pain Location: Hip (waist down pain) Pain Descriptors / Indicators: Aching Pain Onset: More than a month ago Pain Frequency: Constant Pain Relieving Factors: medication,ice  Pain Relieving Factors: medication,ice  BMI - recorded: 20.43 Nutritional Status: BMI of 19-24  Normal Nutritional Risks: None Diabetes: Yes  How often do you need to have someone help you when you read instructions, pamphlets, or other written materials from your doctor or pharmacy?: 1 - Never  Diabetic?yes  Interpreter Needed?: No  Comments: lives with husband Information  entered by :: B.Marsheila Alejo,LPN   Activities of Daily Living    06/03/2022    5:32 AM 01/29/2022    2:28 PM  In your present state of health, do you have any difficulty performing the following activities:  Hearing? 0 0  Vision? 0 1  Difficulty concentrating or making decisions? 0 1  Walking or climbing stairs? 1 1  Dressing or bathing? 0 0  Doing errands, shopping? 0 0  Preparing Food and eating ? N   Using the Toilet? N   In the past six months, have you accidently leaked urine? N   Do you have problems with loss of bowel control? N   Managing your Medications? N   Managing your Finances? N   Housekeeping or managing your Housekeeping? N     Patient Care Team: Jacky Kindle, FNP as PCP - General (Family Medicine)  Indicate any recent Medical Services you may have received from other than [facility] providers in the past year (date may be approximate).     Assessment:   This is a routine wellness examination for Makaelah.  Hearing/Vision screen Hearing Screening - Comments:: Adequate hearing Vision Screening - Comments:: Adequate vision w/glasses Arman Bogus  Dietary issues and exercise activities discussed: Current Exercise Habits: The patient has a physically strenuous job, but has no regular exercise apart  from work., Exercise limited by: orthopedic condition(s);neurologic condition(s)   Goals Addressed             This Visit's Progress    DIET - EAT MORE FRUITS AND VEGETABLES   On track    Quit Smoking   Not on track      Depression Screen    06/03/2022    9:26 AM 01/29/2022    2:28 PM 07/16/2021    9:26 AM 05/30/2021    9:51 AM 04/12/2021    9:17 AM 01/08/2021   10:36 AM 03/16/2020    1:55 PM  PHQ 2/9 Scores  PHQ - 2 Score 0 0 1 0 0 0 0  PHQ- 9 Score  7 10 3 9 2 3     Fall Risk    06/03/2022    5:32 AM 01/29/2022    2:28 PM 07/16/2021    9:26 AM 05/30/2021    9:53 AM 04/12/2021    9:17 AM  Fall Risk   Falls in the past year? 0 0 0 0 0  Number falls in past yr: 0 0 0 0   Injury with Fall? 0 0 0 0   Risk for fall due to :    No Fall Risks   Follow up    Falls evaluation completed     FALL RISK PREVENTION PERTAINING TO THE HOME:  Any stairs in or around the home? Yes  If so, are there any without handrails? No  Home free of loose throw rugs in walkways, pet beds, electrical cords, etc? Yes  Adequate lighting in your home to reduce risk of falls? Yes   ASSISTIVE DEVICES UTILIZED TO PREVENT FALLS:  Life alert? No  Use of a cane, walker or w/c? Yes cane Grab bars in the bathroom? No  Shower chair or bench in shower? No  Elevated toilet seat or a handicapped toilet? Yes   Cognitive Function:        06/03/2022    9:37 AM  6CIT Screen  What Year? 0 points  What month? 0 points  What time? 0 points  Count back from 20 0 points  Months in reverse 0 points  Repeat phrase 0 points  Total Score 0 points    Immunizations Immunization History  Administered Date(s) Administered   PFIZER(Purple Top)SARS-COV-2 Vaccination 05/17/2019, 06/07/2019   Pfizer Covid-19 Vaccine Bivalent Booster 5y-11y 02/15/2020   Td 10/16/2010   Tdap 10/16/2010, 01/08/2021    TDAP status: Up to date  Flu Vaccine status: Declined, Education has been provided regarding the importance of this  vaccine but patient still declined. Advised may receive this vaccine at local pharmacy or Health Dept. Aware to provide a copy of the vaccination record if obtained from local pharmacy or Health Dept. Verbalized acceptance and understanding.  Pneumococcal vaccine status: Declined,  Education has been provided regarding the importance of this vaccine but patient still declined. Advised may receive this vaccine at local pharmacy or Health Dept. Aware to provide a copy of the vaccination record if obtained from local pharmacy or Health Dept. Verbalized acceptance and understanding.   Covid-19 vaccine status: Completed vaccines  Qualifies for Shingles Vaccine? Yes   Zostavax completed No   Shingrix Completed?: No.    Education has been provided regarding the importance of this vaccine. Patient has been advised to call insurance company to determine out of pocket expense if they have not yet received this vaccine. Advised may also receive vaccine at local pharmacy or Health Dept. Verbalized acceptance and understanding.  Screening Tests Health Maintenance  Topic Date Due   Zoster Vaccines- Shingrix (1 of 2) Never done   PAP SMEAR-Modifier  Never done   MAMMOGRAM  07/07/2020   COVID-19 Vaccine (4 - 2023-24 season) 10/11/2021   OPHTHALMOLOGY EXAM  12/31/2021   FOOT EXAM  04/13/2022   Lung Cancer Screening  01/30/2023 (Originally 05/27/2013)   HEMOGLOBIN A1C  07/31/2022   INFLUENZA VACCINE  09/11/2022   Diabetic kidney evaluation - eGFR measurement  01/30/2023   Diabetic kidney evaluation - Urine ACR  01/30/2023   Medicare Annual Wellness (AWV)  06/03/2023   COLONOSCOPY (Pts 45-29yrs Insurance coverage will need to be confirmed)  07/12/2023   DTaP/Tdap/Td (4 - Td or Tdap) 01/09/2031   Hepatitis C Screening  Completed   HIV Screening  Completed   HPV VACCINES  Aged Out    Health Maintenance  Health Maintenance Due  Topic Date Due   Zoster Vaccines- Shingrix (1 of 2) Never done   PAP  SMEAR-Modifier  Never done   MAMMOGRAM  07/07/2020   COVID-19 Vaccine (4 - 2023-24 season) 10/11/2021   OPHTHALMOLOGY EXAM  12/31/2021   FOOT EXAM  04/13/2022    Colorectal cancer screening: Type of screening: Colonoscopy. Completed yes. Repeat every 10 years  Mammogram status: Ordered yes. Pt provided with contact info and advised to call to schedule appt.   Lung Cancer Screening: (Low Dose CT Chest recommended if Age 9-80 years, 30 pack-year currently smoking OR have quit w/in 15years.) does qualify.   Lung Cancer Screening Referral: no pt declines  Additional Screening:  Hepatitis C Screening: does not qualify; Completed yes  Vision Screening: Recommended annual ophthalmology exams for early detection of glaucoma and other disorders of the eye. Is the patient up to date with their annual eye exam?  Yes  Who is the provider or what is the name of the office in which the patient attends annual eye exams? [facility] If pt is not established with a provider, would they like to be referred to a provider to establish care? No .   Dental Screening: Recommended annual dental exams  for proper oral hygiene  Community Resource Referral / Chronic Care Management: CRR required this visit?  No   CCM required this visit?  No      Plan:     I have personally reviewed and noted the following in the patient's chart:   Medical and social history Use of alcohol, tobacco or illicit drugs  Current medications and supplements including opioid prescriptions. Patient is currently taking opioid prescriptions. Information provided to patient regarding non-opioid alternatives. Patient advised to discuss non-opioid treatment plan with their provider. Functional ability and status Nutritional status Physical activity Advanced directives List of other physicians Hospitalizations, surgeries, and ER visits in previous 12 months Vitals Screenings to include cognitive, depression, and  falls Referrals and appointments  In addition, I have reviewed and discussed with patient certain preventive protocols, quality metrics, and best practice recommendations. A written personalized care plan for preventive services as well as general preventive health recommendations were provided to patient.     Sue Lush, LPN   9/81/1914   Nurse Notes: pt states she is functioning at her baseline with chronic hip and leg pain. She continues to take oral pain medication and via pump and is managed by the Pain Clinic.Pt says her pain is 8 right now on pain scale as she is babysitting her 53month old grandchild. Pt has not had a PE since 11/22.   *Scheduled pt for PE in [id number]* *[facility] referral placed* *ADV directives mailed to pt per request

## 2022-06-03 NOTE — Patient Instructions (Signed)
Tasha Nichols , Thank you for taking time to come for your Medicare Wellness Visit. I appreciate your ongoing commitment to your health goals. Please review the following plan we discussed and let me know if I can assist you in the future.   These are the goals we discussed:  Goals      DIET - EAT MORE FRUITS AND VEGETABLES     Quit Smoking        This is a list of the screening recommended for you and due dates:  Health Maintenance  Topic Date Due   Zoster (Shingles) Vaccine (1 of 2) Never done   Pap Smear  Never done   Mammogram  07/07/2020   COVID-19 Vaccine (4 - 2023-24 season) 10/11/2021   Eye exam for diabetics  12/31/2021   Complete foot exam   04/13/2022   Screening for Lung Cancer  01/30/2023*   Hemoglobin A1C  07/31/2022   Flu Shot  09/11/2022   Yearly kidney function blood test for diabetes  01/30/2023   Yearly kidney health urinalysis for diabetes  01/30/2023   Medicare Annual Wellness Visit  06/03/2023   Colon Cancer Screening  07/12/2023   DTaP/Tdap/Td vaccine (4 - Td or Tdap) 01/09/2031   Hepatitis C Screening: USPSTF Recommendation to screen - Ages 75-79 yo.  Completed   HIV Screening  Completed   HPV Vaccine  Aged Out  *Topic was postponed. The date shown is not the original due date.    Advanced directives: no mailed paperwork to Tasha Nichols per request  Conditions/risks identified: low fall risk  Next appointment: Follow up in one year for your annual wellness visit. 06/08/2023 :45am telephone  Preventive Care 40-64 Years, Female Preventive care refers to lifestyle choices and visits with your health care provider that can promote health and wellness. What does preventive care include? A yearly physical exam. This is also called an annual well check. Dental exams once or twice a year. Routine eye exams. Ask your health care provider how often you should have your eyes checked. Personal lifestyle choices, including: Daily care of your teeth and gums. Regular  physical activity. Eating a healthy diet. Avoiding tobacco and drug use. Limiting alcohol use. Practicing safe sex. Taking low-dose aspirin daily starting at age 12. Taking vitamin and mineral supplements as recommended by your health care provider. What happens during an annual well check? The services and screenings done by your health care provider during your annual well check will depend on your age, overall health, lifestyle risk factors, and family history of disease. Counseling  Your health care provider may ask you questions about your: Alcohol use. Tobacco use. Drug use. Emotional well-being. Home and relationship well-being. Sexual activity. Eating habits. Work and work Astronomer. Method of birth control. Menstrual cycle. Pregnancy history. Screening  You may have the following tests or measurements: Height, weight, and BMI. Blood pressure. Lipid and cholesterol levels. These may be checked every 5 years, or more frequently if you are over 70 years old. Skin check. Lung cancer screening. You may have this screening every year starting at age 104 if you have a 30-pack-year history of smoking and currently smoke or have quit within the past 15 years. Fecal occult blood test (FOBT) of the stool. You may have this test every year starting at age 76. Flexible sigmoidoscopy or colonoscopy. You may have a sigmoidoscopy every 5 years or a colonoscopy every 10 years starting at age 21. Hepatitis C blood test. Hepatitis B blood test. Sexually  transmitted disease (STD) testing. Diabetes screening. This is done by checking your blood sugar (glucose) after you have not eaten for a while (fasting). You may have this done every 1-3 years. Mammogram. This may be done every 1-2 years. Talk to your health care provider about when you should start having regular mammograms. This may depend on whether you have a family history of breast cancer. BRCA-related cancer screening. This may be  done if you have a family history of breast, ovarian, tubal, or peritoneal cancers. Pelvic exam and Pap test. This may be done every 3 years starting at age 51. Starting at age 40, this may be done every 5 years if you have a Pap test in combination with an HPV test. Bone density scan. This is done to screen for osteoporosis. You may have this scan if you are at high risk for osteoporosis. Discuss your test results, treatment options, and if necessary, the need for more tests with your health care provider. Vaccines  Your health care provider may recommend certain vaccines, such as: Influenza vaccine. This is recommended every year. Tetanus, diphtheria, and acellular pertussis (Tdap, Td) vaccine. You may need a Td booster every 10 years. Zoster vaccine. You may need this after age 46. Pneumococcal 13-valent conjugate (PCV13) vaccine. You may need this if you have certain conditions and were not previously vaccinated. Pneumococcal polysaccharide (PPSV23) vaccine. You may need one or two doses if you smoke cigarettes or if you have certain conditions. Talk to your health care provider about which screenings and vaccines you need and how often you need them. This information is not intended to replace advice given to you by your health care provider. Make sure you discuss any questions you have with your health care provider. Document Released: 02/23/2015 Document Revised: 10/17/2015 Document Reviewed: 11/28/2014 Elsevier Interactive Patient Education  2017 [employer].    Fall Prevention in the Home Falls can cause injuries. They can happen to people of all ages. There are many things you can do to make your home safe and to help prevent falls. What can I do on the outside of my home? Regularly fix the edges of walkways and driveways and fix any cracks. Remove anything that might make you trip as you walk through a door, such as a raised step or threshold. Trim any bushes or trees on the path  to your home. Use bright outdoor lighting. Clear any walking paths of anything that might make someone trip, such as rocks or tools. Regularly check to see if handrails are loose or broken. Make sure that both sides of any steps have handrails. Any raised decks and porches should have guardrails on the edges. Have any leaves, snow, or ice cleared regularly. Use sand or salt on walking paths during winter. Clean up any spills in your garage right away. This includes oil or grease spills. What can I do in the bathroom? Use night lights. Install grab bars by the toilet and in the tub and shower. Do not use towel bars as grab bars. Use non-skid mats or decals in the tub or shower. If you need to sit down in the shower, use a plastic, non-slip stool. Keep the floor dry. Clean up any water that spills on the floor as soon as it happens. Remove soap buildup in the tub or shower regularly. Attach bath mats securely with double-sided non-slip rug tape. Do not have throw rugs and other things on the floor that can make you trip. What  can I do in the bedroom? Use night lights. Make sure that you have a light by your bed that is easy to reach. Do not use any sheets or blankets that are too big for your bed. They should not hang down onto the floor. Have a firm chair that has side arms. You can use this for support while you get dressed. Do not have throw rugs and other things on the floor that can make you trip. What can I do in the kitchen? Clean up any spills right away. Avoid walking on wet floors. Keep items that you use a lot in easy-to-reach places. If you need to reach something above you, use a strong step stool that has a grab bar. Keep electrical cords out of the way. Do not use floor polish or wax that makes floors slippery. If you must use wax, use non-skid floor wax. Do not have throw rugs and other things on the floor that can make you trip. What can I do with my stairs? Do not  leave any items on the stairs. Make sure that there are handrails on both sides of the stairs and use them. Fix handrails that are broken or loose. Make sure that handrails are as long as the stairways. Check any carpeting to make sure that it is firmly attached to the stairs. Fix any carpet that is loose or worn. Avoid having throw rugs at the top or bottom of the stairs. If you do have throw rugs, attach them to the floor with carpet tape. Make sure that you have a light switch at the top of the stairs and the bottom of the stairs. If you do not have them, ask someone to add them for you. What else can I do to help prevent falls? Wear shoes that: Do not have high heels. Have rubber bottoms. Are comfortable and fit you well. Are closed at the toe. Do not wear sandals. If you use a stepladder: Make sure that it is fully opened. Do not climb a closed stepladder. Make sure that both sides of the stepladder are locked into place. Ask someone to hold it for you, if possible. Clearly mark and make sure that you can see: Any grab bars or handrails. First and last steps. Where the edge of each step is. Use tools that help you move around (mobility aids) if they are needed. These include: Canes. Walkers. Scooters. Crutches. Turn on the lights when you go into a dark area. Replace any light bulbs as soon as they burn out. Set up your furniture so you have a clear path. Avoid moving your furniture around. If any of your floors are uneven, fix them. If there are any pets around you, be aware of where they are. Review your medicines with your doctor. Some medicines can make you feel dizzy. This can increase your chance of falling. Ask your doctor what other things that you can do to help prevent falls. This information is not intended to replace advice given to you by your health care provider. Make sure you discuss any questions you have with your health care provider. Document Released:  11/23/2008 Document Revised: 07/05/2015 Document Reviewed: 03/03/2014 Elsevier Interactive Patient Education  2017 [employer].

## 2022-06-17 DIAGNOSIS — Z978 Presence of other specified devices: Secondary | ICD-10-CM | POA: Diagnosis not present

## 2022-06-17 DIAGNOSIS — Z79899 Other long term (current) drug therapy: Secondary | ICD-10-CM | POA: Diagnosis not present

## 2022-06-17 DIAGNOSIS — Z791 Long term (current) use of non-steroidal anti-inflammatories (NSAID): Secondary | ICD-10-CM | POA: Diagnosis not present

## 2022-06-17 DIAGNOSIS — M1610 Unilateral primary osteoarthritis, unspecified hip: Secondary | ICD-10-CM | POA: Diagnosis not present

## 2022-06-17 DIAGNOSIS — F1190 Opioid use, unspecified, uncomplicated: Secondary | ICD-10-CM | POA: Diagnosis not present

## 2022-06-17 DIAGNOSIS — G894 Chronic pain syndrome: Secondary | ICD-10-CM | POA: Diagnosis not present

## 2022-06-21 ENCOUNTER — Other Ambulatory Visit: Payer: Self-pay | Admitting: Family Medicine

## 2022-06-21 DIAGNOSIS — J449 Chronic obstructive pulmonary disease, unspecified: Secondary | ICD-10-CM

## 2022-06-23 NOTE — Telephone Encounter (Signed)
Requested Prescriptions  Pending Prescriptions Disp Refills   albuterol (VENTOLIN HFA) 108 (90 Base) MCG/ACT inhaler [Pharmacy Med Name: Albuterol Sulfate HFA 108 (90 Base) MCG/ACT Inhalation Aerosol Solution] 18 g 0    Sig: INHALE 2 PUFFS BY MOUTH EVERY 6 HOURS AS NEEDED FOR WHEEZING FOR SHORTNESS OF BREATH     Pulmonology:  Beta Agonists 2 Failed - 06/21/2022  8:21 AM      Failed - Last BP in normal range    BP Readings from Last 1 Encounters:  01/29/22 (!) 103/90         Passed - Last Heart Rate in normal range    Pulse Readings from Last 1 Encounters:  01/29/22 85         Passed - Valid encounter within last 12 months    Recent Outpatient Visits           4 months ago Type 2 diabetes mellitus with hyperglycemia, without long-term current use of insulin (HCC)   [place] [facility] Merita Norton T, FNP   11 months ago Type 2 diabetes mellitus with hyperglycemia, without long-term current use of insulin [facility])   [place] [facility] Merita Norton T, FNP   1 year ago Type 2 diabetes mellitus with hyperglycemia, without long-term current use of insulin [facility])   [place] [facility] Merita Norton T, FNP   1 year ago Encounter for annual physical exam   [place] [facility] [place], Marzella Schlein, MD   2 years ago New onset type 2 diabetes mellitus [facility])   [facility] [place], Alessandra Bevels, [place]       Future Appointments             In 2 weeks Jacky Kindle, FNP [facility], [facility]

## 2022-06-25 ENCOUNTER — Ambulatory Visit
Admission: RE | Admit: 2022-06-25 | Discharge: 2022-06-25 | Disposition: A | Discharge status: Home / Self Care | Payer: BC Managed Care – PPO | Source: Ambulatory Visit | Attending: Family Medicine | Admitting: Family Medicine

## 2022-06-25 DIAGNOSIS — Z1231 Encounter for screening mammogram for malignant neoplasm of breast: Secondary | ICD-10-CM | POA: Insufficient documentation

## 2022-06-27 NOTE — Progress Notes (Signed)
Hi Solymar  Normal mammogram; repeat in 1 year.  Please let us know if you have any questions.  Thank you,  Seve Monette, FNP 

## 2022-07-09 DIAGNOSIS — E039 Hypothyroidism, unspecified: Secondary | ICD-10-CM | POA: Diagnosis not present

## 2022-07-09 DIAGNOSIS — R030 Elevated blood-pressure reading, without diagnosis of hypertension: Secondary | ICD-10-CM | POA: Diagnosis not present

## 2022-07-09 DIAGNOSIS — F419 Anxiety disorder, unspecified: Secondary | ICD-10-CM | POA: Diagnosis not present

## 2022-07-09 DIAGNOSIS — J449 Chronic obstructive pulmonary disease, unspecified: Secondary | ICD-10-CM | POA: Diagnosis not present

## 2022-07-09 DIAGNOSIS — E1122 Type 2 diabetes mellitus with diabetic chronic kidney disease: Secondary | ICD-10-CM | POA: Diagnosis not present

## 2022-07-09 DIAGNOSIS — G8929 Other chronic pain: Secondary | ICD-10-CM | POA: Diagnosis not present

## 2022-07-09 DIAGNOSIS — N1831 Chronic kidney disease, stage 3a: Secondary | ICD-10-CM | POA: Diagnosis not present

## 2022-07-09 DIAGNOSIS — F321 Major depressive disorder, single episode, moderate: Secondary | ICD-10-CM | POA: Diagnosis not present

## 2022-07-09 DIAGNOSIS — Z7984 Long term (current) use of oral hypoglycemic drugs: Secondary | ICD-10-CM | POA: Diagnosis not present

## 2022-07-11 ENCOUNTER — Encounter: Payer: Self-pay | Admitting: Family Medicine

## 2022-07-11 ENCOUNTER — Ambulatory Visit (INDEPENDENT_AMBULATORY_CARE_PROVIDER_SITE_OTHER): Payer: BC Managed Care – PPO | Admitting: Family Medicine

## 2022-07-11 VITALS — BP 113/80 | HR 82 | Ht 64.5 in | Wt 115.0 lb

## 2022-07-11 DIAGNOSIS — I700 Atherosclerosis of aorta: Secondary | ICD-10-CM

## 2022-07-11 DIAGNOSIS — E039 Hypothyroidism, unspecified: Secondary | ICD-10-CM

## 2022-07-11 DIAGNOSIS — Z0000 Encounter for general adult medical examination without abnormal findings: Secondary | ICD-10-CM | POA: Diagnosis not present

## 2022-07-11 DIAGNOSIS — D751 Secondary polycythemia: Secondary | ICD-10-CM

## 2022-07-11 DIAGNOSIS — F17200 Nicotine dependence, unspecified, uncomplicated: Secondary | ICD-10-CM

## 2022-07-11 DIAGNOSIS — J449 Chronic obstructive pulmonary disease, unspecified: Secondary | ICD-10-CM

## 2022-07-11 DIAGNOSIS — E1159 Type 2 diabetes mellitus with other circulatory complications: Secondary | ICD-10-CM | POA: Diagnosis not present

## 2022-07-11 DIAGNOSIS — E1165 Type 2 diabetes mellitus with hyperglycemia: Secondary | ICD-10-CM | POA: Diagnosis not present

## 2022-07-11 DIAGNOSIS — F339 Major depressive disorder, recurrent, unspecified: Secondary | ICD-10-CM

## 2022-07-11 DIAGNOSIS — I152 Hypertension secondary to endocrine disorders: Secondary | ICD-10-CM

## 2022-07-11 DIAGNOSIS — E1169 Type 2 diabetes mellitus with other specified complication: Secondary | ICD-10-CM | POA: Diagnosis not present

## 2022-07-11 DIAGNOSIS — E785 Hyperlipidemia, unspecified: Secondary | ICD-10-CM

## 2022-07-11 DIAGNOSIS — Z96649 Presence of unspecified artificial hip joint: Secondary | ICD-10-CM

## 2022-07-11 NOTE — Assessment & Plan Note (Signed)
Chronic, borderline Goal 129/79 Previously maintained with use of dyazide 37.5-25

## 2022-07-11 NOTE — Assessment & Plan Note (Signed)
Chronic, previously elevated with hyperglycemia A1c goal of <7% Continue to recommend balanced, lower carb meals. Smaller meal size, adding snacks. Choosing water as drink of choice and increasing purposeful exercise. Remains on 2000 mg metformin daily- 1000 mg BID as well as jardiance 25 mg given ASCVD Repeat A1c Due for DM foot exam and eye exam

## 2022-07-11 NOTE — Assessment & Plan Note (Signed)
Chronic, variable Previously alternating between 112 and 125 mcg synthroid daily Endorses anxiety and some sleep concerns; believes iso menopause

## 2022-07-11 NOTE — Assessment & Plan Note (Signed)
Chronic, previously elevated On crestor 5 mg Repeat LP ASCVD risk elevated Continue to work on BP control as well as smoking reduction for goal of cessation  recommend diet low in saturated fat and regular exercise - 30 min at least 5 times per week

## 2022-07-11 NOTE — Assessment & Plan Note (Signed)
Chronic, worsening Continues to decline concern for use of medication to assist; previously maintained with use of wellbutrin 150 mg; pt self d/c with weight loss Body mass index is 19.43 kg/m. Continue to monitor Verbal binding contract for low risk for SI or HI

## 2022-07-11 NOTE — Assessment & Plan Note (Signed)
Due for vision Due for dental UTD on mammo Things to do to keep yourself healthy  - Exercise at least 30-45 minutes a day, 3-4 days a week.  - Eat a low-fat diet with lots of fruits and vegetables, up to 7-9 servings per day.  - Seatbelts can save your life. Wear them always.  - Smoke detectors on every level of your home, check batteries every year.  - Eye Doctor - have an eye exam every 1-2 years  - Safe sex - if you may be exposed to STDs, use a condom.  - Alcohol -  If you drink, do it moderately, less than 2 drinks per day.  - Health Care Power of Attorney. Choose someone to speak for you if you are not able.  - Depression is common in our stressful world.If you're feeling down or losing interest in things you normally enjoy, please come in for a visit.  - Violence - If anyone is threatening or hurting you, please call immediately.

## 2022-07-11 NOTE — Progress Notes (Signed)
Complete physical exam   [name]   DOB: Feb 08, 1966   57 y.o. Female  MRN: [id number] Visit Date: 07/11/2022  Today's healthcare provider: Jacky Kindle, FNP   Chief Complaint  Patient presents with   Annual Exam   Subjective    Tasha Nichols is a 57 y.o. female who presents today for a complete physical exam.  She reports consuming a general diet. The patient does not participate in regular exercise at present. She generally feels fairly well. She reports sleeping fairly well. She does have additional problems to discuss today.  HPI   Past Medical History:  Diagnosis Date   Arthritis    Chronic pain    COPD (chronic obstructive pulmonary disease) (HCC)    Diabetes mellitus without complication (HCC)    Dyspnea    GERD (gastroesophageal reflux disease)    Hypertension    Hypothyroidism    Pneumonia    Thyroid disease    Past Surgical History:  Procedure Laterality Date   ABDOMINAL HYSTERECTOMY  02/11/1999   CESAREAN SECTION  02/11/1992   JOINT REPLACEMENT Right 2005, 2010   hip   PAIN PUMP IMPLANTATION  2008, 2014   Dr Court Joy   ROOT CANAL Left    04/2021   TONSILLECTOMY  02/11/1968   TOTAL HIP REVISION Right 04/05/2020   Procedure: Revision Right total hip arthroplasty conversion of metal to ceramic with debridement;  Surgeon: Durene Romans, MD;  Location: [facility];  Service: Orthopedics;  Laterality: Right;  90 mins   Social History   Socioeconomic History   Marital status: Married    Spouse name: Not on file   Number of children: Not on file   Years of education: Not on file   Highest education level: Not on file  Occupational History   Not on file  Tobacco Use   Smoking status: Some Days    Packs/day: .25    Types: Cigarettes   Smokeless tobacco: Never   Tobacco comments:    3-4 a week.  Vaping Use   Vaping Use: Never used  Substance and Sexual Activity   Alcohol use: No   Drug use: No   Sexual activity: Not Currently  Other  Topics Concern   Not on file  Social History Narrative   Not on file   Social Determinants of Health   Financial Resource Strain: Low Risk  (06/03/2022)   Overall Financial Resource Strain (CARDIA)    Difficulty of Paying Living Expenses: Not very hard  Food Insecurity: No Food Insecurity (06/03/2022)   Hunger Vital Sign    Worried About Running Out of Food in the Last Year: Never true    Ran Out of Food in the Last Year: Never true  Transportation Needs: No Transportation Needs (06/03/2022)   PRAPARE - Administrator, Civil Service (Medical): No    Lack of Transportation (Non-Medical): No  Physical Activity: Unknown (06/03/2022)   Exercise Vital Sign    Days of Exercise per Week: Not on file    Minutes of Exercise per Session: 10 min  Stress: Stress Concern Present (06/03/2022)   [employer] of Occupational Health - Occupational Stress Questionnaire    Feeling of Stress : To some extent  Social Connections: Unknown (06/03/2022)   Social Connection and Isolation Panel [NHANES]    Frequency of Communication with Friends and Family: More than three times a week    Frequency of Social Gatherings with Friends and Family:  More than three times a week    Attends Religious Services: Not on file    Active Member of Clubs or Organizations: No    Attends Club or Organization Meetings: Never    Marital Status: Married  Catering manager Violence: Not At Risk (06/03/2022)   Humiliation, Afraid, Rape, and Kick questionnaire    Fear of Current or Ex-Partner: No    Emotionally Abused: No    Physically Abused: No    Sexually Abused: No   Family Status  Relation Name Status   Mother  Alive   Father  Deceased   Neg Hx  (Not Specified)   Family History  Problem Relation Age of Onset   Diabetes Father    Breast cancer Neg Hx    Allergies  Allergen Reactions   Morphine Itching and Nausea And Vomiting   Penicillins Rash    Tolerated Cephalosporin Date: 04/05/20.       Patient Care Team: Jacky Kindle, FNP as PCP - General (Family Medicine)   Medications: Outpatient Medications Prior to Visit  Medication Sig   albuterol (VENTOLIN HFA) 108 (90 Base) MCG/ACT inhaler INHALE 2 PUFFS BY MOUTH EVERY 6 HOURS AS NEEDED FOR WHEEZING FOR SHORTNESS OF BREATH   baclofen (LIORESAL) 0.05 MG/ML injection 1 mL (50 mcg total) by Intrathecal route once for 1 dose. In pain pump   buPROPion (WELLBUTRIN XL) 150 MG 24 hr tablet Take 1 tablet (150 mg total) by mouth daily.   Cholecalciferol 125 MCG (5000 UT) TABS Take 5,000 Units by mouth daily.   cloNIDine (CATAPRES) 0.1 MG tablet Take 1 tablet (0.1 mg total) by mouth 3 (three) times daily. In pain pump   empagliflozin (JARDIANCE) 25 MG TABS tablet Take 1 tablet (25 mg total) by mouth daily.   HYDROcodone-acetaminophen (NORCO) 5-325 MG tablet Take 1-2 tablets by mouth every 4 (four) hours as needed for moderate pain or severe pain.   HYDROmorphone in sodium chloride 0.9 % 100 mL Inject into the vein continuous. In pain pump   metFORMIN (GLUCOPHAGE-XR) 500 MG 24 hr tablet Take 2 tablets (1,000 mg total) by mouth 2 (two) times daily with a meal.   naloxone (NARCAN) nasal spray 4 mg/0.1 mL Place into the nose.   [DISCONTINUED] levothyroxine (SYNTHROID) 100 MCG tablet levothyroxine 100 mcg tablet  TAKE 1 TABLET BY MOUTH ONCE DAILY APPOINTMENT REQUIRED FOR FUTURE REFILLS   [DISCONTINUED] levothyroxine (SYNTHROID) 112 MCG tablet one tablet (112 mcg dose).   [DISCONTINUED] levothyroxine (SYNTHROID) 112 MCG tablet Take 1 tablet by mouth once daily   [DISCONTINUED] Levothyroxine Sodium 125 MCG CAPS    [DISCONTINUED] rosuvastatin (CRESTOR) 5 MG tablet Take 1 tablet (5 mg total) by mouth daily.   [DISCONTINUED] triamterene-hydrochlorothiazide (DYAZIDE) 37.5-25 MG capsule Take 1 capsule by mouth once daily   No facility-administered medications prior to visit.    Review of Systems  Last CBC Lab Results  Component Value Date   WBC  8.7 01/29/2022   HGB 17.1 (H) 01/29/2022   HCT 51.0 (H) 01/29/2022   MCV 90 01/29/2022   MCH 30.2 01/29/2022   RDW 13.1 01/29/2022   PLT 223 01/29/2022   Last metabolic panel Lab Results  Component Value Date   GLUCOSE 73 01/29/2022   NA 138 01/29/2022   K 4.3 01/29/2022   CL 100 01/29/2022   CO2 26 01/29/2022   BUN 19 01/29/2022   CREATININE 1.23 (H) 01/29/2022   EGFR 51 (L) 01/29/2022   CALCIUM 9.5 01/29/2022   PROT  6.9 01/29/2022   ALBUMIN 4.8 01/29/2022   LABGLOB 2.1 01/29/2022   AGRATIO 2.3 (H) 01/29/2022   BILITOT 0.4 01/29/2022   ALKPHOS 93 01/29/2022   AST 16 01/29/2022   ALT 19 01/29/2022   ANIONGAP 9 04/06/2020   Last lipids Lab Results  Component Value Date   CHOL 162 01/29/2022   HDL 52 01/29/2022   LDLCALC 93 01/29/2022   TRIG 91 01/29/2022   CHOLHDL 3.1 01/29/2022   Last hemoglobin A1c Lab Results  Component Value Date   HGBA1C 7.4 (H) 01/29/2022   Last thyroid functions Lab Results  Component Value Date   TSH 13.400 (H) 01/29/2022   T4TOTAL 7.0 03/11/2019      Objective    BP 113/80 (BP Location: Right Arm, Patient Position: Sitting, Cuff Size: Normal)   Pulse 82   Ht 5' 4.5" (1.638 m)   Wt 115 lb (52.2 kg)   SpO2 98%   BMI 19.43 kg/m  BP Readings from Last 3 Encounters:  07/11/22 113/80  01/29/22 (!) 103/90  07/16/21 128/75   Wt Readings from Last 3 Encounters:  07/11/22 115 lb (52.2 kg)  06/03/22 119 lb (54 kg)  01/29/22 119 lb 1.6 oz (54 kg)   SpO2 Readings from Last 3 Encounters:  07/11/22 98%  01/29/22 100%  04/12/21 96%       Physical Exam Vitals and nursing note reviewed.  Constitutional:      General: She is awake. She is not in acute distress.    Appearance: Normal appearance. She is well-developed, well-groomed and normal weight. She is not ill-appearing, toxic-appearing or diaphoretic.  HENT:     Head: Normocephalic and atraumatic.     Jaw: There is normal jaw occlusion. No trismus, tenderness, swelling or  pain on movement.     Right Ear: Hearing, tympanic membrane, ear canal and external ear normal. There is no impacted cerumen.     Left Ear: Hearing, tympanic membrane, ear canal and external ear normal. There is no impacted cerumen.     Nose: Nose normal. No congestion or rhinorrhea.     Right Turbinates: Not enlarged, swollen or pale.     Left Turbinates: Not enlarged, swollen or pale.     Right Sinus: No maxillary sinus tenderness or frontal sinus tenderness.     Left Sinus: No maxillary sinus tenderness or frontal sinus tenderness.     Mouth/Throat:     Lips: Pink.     Mouth: Mucous membranes are moist. No injury.     Tongue: No lesions.     Pharynx: Oropharynx is clear. Uvula midline. No pharyngeal swelling, oropharyngeal exudate, posterior oropharyngeal erythema or uvula swelling.     Tonsils: No tonsillar exudate or tonsillar abscesses.  Eyes:     General: Lids are normal. Lids are everted, no foreign bodies appreciated. Vision grossly intact. Gaze aligned appropriately. No allergic shiner or visual field deficit.       Right eye: No discharge.        Left eye: No discharge.     Extraocular Movements: Extraocular movements intact.     Conjunctiva/sclera: Conjunctivae normal.     Right eye: Right conjunctiva is not injected. No exudate.    Left eye: Left conjunctiva is not injected. No exudate.    Pupils: Pupils are equal, round, and reactive to light.  Neck:     Thyroid: No thyroid mass, thyromegaly or thyroid tenderness.     Vascular: No carotid bruit.     Trachea: Trachea  normal.  Cardiovascular:     Rate and Rhythm: Normal rate and regular rhythm.     Pulses: Normal pulses.          Carotid pulses are 2+ on the right side and 2+ on the left side.      Radial pulses are 2+ on the right side and 2+ on the left side.       Dorsalis pedis pulses are 2+ on the right side and 2+ on the left side.       Posterior tibial pulses are 2+ on the right side and 2+ on the left side.      Heart sounds: Normal heart sounds, S1 normal and S2 normal. No murmur heard.    No friction rub. No gallop.  Pulmonary:     Effort: Pulmonary effort is normal. No respiratory distress.     Breath sounds: Normal breath sounds and air entry. No stridor. No wheezing, rhonchi or rales.  Chest:     Chest wall: No tenderness.  Abdominal:     General: Abdomen is flat. Bowel sounds are normal. There is no distension.     Palpations: Abdomen is soft. There is no mass.     Tenderness: There is no abdominal tenderness. There is no right CVA tenderness, left CVA tenderness, guarding or rebound.     Hernia: No hernia is present.  Genitourinary:    Comments: Exam deferred; denies complaints Musculoskeletal:        General: No swelling, tenderness, deformity or signs of injury. Normal range of motion.     Cervical back: Full passive range of motion without pain, normal range of motion and neck supple. No edema, rigidity or tenderness. No muscular tenderness.     Right lower leg: No edema.     Left lower leg: No edema.  Lymphadenopathy:     Cervical: No cervical adenopathy.     Right cervical: No superficial, deep or posterior cervical adenopathy.    Left cervical: No superficial, deep or posterior cervical adenopathy.  Skin:    General: Skin is warm and dry.     Capillary Refill: Capillary refill takes less than 2 seconds.     Coloration: Skin is not jaundiced or pale.     Findings: No bruising, erythema, lesion or rash.     Comments: Bronzed skin iso chronic Hgb elevation; continue to monitor CBC  Neurological:     General: No focal deficit present.     Mental Status: She is alert and oriented to person, place, and time. Mental status is at baseline.     GCS: GCS eye subscore is 4. GCS verbal subscore is 5. GCS motor subscore is 6.     Sensory: Sensation is intact. No sensory deficit.     Motor: Motor function is intact. No weakness.     Coordination: Coordination is intact. Coordination normal.      Gait: Gait is intact. Gait normal.  Psychiatric:        Attention and Perception: Attention and perception normal.        Mood and Affect: Mood and affect normal.        Speech: Speech normal.        Behavior: Behavior normal. Behavior is cooperative.        Thought Content: Thought content normal.        Cognition and Memory: Cognition and memory normal.        Judgment: Judgment normal.     Last depression screening  scores    07/11/2022    8:46 AM 06/03/2022    9:26 AM 01/29/2022    2:28 PM  PHQ 2/9 Scores  PHQ - 2 Score 0 0 0  PHQ- 9 Score 11  7   Last fall risk screening    07/11/2022    8:46 AM  Fall Risk   Falls in the past year? 0  Number falls in past yr: 0  Injury with Fall? 0   Last Audit-C alcohol use screening    07/11/2022    8:46 AM  Alcohol Use Disorder Test (AUDIT)  1. How often do you have a drink containing alcohol? 0   A score of 3 or more in women, and 4 or more in men indicates increased risk for alcohol abuse, EXCEPT if all of the points are from question 1   No results found for any visits on 07/11/22.  Assessment & Plan    Routine Health Maintenance and Physical Exam  Exercise Activities and Dietary recommendations  Goals      DIET - EAT MORE FRUITS AND VEGETABLES     Quit Smoking        Immunization History  Administered Date(s) Administered   PFIZER(Purple Top)SARS-COV-2 Vaccination 05/17/2019, 06/07/2019   Pfizer Covid-19 Vaccine Bivalent Booster 5y-11y 02/15/2020   Td 10/16/2010   Tdap 10/16/2010, 01/08/2021    Health Maintenance  Topic Date Due   Zoster Vaccines- Shingrix (1 of 2) Never done   COVID-19 Vaccine (4 - 2023-24 season) 10/11/2021   OPHTHALMOLOGY EXAM  12/31/2021   FOOT EXAM  04/13/2022   Lung Cancer Screening  01/30/2023 (Originally 05/27/2013)   HEMOGLOBIN A1C  07/31/2022   INFLUENZA VACCINE  09/11/2022   Diabetic kidney evaluation - eGFR measurement  01/30/2023   Diabetic kidney evaluation - Urine ACR   01/30/2023   Medicare Annual Wellness (AWV)  06/03/2023   Colonoscopy  07/12/2023   MAMMOGRAM  06/24/2024   DTaP/Tdap/Td (4 - Td or Tdap) 01/09/2031   Hepatitis C Screening  Completed   HIV Screening  Completed   HPV VACCINES  Aged Out   PAP SMEAR-Modifier  Discontinued    Discussed health benefits of physical activity, and encouraged her to engage in regular exercise appropriate for her age and condition.  Problem List Items Addressed This Visit       Cardiovascular and Mediastinum   Aortic atherosclerosis (HCC)    Seen on mult imaging Previously maintained on Crestor 5 mg; repeat LP LDL goal of 55-70 Encourage smoking cessation; pt reports 5 cigs/day/some days up from previous report The 10-year ASCVD risk score (Arnett DK, et al., 2019) is: 11.1%       Hypertension associated with diabetes (HCC)    Chronic, borderline Goal 129/79 Previously maintained with use of dyazide 37.5-25        Respiratory   COPD (chronic obstructive pulmonary disease) (HCC)    Chronic, stable Continues to use PRN albuterol  Declines further assistance in tobacco reduction for goal of cessation         Endocrine   Acquired hypothyroidism    Chronic, variable Previously alternating between 112 and 125 mcg synthroid daily Endorses anxiety and some sleep concerns; believes iso menopause       Hyperlipidemia associated with type 2 diabetes mellitus (HCC)    Chronic, previously elevated On crestor 5 mg Repeat LP ASCVD risk elevated Continue to work on BP control as well as smoking reduction for goal of cessation  recommend diet low  in saturated fat and regular exercise - 30 min at least 5 times per week       Type 2 diabetes mellitus with hyperglycemia, without long-term current use of insulin (HCC)    Chronic, previously elevated with hyperglycemia A1c goal of <7% Continue to recommend balanced, lower carb meals. Smaller meal size, adding snacks. Choosing water as drink of choice  and increasing purposeful exercise. Remains on 2000 mg metformin daily- 1000 mg BID as well as jardiance 25 mg given ASCVD Repeat A1c Due for DM foot exam and eye exam         Other   Acquired polycythemia    Chronic, stable Bronzed skin tone noted; continue to monitor labs and f/u with hematology as needed       Annual physical exam - Primary    Due for vision Due for dental UTD on mammo Things to do to keep yourself healthy  - Exercise at least 30-45 minutes a day, 3-4 days a week.  - Eat a low-fat diet with lots of fruits and vegetables, up to 7-9 servings per day.  - Seatbelts can save your life. Wear them always.  - Smoke detectors on every level of your home, check batteries every year.  - Eye Doctor - have an eye exam every 1-2 years  - Safe sex - if you may be exposed to STDs, use a condom.  - Alcohol -  If you drink, do it moderately, less than 2 drinks per day.  - Health Care Power of Attorney. Choose someone to speak for you if you are not able.  - Depression is common in our stressful world.If you're feeling down or losing interest in things you normally enjoy, please come in for a visit.  - Violence - If anyone is threatening or hurting you, please call immediately.       Relevant Orders   CBC   Comprehensive Metabolic Panel (CMET)   TSH   Hemoglobin A1c   Lipid panel   Compulsive tobacco user syndrome    Chronic, worsening Continues to decline assistance with reduction for cessation assistance       Depression, recurrent (HCC)    Chronic, worsening Continues to decline concern for use of medication to assist; previously maintained with use of wellbutrin 150 mg; pt self d/c with weight loss Body mass index is 19.43 kg/m. Continue to monitor Verbal binding contract for low risk for SI or HI      S/P revision of total hip    Ongoing R pain; worse since weight loss Use of baclofen and clonidine into pain pump Also previously on norco to assist and HM  pain pump       Return in about 3 months (around 10/11/2022) for chonic disease management.    Leilani Merl, FNP, have reviewed all documentation for this visit. The documentation on 07/11/22 for the exam, diagnosis, procedures, and orders are all accurate and complete.  Jacky Kindle, FNP  [facility] [phone] (phone) [phone] (fax)  [facility]

## 2022-07-11 NOTE — Patient Instructions (Signed)
The CDC recommends two doses of Shingrix (the new shingles vaccine) separated by 2 to 6 months for adults age 59 years and older. I recommend checking with your insurance plan regarding coverage for this vaccine.    Please schedule your DM eye exam as you are overdue.

## 2022-07-11 NOTE — Assessment & Plan Note (Signed)
Ongoing R pain; worse since weight loss Use of baclofen and clonidine into pain pump Also previously on norco to assist and HM pain pump

## 2022-07-11 NOTE — Assessment & Plan Note (Signed)
Chronic, stable Bronzed skin tone noted; continue to monitor labs and f/u with hematology as needed

## 2022-07-11 NOTE — Assessment & Plan Note (Signed)
Chronic, worsening Continues to decline assistance with reduction for cessation assistance

## 2022-07-11 NOTE — Assessment & Plan Note (Signed)
Seen on mult imaging Previously maintained on Crestor 5 mg; repeat LP LDL goal of 55-70 Encourage smoking cessation; pt reports 5 cigs/day/some days up from previous report The 10-year ASCVD risk score (Arnett DK, et al., 2019) is: 11.1%

## 2022-07-11 NOTE — Assessment & Plan Note (Signed)
Chronic, stable Continues to use PRN albuterol  Declines further assistance in tobacco reduction for goal of cessation

## 2022-07-13 NOTE — Progress Notes (Signed)
Kidney function is improved; however, liver enzymes and red blood cells remain elevated. TSH is stabilized. LDL is better; however, the 10-year ASCVD risk score (Arnett DK, et al., 2019) is: 11.8%. Continue to recommend goal of statin if desired.

## 2022-07-15 ENCOUNTER — Other Ambulatory Visit: Payer: Self-pay | Admitting: Family Medicine

## 2022-07-15 LAB — COMPREHENSIVE METABOLIC PANEL
ALT: 19 IU/L (ref 0–32)
AST: 20 IU/L (ref 0–40)
Albumin/Globulin Ratio: 1.9 (ref 1.2–2.2)
Albumin: 4.3 g/dL (ref 3.8–4.9)
Alkaline Phosphatase: 142 IU/L — ABNORMAL HIGH (ref 44–121)
BUN/Creatinine Ratio: 15 (ref 9–23)
BUN: 17 mg/dL (ref 6–24)
Bilirubin Total: 0.3 mg/dL (ref 0.0–1.2)
CO2: 22 mmol/L (ref 20–29)
Calcium: 9.3 mg/dL (ref 8.7–10.2)
Chloride: 102 mmol/L (ref 96–106)
Creatinine, Ser: 1.15 mg/dL — ABNORMAL HIGH (ref 0.57–1.00)
Globulin, Total: 2.3 g/dL (ref 1.5–4.5)
Glucose: 221 mg/dL — ABNORMAL HIGH (ref 70–99)
Potassium: 3.9 mmol/L (ref 3.5–5.2)
Sodium: 139 mmol/L (ref 134–144)
Total Protein: 6.6 g/dL (ref 6.0–8.5)
eGFR: 55 mL/min/{1.73_m2} — ABNORMAL LOW (ref >59)

## 2022-07-15 LAB — CBC
Hematocrit: 49.9 % — ABNORMAL HIGH (ref 34.0–46.6)
Hemoglobin: 17 g/dL — ABNORMAL HIGH (ref 11.1–15.9)
MCH: 30.4 pg (ref 26.6–33.0)
MCHC: 34.1 g/dL (ref 31.5–35.7)
MCV: 89 fL (ref 79–97)
Platelets: 204 10*3/uL (ref 150–450)
RBC: 5.59 x10E6/uL — ABNORMAL HIGH (ref 3.77–5.28)
RDW: 12.4 % (ref 11.7–15.4)
WBC: 8.2 10*3/uL (ref 3.4–10.8)

## 2022-07-15 LAB — LIPID PANEL
Chol/HDL Ratio: 3.5 ratio (ref 0.0–4.4)
Cholesterol, Total: 142 mg/dL (ref 100–199)
HDL: 41 mg/dL (ref >39)
LDL Chol Calc (NIH): 84 mg/dL (ref 0–99)
Triglycerides: 87 mg/dL (ref 0–149)
VLDL Cholesterol Cal: 17 mg/dL (ref 5–40)

## 2022-07-15 LAB — HEMOGLOBIN A1C
Est. average glucose Bld gHb Est-mCnc: 303 mg/dL
Hgb A1c MFr Bld: 12.2 % — ABNORMAL HIGH (ref 4.8–5.6)

## 2022-07-15 LAB — TSH: TSH: 1.29 u[IU]/mL (ref 0.450–4.500)

## 2022-07-15 MED ORDER — INSULIN GLARGINE 100 UNITS/ML SOLOSTAR PEN: 10.0000 [IU] | PEN_INJECTOR | Freq: Every day | SUBCUTANEOUS | 0 refills | Status: DC

## 2022-07-15 MED ORDER — METFORMIN HCL ER 500 MG PO TB24: 1000.0000 mg | ORAL_TABLET | Freq: Two times a day (BID) | ORAL | 3 refills | Status: DC

## 2022-07-15 MED ORDER — EMPAGLIFLOZIN 25 MG PO TABS: 25.0000 mg | ORAL_TABLET | Freq: Every day | ORAL | 3 refills | Status: DC

## 2022-07-15 MED ORDER — "PEN NEEDLES 3/16"" 31G X 5 MM MISC": 1.0000 | Freq: Every evening | 3 refills | Status: DC

## 2022-07-15 NOTE — Progress Notes (Signed)
Extremely high A1c, 3 month average blood sugar. Ensure use of both jardiance and metformin to assist. Can start insulin if agreeable x1 per evening. Continue to recommend balanced, lower carb meals. Smaller meal size, adding snacks. Choosing water as drink of choice and increasing purposeful exercise.

## 2022-07-16 ENCOUNTER — Other Ambulatory Visit: Payer: Self-pay | Admitting: Family Medicine

## 2022-07-18 ENCOUNTER — Other Ambulatory Visit: Payer: Self-pay | Admitting: Family Medicine

## 2022-07-18 DIAGNOSIS — E039 Hypothyroidism, unspecified: Secondary | ICD-10-CM

## 2022-07-18 NOTE — Telephone Encounter (Signed)
Requested medications are due for refill today.  unsure  Requested medications are on the active medications list.  no  Last refill. 04/24/2022  Future visit scheduled.   no  Notes to clinic.  Please review for refill.    Requested Prescriptions  Pending Prescriptions Disp Refills   levothyroxine (SYNTHROID) 112 MCG tablet [Pharmacy Med Name: Levothyroxine Sodium 112 MCG Oral Tablet] 90 tablet 0    Sig: Take 1 tablet by mouth once daily     Endocrinology:  Hypothyroid Agents Passed - 07/18/2022 10:52 AM      Passed - TSH in normal range and within 360 days    TSH  Date Value Ref Range Status  07/11/2022 1.290 0.450 - 4.500 uIU/mL Final         Passed - Valid encounter within last 12 months    Recent Outpatient Visits           1 week ago Annual physical exam   [facility] Merita Norton T, FNP   5 months ago Type 2 diabetes mellitus with hyperglycemia, without long-term current use of insulin [facility])   [facility] Merita Norton T, FNP   1 year ago Type 2 diabetes mellitus with hyperglycemia, without long-term current use of insulin [facility])   [facility] Merita Norton T, FNP   1 year ago Type 2 diabetes mellitus with hyperglycemia, without long-term current use of insulin [facility])   [facility] Merita Norton T, FNP   1 year ago Encounter for annual physical exam   [facility], Marzella Schlein, MD

## 2022-08-01 ENCOUNTER — Other Ambulatory Visit: Payer: Self-pay | Admitting: Family Medicine

## 2022-08-01 DIAGNOSIS — J449 Chronic obstructive pulmonary disease, unspecified: Secondary | ICD-10-CM

## 2022-08-05 DIAGNOSIS — Z978 Presence of other specified devices: Secondary | ICD-10-CM | POA: Diagnosis not present

## 2022-08-05 DIAGNOSIS — F1190 Opioid use, unspecified, uncomplicated: Secondary | ICD-10-CM | POA: Diagnosis not present

## 2022-08-05 DIAGNOSIS — M1610 Unilateral primary osteoarthritis, unspecified hip: Secondary | ICD-10-CM | POA: Diagnosis not present

## 2022-08-05 DIAGNOSIS — Z79899 Other long term (current) drug therapy: Secondary | ICD-10-CM | POA: Diagnosis not present

## 2022-08-05 DIAGNOSIS — G894 Chronic pain syndrome: Secondary | ICD-10-CM | POA: Diagnosis not present

## 2022-09-24 DIAGNOSIS — M1610 Unilateral primary osteoarthritis, unspecified hip: Secondary | ICD-10-CM | POA: Diagnosis not present

## 2022-09-24 DIAGNOSIS — F1190 Opioid use, unspecified, uncomplicated: Secondary | ICD-10-CM | POA: Diagnosis not present

## 2022-09-24 DIAGNOSIS — G894 Chronic pain syndrome: Secondary | ICD-10-CM | POA: Diagnosis not present

## 2022-09-24 DIAGNOSIS — Z978 Presence of other specified devices: Secondary | ICD-10-CM | POA: Diagnosis not present

## 2022-09-24 DIAGNOSIS — Z79899 Other long term (current) drug therapy: Secondary | ICD-10-CM | POA: Diagnosis not present

## 2022-09-24 DIAGNOSIS — F419 Anxiety disorder, unspecified: Secondary | ICD-10-CM | POA: Diagnosis not present

## 2022-10-13 ENCOUNTER — Other Ambulatory Visit: Payer: Self-pay | Admitting: Family Medicine

## 2022-10-13 DIAGNOSIS — E039 Hypothyroidism, unspecified: Secondary | ICD-10-CM

## 2022-11-18 DIAGNOSIS — F1190 Opioid use, unspecified, uncomplicated: Secondary | ICD-10-CM | POA: Diagnosis not present

## 2022-11-18 DIAGNOSIS — F419 Anxiety disorder, unspecified: Secondary | ICD-10-CM | POA: Diagnosis not present

## 2022-11-18 DIAGNOSIS — G894 Chronic pain syndrome: Secondary | ICD-10-CM | POA: Diagnosis not present

## 2022-11-18 DIAGNOSIS — Z79899 Other long term (current) drug therapy: Secondary | ICD-10-CM | POA: Diagnosis not present

## 2022-11-18 DIAGNOSIS — Z978 Presence of other specified devices: Secondary | ICD-10-CM | POA: Diagnosis not present

## 2022-11-18 DIAGNOSIS — M1610 Unilateral primary osteoarthritis, unspecified hip: Secondary | ICD-10-CM | POA: Diagnosis not present

## 2022-11-18 DIAGNOSIS — Z5181 Encounter for therapeutic drug level monitoring: Secondary | ICD-10-CM | POA: Diagnosis not present

## 2022-12-13 ENCOUNTER — Other Ambulatory Visit: Payer: Self-pay | Admitting: Family Medicine

## 2022-12-13 DIAGNOSIS — J449 Chronic obstructive pulmonary disease, unspecified: Secondary | ICD-10-CM

## 2022-12-15 NOTE — Telephone Encounter (Signed)
Requested Prescriptions  Pending Prescriptions Disp Refills   albuterol (VENTOLIN HFA) 108 (90 Base) MCG/ACT inhaler [Pharmacy Med Name: Albuterol Sulfate HFA 108 (90 Base) MCG/ACT Inhalation Aerosol Solution] 18 g 2    Sig: INHALE 2 PUFFS BY MOUTH EVERY 6 HOURS AS NEEDED FOR WHEEZING FOR SHORTNESS OF BREATH     Pulmonology:  Beta Agonists 2 Passed - 12/13/2022  4:33 PM      Passed - Last BP in normal range    BP Readings from Last 1 Encounters:  07/11/22 113/80         Passed - Last Heart Rate in normal range    Pulse Readings from Last 1 Encounters:  07/11/22 82         Passed - Valid encounter within last 12 months    Recent Outpatient Visits           5 months ago Annual physical exam   [facility] Merita Norton T, FNP   10 months ago Type 2 diabetes mellitus with hyperglycemia, without long-term current use of insulin [facility])   [facility] Merita Norton T, FNP   1 year ago Type 2 diabetes mellitus with hyperglycemia, without long-term current use of insulin [facility])   [facility] Merita Norton T, FNP   1 year ago Type 2 diabetes mellitus with hyperglycemia, without long-term current use of insulin [facility])   [facility] Merita Norton T, FNP   1 year ago Encounter for annual physical exam   [facility], Marzella Schlein, MD

## 2022-12-31 DIAGNOSIS — F419 Anxiety disorder, unspecified: Secondary | ICD-10-CM | POA: Diagnosis not present

## 2022-12-31 DIAGNOSIS — M1610 Unilateral primary osteoarthritis, unspecified hip: Secondary | ICD-10-CM | POA: Diagnosis not present

## 2022-12-31 DIAGNOSIS — Z978 Presence of other specified devices: Secondary | ICD-10-CM | POA: Diagnosis not present

## 2022-12-31 DIAGNOSIS — G894 Chronic pain syndrome: Secondary | ICD-10-CM | POA: Diagnosis not present

## 2022-12-31 DIAGNOSIS — Z79899 Other long term (current) drug therapy: Secondary | ICD-10-CM | POA: Diagnosis not present

## 2022-12-31 DIAGNOSIS — Z791 Long term (current) use of non-steroidal anti-inflammatories (NSAID): Secondary | ICD-10-CM | POA: Diagnosis not present

## 2022-12-31 DIAGNOSIS — F1190 Opioid use, unspecified, uncomplicated: Secondary | ICD-10-CM | POA: Diagnosis not present

## 2023-01-14 ENCOUNTER — Other Ambulatory Visit: Payer: Self-pay | Admitting: Family Medicine

## 2023-01-14 DIAGNOSIS — E039 Hypothyroidism, unspecified: Secondary | ICD-10-CM

## 2023-01-27 DIAGNOSIS — G894 Chronic pain syndrome: Secondary | ICD-10-CM | POA: Diagnosis not present

## 2023-01-27 DIAGNOSIS — F1190 Opioid use, unspecified, uncomplicated: Secondary | ICD-10-CM | POA: Diagnosis not present

## 2023-01-27 DIAGNOSIS — F419 Anxiety disorder, unspecified: Secondary | ICD-10-CM | POA: Diagnosis not present

## 2023-01-27 DIAGNOSIS — M1610 Unilateral primary osteoarthritis, unspecified hip: Secondary | ICD-10-CM | POA: Diagnosis not present

## 2023-01-28 DIAGNOSIS — G894 Chronic pain syndrome: Secondary | ICD-10-CM | POA: Diagnosis not present

## 2023-01-28 DIAGNOSIS — F419 Anxiety disorder, unspecified: Secondary | ICD-10-CM | POA: Diagnosis not present

## 2023-01-28 DIAGNOSIS — M1610 Unilateral primary osteoarthritis, unspecified hip: Secondary | ICD-10-CM | POA: Diagnosis not present

## 2023-01-28 DIAGNOSIS — F1190 Opioid use, unspecified, uncomplicated: Secondary | ICD-10-CM | POA: Diagnosis not present

## 2023-01-29 DIAGNOSIS — M1610 Unilateral primary osteoarthritis, unspecified hip: Secondary | ICD-10-CM | POA: Diagnosis not present

## 2023-01-29 DIAGNOSIS — G894 Chronic pain syndrome: Secondary | ICD-10-CM | POA: Diagnosis not present

## 2023-01-29 DIAGNOSIS — F1190 Opioid use, unspecified, uncomplicated: Secondary | ICD-10-CM | POA: Diagnosis not present

## 2023-01-29 DIAGNOSIS — F419 Anxiety disorder, unspecified: Secondary | ICD-10-CM | POA: Diagnosis not present

## 2023-02-14 IMAGING — DX DG PORTABLE PELVIS
1 series · 1 of 1 positions shown · non-contrast
Comparison: Portable exam 8606 hours compared to 03/28/2008

CLINICAL DATA: Post RIGHT hip replacement

EXAM:
PORTABLE PELVIS 1-2 VIEWS

[pelvis ap]
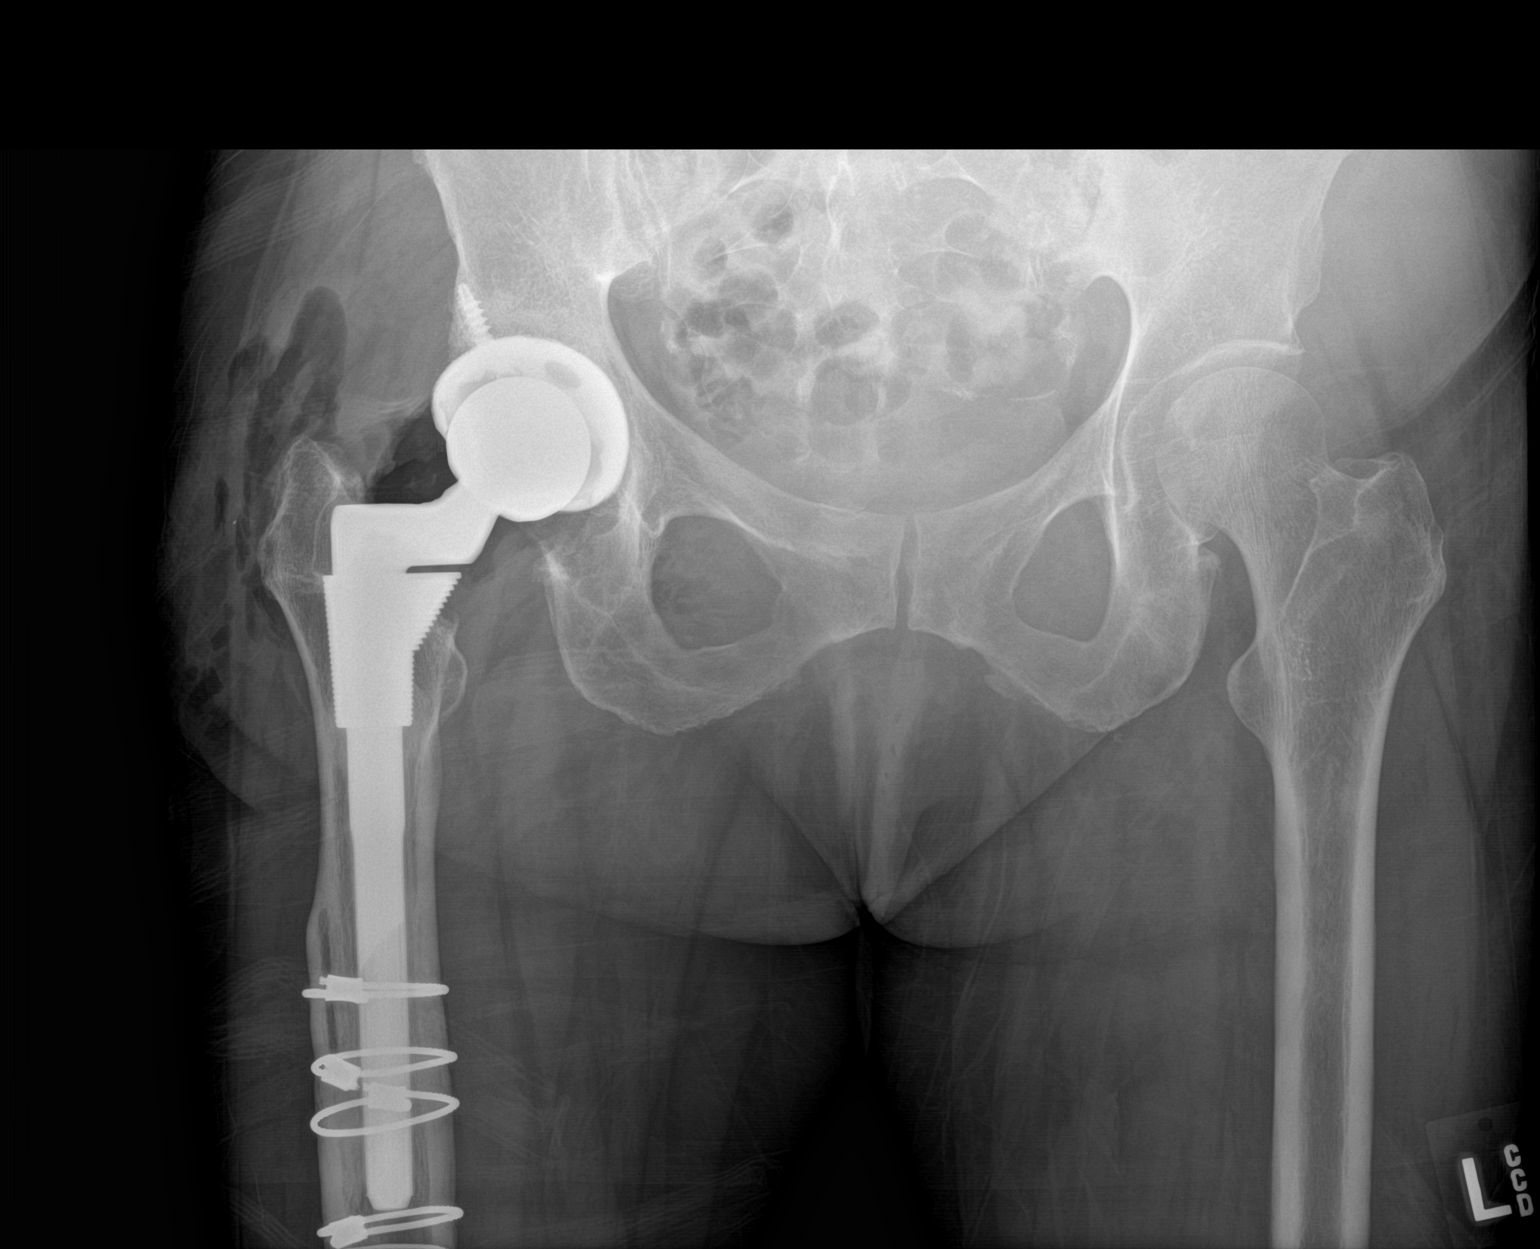

[1 of 1 positions shown; findings below may reference images not displayed]

FINDINGS: RIGHT hip prosthesis identified.

Old healed RIGHT femoral diaphyseal fracture seen with cerclage
wires.

No acute fracture, dislocation, or bone destruction.

Visualized pelvis intact with normal appearance of proximal LEFT
femur.
IMPRESSION: RIGHT hip prosthesis without acute complication.

Old healed proximal RIGHT femoral diaphyseal fracture.

## 2023-02-17 DIAGNOSIS — F1190 Opioid use, unspecified, uncomplicated: Secondary | ICD-10-CM | POA: Diagnosis not present

## 2023-02-17 DIAGNOSIS — Z79899 Other long term (current) drug therapy: Secondary | ICD-10-CM | POA: Diagnosis not present

## 2023-02-17 DIAGNOSIS — F419 Anxiety disorder, unspecified: Secondary | ICD-10-CM | POA: Diagnosis not present

## 2023-02-17 DIAGNOSIS — Z791 Long term (current) use of non-steroidal anti-inflammatories (NSAID): Secondary | ICD-10-CM | POA: Diagnosis not present

## 2023-02-17 DIAGNOSIS — G894 Chronic pain syndrome: Secondary | ICD-10-CM | POA: Diagnosis not present

## 2023-02-17 DIAGNOSIS — Z978 Presence of other specified devices: Secondary | ICD-10-CM | POA: Diagnosis not present

## 2023-02-17 DIAGNOSIS — M1610 Unilateral primary osteoarthritis, unspecified hip: Secondary | ICD-10-CM | POA: Diagnosis not present

## 2023-02-27 DIAGNOSIS — F1190 Opioid use, unspecified, uncomplicated: Secondary | ICD-10-CM | POA: Diagnosis not present

## 2023-02-27 DIAGNOSIS — F419 Anxiety disorder, unspecified: Secondary | ICD-10-CM | POA: Diagnosis not present

## 2023-02-27 DIAGNOSIS — G894 Chronic pain syndrome: Secondary | ICD-10-CM | POA: Diagnosis not present

## 2023-02-27 DIAGNOSIS — M1610 Unilateral primary osteoarthritis, unspecified hip: Secondary | ICD-10-CM | POA: Diagnosis not present

## 2023-03-26 ENCOUNTER — Telehealth: Payer: Self-pay

## 2023-03-26 NOTE — Telephone Encounter (Signed)
Left message for patient to cal and get appointment scheduled.  Patient needs to establish with new provider and have her DM checked   Patient was identified as falling into the [place] Measure - Diabetes.   Patient was: Left voicemail to schedule with primary care provider.

## 2023-03-30 DIAGNOSIS — F1190 Opioid use, unspecified, uncomplicated: Secondary | ICD-10-CM | POA: Diagnosis not present

## 2023-03-30 DIAGNOSIS — G894 Chronic pain syndrome: Secondary | ICD-10-CM | POA: Diagnosis not present

## 2023-03-30 DIAGNOSIS — F419 Anxiety disorder, unspecified: Secondary | ICD-10-CM | POA: Diagnosis not present

## 2023-03-30 DIAGNOSIS — M1610 Unilateral primary osteoarthritis, unspecified hip: Secondary | ICD-10-CM | POA: Diagnosis not present

## 2023-04-01 NOTE — Telephone Encounter (Signed)
MyChart letter also generated

## 2023-04-08 DIAGNOSIS — M1610 Unilateral primary osteoarthritis, unspecified hip: Secondary | ICD-10-CM | POA: Diagnosis not present

## 2023-04-08 DIAGNOSIS — Z978 Presence of other specified devices: Secondary | ICD-10-CM | POA: Diagnosis not present

## 2023-04-08 DIAGNOSIS — F1190 Opioid use, unspecified, uncomplicated: Secondary | ICD-10-CM | POA: Diagnosis not present

## 2023-04-08 DIAGNOSIS — G894 Chronic pain syndrome: Secondary | ICD-10-CM | POA: Diagnosis not present

## 2023-04-08 DIAGNOSIS — Z79899 Other long term (current) drug therapy: Secondary | ICD-10-CM | POA: Diagnosis not present

## 2023-04-08 DIAGNOSIS — F419 Anxiety disorder, unspecified: Secondary | ICD-10-CM | POA: Diagnosis not present

## 2023-04-13 ENCOUNTER — Telehealth: Payer: Self-pay | Admitting: Family Medicine

## 2023-04-13 DIAGNOSIS — E039 Hypothyroidism, unspecified: Secondary | ICD-10-CM

## 2023-04-13 MED ORDER — LEVOTHYROXINE SODIUM 112 MCG PO TABS
112.0000 ug | ORAL_TABLET | Freq: Every day | ORAL | 0 refills | Status: DC
Start: 2023-04-13 — End: 2023-07-21

## 2023-04-13 NOTE — Telephone Encounter (Signed)
 [facility] pharmacy is requesting refill levothyroxine (SYNTHROID) 112 MCG tablet  Please advise

## 2023-04-13 NOTE — Telephone Encounter (Signed)
 Rx sent. Patient needs labs updated for further refills

## 2023-04-27 DIAGNOSIS — F419 Anxiety disorder, unspecified: Secondary | ICD-10-CM | POA: Diagnosis not present

## 2023-04-27 DIAGNOSIS — F1190 Opioid use, unspecified, uncomplicated: Secondary | ICD-10-CM | POA: Diagnosis not present

## 2023-04-27 DIAGNOSIS — G894 Chronic pain syndrome: Secondary | ICD-10-CM | POA: Diagnosis not present

## 2023-04-27 DIAGNOSIS — M1610 Unilateral primary osteoarthritis, unspecified hip: Secondary | ICD-10-CM | POA: Diagnosis not present

## 2023-05-26 DIAGNOSIS — F419 Anxiety disorder, unspecified: Secondary | ICD-10-CM | POA: Diagnosis not present

## 2023-05-26 DIAGNOSIS — Z978 Presence of other specified devices: Secondary | ICD-10-CM | POA: Diagnosis not present

## 2023-05-26 DIAGNOSIS — Z5181 Encounter for therapeutic drug level monitoring: Secondary | ICD-10-CM | POA: Diagnosis not present

## 2023-05-26 DIAGNOSIS — Z79899 Other long term (current) drug therapy: Secondary | ICD-10-CM | POA: Diagnosis not present

## 2023-05-26 DIAGNOSIS — M1610 Unilateral primary osteoarthritis, unspecified hip: Secondary | ICD-10-CM | POA: Diagnosis not present

## 2023-05-26 DIAGNOSIS — G894 Chronic pain syndrome: Secondary | ICD-10-CM | POA: Diagnosis not present

## 2023-05-28 DIAGNOSIS — G894 Chronic pain syndrome: Secondary | ICD-10-CM | POA: Diagnosis not present

## 2023-05-28 DIAGNOSIS — F419 Anxiety disorder, unspecified: Secondary | ICD-10-CM | POA: Diagnosis not present

## 2023-05-28 DIAGNOSIS — M1610 Unilateral primary osteoarthritis, unspecified hip: Secondary | ICD-10-CM | POA: Diagnosis not present

## 2023-06-08 ENCOUNTER — Ambulatory Visit: Payer: BC Managed Care – PPO

## 2023-06-08 VITALS — Wt 117.0 lb

## 2023-06-08 DIAGNOSIS — Z0000 Encounter for general adult medical examination without abnormal findings: Secondary | ICD-10-CM

## 2023-06-08 NOTE — Progress Notes (Signed)
 Because this visit was a virtual/telehealth visit,  certain criteria was not obtained, such a blood pressure, CBG if applicable, and timed get up and go. Any medications not marked as "taking" were not mentioned during the medication reconciliation part of the visit. Any vitals not documented were not able to be obtained due to this being a telehealth visit or patient was unable to self-report a recent blood pressure reading due to a lack of equipment at home via telehealth. Vitals that have been documented are verbally provided by the patient.   Subjective:   Tasha Nichols is a 58 y.o. who presents for a Medicare Wellness preventive visit.  Visit Complete: Virtual I connected with  Tasha Nichols on 06/08/23 by a audio enabled telemedicine application and verified that I am speaking with the correct person using two identifiers.  Patient Location: Home  Provider Location: Office/Clinic  I discussed the limitations of evaluation and management by telemedicine. The patient expressed understanding and agreed to proceed.  Vital Signs: Because this visit was a virtual/telehealth visit, some criteria may be missing or patient reported. Any vitals not documented were not able to be obtained and vitals that have been documented are patient reported.  VideoDeclined- This patient declined Librarian, academic. Therefore the visit was completed with audio only.  Persons Participating in Visit: Patient.  AWV Questionnaire: No: Patient Medicare AWV questionnaire was not completed prior to this visit.  Cardiac Risk Factors include: advanced age (>26men, >38 women);hypertension;diabetes mellitus;smoking/ tobacco exposure     Objective:    Today's Vitals   06/08/23 1359 06/08/23 1400  Weight: 117 lb (53.1 kg)   PainSc: 8  8   PainLoc: Hip    Body mass index is 19.77 kg/m.     06/08/2023    2:10 PM 06/03/2022    9:34 AM 05/30/2021    9:52 AM 05/15/2020    3:02 PM  04/05/2020    3:00 PM 03/29/2020   10:20 AM 02/11/2018    6:59 PM  Advanced Directives  Does Patient Have a Medical Advance Directive? No No No No No No   Would patient like information on creating a medical advance directive? No - Patient declined  No - Patient declined  No - Patient declined  No - Patient declined    Current Medications (verified) Outpatient Encounter Medications as of 06/08/2023  Medication Sig   albuterol  (VENTOLIN  HFA) 108 (90 Base) MCG/ACT inhaler INHALE 2 PUFFS BY MOUTH EVERY 6 HOURS AS NEEDED FOR WHEEZING FOR SHORTNESS OF BREATH   baclofen (LIORESAL) 0.05 MG/ML injection 1 mL (50 mcg total) by Intrathecal route once for 1 dose. In pain pump   buPROPion  (WELLBUTRIN  XL) 150 MG 24 hr tablet Take 1 tablet (150 mg total) by mouth daily.   Cholecalciferol 125 MCG (5000 UT) TABS Take 5,000 Units by mouth daily.   cloNIDine (CATAPRES) 0.1 MG tablet Take 1 tablet (0.1 mg total) by mouth 3 (three) times daily. In pain pump   empagliflozin  (JARDIANCE ) 25 MG TABS tablet Take 1 tablet (25 mg total) by mouth daily.   HYDROcodone -acetaminophen  (NORCO) 5-325 MG tablet Take 1-2 tablets by mouth every 4 (four) hours as needed for moderate pain or severe pain.   HYDROmorphone  in sodium chloride  0.9 % 100 mL Inject into the vein continuous. In pain pump   insulin  glargine (LANTUS ) 100 unit/mL SOPN Inject 10 Units into the skin at bedtime. Check BG every morning; adjust insulin  dose by 2 units every  3 days until fasting blood sugar is consistently <110.   Insulin  Pen Needle (PEN NEEDLES 3/16") 31G X 5 MM MISC 1 each by Does not apply route at bedtime.   levothyroxine  (SYNTHROID ) 112 MCG tablet Take 1 tablet (112 mcg total) by mouth daily.   metFORMIN  (GLUCOPHAGE -XR) 500 MG 24 hr tablet Take 2 tablets (1,000 mg total) by mouth 2 (two) times daily with a meal.   naloxone (NARCAN) nasal spray 4 mg/0.1 mL Place into the nose.   No facility-administered encounter medications on file as of  06/08/2023.    Allergies (verified) Morphine and Penicillins   History: Past Medical History:  Diagnosis Date   Arthritis    Chronic pain    COPD (chronic obstructive pulmonary disease) (HCC)    Diabetes mellitus without complication (HCC)    Dyspnea    GERD (gastroesophageal reflux disease)    Hypertension    Hypothyroidism    Pneumonia    Thyroid  disease    Past Surgical History:  Procedure Laterality Date   ABDOMINAL HYSTERECTOMY  02/11/1999   CESAREAN SECTION  02/11/1992   JOINT REPLACEMENT Right 2005, 2010   hip   PAIN PUMP IMPLANTATION  2008, 2014   Dr Lonn Roads   ROOT CANAL Left    04/2021   TONSILLECTOMY  02/11/1968   TOTAL HIP REVISION Right 04/05/2020   Procedure: Revision Right total hip arthroplasty conversion of metal to ceramic with debridement;  Surgeon: Claiborne Crew, MD;  Location: [facility];  Service: Orthopedics;  Laterality: Right;  90 mins   Family History  Problem Relation Age of Onset   Diabetes Father    Breast cancer Neg Hx    Social History   Socioeconomic History   Marital status: Married    Spouse name: Not on file   Number of children: Not on file   Years of education: Not on file   Highest education level: Not on file  Occupational History   Not on file  Tobacco Use   Smoking status: Some Days    Current packs/day: 0.25    Types: Cigarettes   Smokeless tobacco: Never   Tobacco comments:    3-4 a week.  Vaping Use   Vaping status: Never Used  Substance and Sexual Activity   Alcohol use: No   Drug use: No   Sexual activity: Not Currently  Other Topics Concern   Not on file  Social History Narrative   Not on file   Social Drivers of Health   Financial Resource Strain: Low Risk  (06/08/2023)   Overall Financial Resource Strain (CARDIA)    Difficulty of Paying Living Expenses: Not very hard  Food Insecurity: No Food Insecurity (06/08/2023)   Hunger Vital Sign    Worried About Running Out of Food in the Last Year: Never true     Ran Out of Food in the Last Year: Never true  Transportation Needs: No Transportation Needs (06/08/2023)   PRAPARE - Administrator, Civil Service (Medical): No    Lack of Transportation (Non-Medical): No  Physical Activity: Sufficiently Active (06/08/2023)   Exercise Vital Sign    Days of Exercise per Week: 7 days    Minutes of Exercise per Session: 30 min  Stress: No Stress Concern Present (06/08/2023)   [employer] of Occupational Health - Occupational Stress Questionnaire    Feeling of Stress : Only a little  Social Connections: Moderately Isolated (06/08/2023)   Social Connection and Isolation Panel [NHANES]  Frequency of Communication with Friends and Family: More than three times a week    Frequency of Social Gatherings with Friends and Family: More than three times a week    Attends Religious Services: Never    Database administrator or Organizations: No    Attends Banker Meetings: Never    Marital Status: Married    Tobacco Counseling Ready to quit: Not Answered Counseling given: Not Answered Tobacco comments: 3-4 a week.    Clinical Intake:  Pre-visit preparation completed: Yes  Pain : 0-10 Pain Score: 8  Pain Type: Chronic pain Pain Location: Hip Pain Orientation: Right     BMI - recorded: 19.77 Nutritional Risks: None Diabetes: Yes CBG done?: No Did pt. bring in CBG monitor from home?: No  Lab Results  Component Value Date   HGBA1C 12.2 (H) 07/11/2022   HGBA1C 7.4 (H) 01/29/2022   HGBA1C 6.4 (A) 07/16/2021     How often do you need to have someone help you when you read instructions, pamphlets, or other written materials from your doctor or pharmacy?: 1 - Never What is the last grade level you completed in school?: HSG  Interpreter Needed?: No  Information entered by :: Druscilla Gerhard, LPN.   Activities of Daily Living     06/08/2023    2:11 PM 07/11/2022    8:47 AM  In your present state of health, do you  have any difficulty performing the following activities:  Hearing? 0 0  Vision? 0 1  Difficulty concentrating or making decisions? 0 1  Walking or climbing stairs? 0 1  Dressing or bathing? 0 0  Doing errands, shopping? 0 0  Preparing Food and eating ? N   Using the Toilet? N   In the past six months, have you accidently leaked urine? N   Do you have problems with loss of bowel control? N   Managing your Medications? N   Managing your Finances? N   Housekeeping or managing your Housekeeping? N     Patient Care Team: Mazie Speed, MD as PCP - General (Family Medicine) Bulah Caro as Consulting Physician (Optometry)  Indicate any recent Medical Services you may have received from other than [facility] providers in the past year (date may be approximate).     Assessment:   This is a routine wellness examination for Tasha Nichols.  Hearing/Vision screen Hearing Screening - Comments:: Denies hearing difficulties.  Vision Screening - Comments:: Wears rx glasses - not up to date with routine eye exams with [facility]    Goals Addressed             This Visit's Progress    Client understands the importance of follow-up with providers by attending scheduled visits       Quit Smoking         Depression Screen     06/08/2023    2:12 PM 07/11/2022    8:46 AM 06/03/2022    9:26 AM 01/29/2022    2:28 PM 07/16/2021    9:26 AM 05/30/2021    9:51 AM 04/12/2021    9:17 AM  PHQ 2/9 Scores  PHQ - 2 Score 0 0 0 0 1 0 0  PHQ- 9 Score 0 11  7 10 3 9     Fall Risk     06/08/2023    2:10 PM 07/11/2022    8:46 AM 06/03/2022    5:32 AM 01/29/2022    2:28 PM 07/16/2021  9:26 AM  Fall Risk   Falls in the past year? 0 0 0 0 0  Number falls in past yr: 0 0 0 0 0  Injury with Fall? 0 0 0 0 0  Risk for fall due to : No Fall Risks      Follow up Falls prevention discussed;Falls evaluation completed        MEDICARE RISK AT HOME:  Medicare Risk at Home Any stairs in or  around the home?: Yes (ATTIC) If so, are there any without handrails?: No Home free of loose throw rugs in walkways, pet beds, electrical cords, etc?: Yes Adequate lighting in your home to reduce risk of falls?: Yes Life alert?: No Use of a cane, walker or w/c?: No Grab bars in the bathroom?: No Shower chair or bench in shower?: Yes Elevated toilet seat or a handicapped toilet?: Yes  TIMED UP AND GO:  Was the test performed?  No  Cognitive Function: 6CIT completed    06/08/2023    2:13 PM  MMSE - Mini Mental State Exam  Not completed: Unable to complete        06/08/2023    2:01 PM 06/03/2022    9:37 AM  6CIT Screen  What Year? 0 points 0 points  What month? 0 points 0 points  What time? 0 points 0 points  Count back from 20 0 points 0 points  Months in reverse 0 points 0 points  Repeat phrase 0 points 0 points  Total Score 0 points 0 points    Immunizations Immunization History  Administered Date(s) Administered   PFIZER(Purple Top)SARS-COV-2 Vaccination 05/17/2019, 06/07/2019   Pfizer Covid-19 Vaccine Bivalent Booster 5y-11y 02/15/2020   Td 10/16/2010   Tdap 10/16/2010, 01/08/2021    Screening Tests Health Maintenance  Topic Date Due   Pneumococcal Vaccine 17-37 Years old (1 of 2 - PCV) Never done   Zoster Vaccines- Shingrix (1 of 2) Never done   Cervical Cancer Screening (HPV/Pap Cotest)  Never done   Lung Cancer Screening  Never done   OPHTHALMOLOGY EXAM  12/31/2021   FOOT EXAM  04/13/2022   COVID-19 Vaccine (4 - 2024-25 season) 10/12/2022   HEMOGLOBIN A1C  01/10/2023   Diabetic kidney evaluation - Urine ACR  01/30/2023   Colonoscopy  07/12/2023   Diabetic kidney evaluation - eGFR measurement  07/11/2023   INFLUENZA VACCINE  09/11/2023   Medicare Annual Wellness (AWV)  06/07/2024   MAMMOGRAM  06/24/2024   DTaP/Tdap/Td (4 - Td or Tdap) 01/09/2031   Hepatitis C Screening  Completed   HIV Screening  Completed   HPV VACCINES  Aged Out   Meningococcal B  Vaccine  Aged Out    Health Maintenance  Health Maintenance Due  Topic Date Due   Pneumococcal Vaccine 65-69 Years old (1 of 2 - PCV) Never done   Zoster Vaccines- Shingrix (1 of 2) Never done   Cervical Cancer Screening (HPV/Pap Cotest)  Never done   Lung Cancer Screening  Never done   OPHTHALMOLOGY EXAM  12/31/2021   FOOT EXAM  04/13/2022   COVID-19 Vaccine (4 - 2024-25 season) 10/12/2022   HEMOGLOBIN A1C  01/10/2023   Diabetic kidney evaluation - Urine ACR  01/30/2023   Colonoscopy  07/12/2023   Health Maintenance Items Addressed: Yes, Patient declined any referrals at this time.  Additional Screening:  Vision Screening: Recommended annual ophthalmology exams for early detection of glaucoma and other disorders of the eye.  Dental Screening: Recommended annual dental exams for  proper oral hygiene  Community Resource Referral / Chronic Care Management: CRR required this visit?  No   CCM required this visit?  No     Plan:     I have personally reviewed and noted the following in the patient's chart:   Medical and social history Use of alcohol, tobacco or illicit drugs  Current medications and supplements including opioid prescriptions. Patient is currently taking opioid prescriptions. Information provided to patient regarding non-opioid alternatives. Patient advised to discuss non-opioid treatment plan with their provider. Functional ability and status Nutritional status Physical activity Advanced directives List of other physicians Hospitalizations, surgeries, and ER visits in previous 12 months Vitals Screenings to include cognitive, depression, and falls Referrals and appointments  In addition, I have reviewed and discussed with patient certain preventive protocols, quality metrics, and best practice recommendations. A written personalized care plan for preventive services as well as general preventive health recommendations were provided to patient.      Margette Sheldon, LPN   [id number]/61/0960   After Visit Summary: (MyChart) Due to this being a telephonic visit, the after visit summary with patients personalized plan was offered to patient via MyChart   Notes: Please refer to Routing Comments.

## 2023-06-08 NOTE — Patient Instructions (Signed)
 Tasha Nichols , Thank you for taking time to come for your Medicare Wellness Visit. I appreciate your ongoing commitment to your health goals. Please review the following plan we discussed and let me know if I can assist you in the future.   Referrals/Orders/Follow-Ups/Clinician Recommendations: Yes, keep maintaining your health by keeping your appointments with Dr. Angela Bacigalupo and any specialists that you may see.  Call us  if you need anything.  Have a great year!!!!  This is a list of the screening recommended for you and due dates:  Health Maintenance  Topic Date Due   Pneumococcal Vaccination (1 of 2 - PCV) Never done   Zoster (Shingles) Vaccine (1 of 2) Never done   Pap with HPV screening  Never done   Screening for Lung Cancer  Never done   Eye exam for diabetics  12/31/2021   Complete foot exam   04/13/2022   COVID-19 Vaccine (4 - 2024-25 season) 10/12/2022   Hemoglobin A1C  01/10/2023   Yearly kidney health urinalysis for diabetes  01/30/2023   Colon Cancer Screening  07/12/2023   Yearly kidney function blood test for diabetes  07/11/2023   Flu Shot  09/11/2023   Medicare Annual Wellness Visit  06/07/2024   Mammogram  06/24/2024   DTaP/Tdap/Td vaccine (4 - Td or Tdap) 01/09/2031   Hepatitis C Screening  Completed   HIV Screening  Completed   HPV Vaccine  Aged Out   Meningitis B Vaccine  Aged Out    Advanced directives: (Declined) Advance directive discussed with you today. Even though you declined this today, please call our office should you change your mind, and we can give you the proper paperwork for you to fill out.  Next Medicare Annual Wellness Visit scheduled for next year: Yes

## 2023-06-27 DIAGNOSIS — M1610 Unilateral primary osteoarthritis, unspecified hip: Secondary | ICD-10-CM | POA: Diagnosis not present

## 2023-06-27 DIAGNOSIS — F1190 Opioid use, unspecified, uncomplicated: Secondary | ICD-10-CM | POA: Diagnosis not present

## 2023-06-27 DIAGNOSIS — F419 Anxiety disorder, unspecified: Secondary | ICD-10-CM | POA: Diagnosis not present

## 2023-06-27 DIAGNOSIS — G894 Chronic pain syndrome: Secondary | ICD-10-CM | POA: Diagnosis not present

## 2023-07-14 DIAGNOSIS — M1610 Unilateral primary osteoarthritis, unspecified hip: Secondary | ICD-10-CM | POA: Diagnosis not present

## 2023-07-14 DIAGNOSIS — F419 Anxiety disorder, unspecified: Secondary | ICD-10-CM | POA: Diagnosis not present

## 2023-07-14 DIAGNOSIS — G894 Chronic pain syndrome: Secondary | ICD-10-CM | POA: Diagnosis not present

## 2023-07-14 DIAGNOSIS — Z978 Presence of other specified devices: Secondary | ICD-10-CM | POA: Diagnosis not present

## 2023-07-21 ENCOUNTER — Ambulatory Visit (INDEPENDENT_AMBULATORY_CARE_PROVIDER_SITE_OTHER): Admitting: Family Medicine

## 2023-07-21 ENCOUNTER — Encounter: Payer: Self-pay | Admitting: Family Medicine

## 2023-07-21 VITALS — BP 128/71 | HR 94 | Resp 16 | Ht 64.0 in | Wt 115.8 lb

## 2023-07-21 DIAGNOSIS — D751 Secondary polycythemia: Secondary | ICD-10-CM

## 2023-07-21 DIAGNOSIS — F17209 Nicotine dependence, unspecified, with unspecified nicotine-induced disorders: Secondary | ICD-10-CM

## 2023-07-21 DIAGNOSIS — E1169 Type 2 diabetes mellitus with other specified complication: Secondary | ICD-10-CM

## 2023-07-21 DIAGNOSIS — Z5320 Procedure and treatment not carried out because of patient's decision for unspecified reasons: Secondary | ICD-10-CM | POA: Insufficient documentation

## 2023-07-21 DIAGNOSIS — J449 Chronic obstructive pulmonary disease, unspecified: Secondary | ICD-10-CM

## 2023-07-21 DIAGNOSIS — Z124 Encounter for screening for malignant neoplasm of cervix: Secondary | ICD-10-CM

## 2023-07-21 DIAGNOSIS — E039 Hypothyroidism, unspecified: Secondary | ICD-10-CM

## 2023-07-21 DIAGNOSIS — E559 Vitamin D deficiency, unspecified: Secondary | ICD-10-CM | POA: Diagnosis not present

## 2023-07-21 DIAGNOSIS — I152 Hypertension secondary to endocrine disorders: Secondary | ICD-10-CM

## 2023-07-21 DIAGNOSIS — Z79899 Other long term (current) drug therapy: Secondary | ICD-10-CM | POA: Diagnosis not present

## 2023-07-21 DIAGNOSIS — Z1211 Encounter for screening for malignant neoplasm of colon: Secondary | ICD-10-CM

## 2023-07-21 DIAGNOSIS — E1159 Type 2 diabetes mellitus with other circulatory complications: Secondary | ICD-10-CM | POA: Diagnosis not present

## 2023-07-21 DIAGNOSIS — Z0001 Encounter for general adult medical examination with abnormal findings: Secondary | ICD-10-CM

## 2023-07-21 DIAGNOSIS — E1165 Type 2 diabetes mellitus with hyperglycemia: Secondary | ICD-10-CM | POA: Diagnosis not present

## 2023-07-21 DIAGNOSIS — E785 Hyperlipidemia, unspecified: Secondary | ICD-10-CM

## 2023-07-21 DIAGNOSIS — Z0000 Encounter for general adult medical examination without abnormal findings: Secondary | ICD-10-CM

## 2023-07-21 DIAGNOSIS — I700 Atherosclerosis of aorta: Secondary | ICD-10-CM

## 2023-07-21 DIAGNOSIS — Z1231 Encounter for screening mammogram for malignant neoplasm of breast: Secondary | ICD-10-CM

## 2023-07-21 MED ORDER — LANCET DEVICE MISC: 1.0000 | Freq: Every day | 0 refills | Status: AC

## 2023-07-21 MED ORDER — LEVOTHYROXINE SODIUM 112 MCG PO TABS
112.0000 ug | ORAL_TABLET | Freq: Every day | ORAL | 3 refills | Status: AC
Start: 2023-07-21 — End: ?

## 2023-07-21 MED ORDER — ALBUTEROL SULFATE HFA 108 (90 BASE) MCG/ACT IN AERS: 1.0000 | INHALATION_SPRAY | Freq: Four times a day (QID) | RESPIRATORY_TRACT | 2 refills | Status: DC | PRN

## 2023-07-21 MED ORDER — LANCETS MISC. MISC: 1.0000 | Freq: Every day | 3 refills | Status: AC

## 2023-07-21 MED ORDER — BLOOD GLUCOSE TEST VI STRP: 1.0000 | ORAL_STRIP | Freq: Every day | 3 refills | Status: AC

## 2023-07-21 MED ORDER — BLOOD GLUCOSE MONITORING SUPPL DEVI: 1.0000 | Freq: Every day | 0 refills | Status: AC

## 2023-07-21 MED ORDER — METFORMIN HCL ER 500 MG PO TB24: 1000.0000 mg | ORAL_TABLET | Freq: Two times a day (BID) | ORAL | 3 refills | Status: AC

## 2023-07-21 MED ORDER — EMPAGLIFLOZIN 25 MG PO TABS: 25.0000 mg | ORAL_TABLET | Freq: Every day | ORAL | 3 refills | Status: AC

## 2023-07-21 NOTE — Patient Instructions (Signed)
 Please request they fax the record over from the eye doctor.

## 2023-07-21 NOTE — Progress Notes (Signed)
 Complete physical exam   [name]   DOB: Aug 25, 1965   58 y.o. Female  MRN: [id number] Visit Date: 07/21/2023  Today's healthcare provider: Carlean Charter, DO   Chief Complaint  Patient presents with   Annual Exam    Patient reports felling well. She is exercising some. She reports sleeping fairly well depending on the pain level.   Subjective    Tasha Nichols is a 58 y.o. female who presents today for a complete physical exam.  She reports consuming a general diet. She is exercising some through walking [place] and doing some lifting She generally feels well. She reports sleeping fairly well. She does not have additional problems to discuss today.   HPI HPI     Annual Exam    Additional comments: Patient reports felling well. She is exercising some. She reports sleeping fairly well depending on the pain level.      Last edited by Rosas, Joseline E, CMA on 07/21/2023  9:27 AM.      Tasha Nichols is a 58 year old female with type 2 diabetes who presents for an annual physical exam and medication refills.  She has type 2 diabetes and is currently on metformin , two tablets in the morning and two at supper, along with Jardiance . She has not been taught to test her blood sugars and does not have a meter. Her last A1c was noted to be high, but she is unsure of the exact value. She has not been on insulin  and did not fill the Lantus  prescribed by her prior provider.  She has a significant smoking history, having smoked a pack a day since she was 48 or 58 years old until about three years ago. She has now significantly reduced her smoking and only smokes occasionally. She has declined lung cancer screening. Her grandfather died of lung cancer.  She experiences headaches attributed to allergies and takes a [employer] brand sinus medication with headache relief and a [employer] allergy medication containing Benadryl . She takes the Benadryl  once a day and the sinus  medication as needed. She reports increased allergy symptoms with age, including clear nasal discharge and congestion.  She has a history of shingles and pneumonia. She has not received the flu shot due to personal experiences with her mother getting sick after receiving it.  She has a pain pump for chronic pain management, which includes baclofen, clonidine, and hydromorphone . She follows with a pain management specialist and has Narcan at home. She reports occasional numbness and tingling in her left hand and fingers.  She has a history of right hip replacement and experiences limitations in movement, particularly in her left hip, which has dislocated several times.  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 Her diet includes more chicken due to her husband's alpha-gal syndrome, and she has increased her intake of fruits and vegetables.    Past Medical History:  Diagnosis Date   Allergy    Anxiety    Arthritis    Chronic pain    COPD (chronic obstructive pulmonary disease) (HCC)    Diabetes mellitus without complication (HCC)    Dyspnea    GERD (gastroesophageal reflux disease)    Hypertension    Hypothyroidism    Neuromuscular disorder (HCC)    Pneumonia    Thyroid  disease    Past Surgical History:  Procedure  Laterality Date   ABDOMINAL HYSTERECTOMY  02/11/1999   CESAREAN SECTION  02/11/1992   JOINT REPLACEMENT Right 2005, 2010   hip   PAIN PUMP IMPLANTATION  2008, 2014   Dr Lonn Roads   ROOT CANAL Left    04/2021   TONSILLECTOMY  02/11/1968   TOTAL HIP REVISION Right 04/05/2020   Procedure: Revision Right total hip arthroplasty conversion of metal to ceramic with debridement;  Surgeon: Claiborne Crew, MD;  Location: [facility];  Service: Orthopedics;  Laterality: Right;  90 mins   Social History   Socioeconomic History   Marital status: Married    Spouse name: Not on file   Number of  children: Not on file   Years of education: Not on file   Highest education level: Some college, no degree  Occupational History   Not on file  Tobacco Use   Smoking status: Some Days    Current packs/day: 0.25    Average packs/day: 0.9 packs/day for 43.0 years (40.8 ttl pk-yrs)    Types: Cigarettes    Start date: 07/20/2020   Smokeless tobacco: Never   Tobacco comments:    3-4 a week.  Vaping Use   Vaping status: Never Used  Substance and Sexual Activity   Alcohol use: No   Drug use: No   Sexual activity: Yes    Birth control/protection: Post-menopausal  Other Topics Concern   Not on file  Social History Narrative   Not on file   Social Drivers of Health   Financial Resource Strain: Low Risk  (07/20/2023)   Overall Financial Resource Strain (CARDIA)    Difficulty of Paying Living Expenses: Not hard at all  Food Insecurity: No Food Insecurity (07/20/2023)   Hunger Vital Sign    Worried About Running Out of Food in the Last Year: Never true    Ran Out of Food in the Last Year: Never true  Transportation Needs: No Transportation Needs (07/20/2023)   PRAPARE - Administrator, Civil Service (Medical): No    Lack of Transportation (Non-Medical): No  Physical Activity: Sufficiently Active (07/20/2023)   Exercise Vital Sign    Days of Exercise per Week: 7 days    Minutes of Exercise per Session: 60 min  Stress: No Stress Concern Present (07/20/2023)   [employer] of Occupational Health - Occupational Stress Questionnaire    Feeling of Stress : Only a little  Social Connections: Moderately Integrated (07/20/2023)   Social Connection and Isolation Panel [NHANES]    Frequency of Communication with Friends and Family: More than three times a week    Frequency of Social Gatherings with Friends and Family: Once a week    Attends Religious Services: 1 to 4 times per year    Active Member of [employer] or Organizations: No    Attends Banker Meetings: Never     Marital Status: Married  Recent Concern: Social Connections - Moderately Isolated (06/08/2023)   Social Connection and Isolation Panel [NHANES]    Frequency of Communication with Friends and Family: More than three times a week    Frequency of Social Gatherings with Friends and Family: More than three times a week    Attends Religious Services: Never    Database administrator or Organizations: No    Attends Banker Meetings: Never    Marital Status: Married  Catering manager Violence: Not At Risk (06/08/2023)   Humiliation, Afraid, Rape, and Kick questionnaire    Fear of Current or  Ex-Partner: No    Emotionally Abused: No    Physically Abused: No    Sexually Abused: No   Family Status  Relation Name Status   Mother Rexene Catching Alive   Father Marylou Sobers Smith-Daddy Deceased   Daughter [place] Alive   Neg Hx  (Not Specified)  No partnership data on file   Family History  Problem Relation Age of Onset   ADD / ADHD Mother    Arthritis Mother    Diabetes Father    ADD / ADHD Daughter    Breast cancer Neg Hx    Allergies  Allergen Reactions   Morphine Itching and Nausea And Vomiting   Penicillins Rash    Tolerated Cephalosporin Date: 04/05/20.      Patient Care Team: Carlean Charter, DO as PCP - General (Family Medicine) Bulah Caro as Consulting Physician (Optometry)   Medications: Outpatient Medications Prior to Visit  Medication Sig   alprazolam (XANAX) 2 MG tablet Take 2 mg by mouth as needed for anxiety.   baclofen (LIORESAL) 0.05 MG/ML injection 1 mL (50 mcg total) by Intrathecal route once for 1 dose. In pain pump   Cholecalciferol 125 MCG (5000 UT) TABS Take 5,000 Units by mouth daily.   cloNIDine (CATAPRES) 0.1 MG tablet Take 1 tablet (0.1 mg total) by mouth 3 (three) times daily. In pain pump   diazepam (VALIUM) 5 MG tablet Take 5 mg by mouth.   HYDROcodone -acetaminophen  (NORCO) 7.5-325 MG tablet Take 1 tablet by mouth.   HYDROmorphone  in sodium  chloride 0.9 % 100 mL Inject into the vein continuous. In pain pump   ibuprofen  (ADVIL ) 100 MG tablet 3 tablets 4 times a day by oral route.   naloxone (NARCAN) nasal spray 4 mg/0.1 mL Place into the nose.   [DISCONTINUED] albuterol  (VENTOLIN  HFA) 108 (90 Base) MCG/ACT inhaler INHALE 2 PUFFS BY MOUTH EVERY 6 HOURS AS NEEDED FOR WHEEZING FOR SHORTNESS OF BREATH   [DISCONTINUED] empagliflozin  (JARDIANCE ) 25 MG TABS tablet Take 1 tablet (25 mg total) by mouth daily.   [DISCONTINUED] levothyroxine  (SYNTHROID ) 112 MCG tablet Take 1 tablet (112 mcg total) by mouth daily.   [DISCONTINUED] metFORMIN  (GLUCOPHAGE -XR) 500 MG 24 hr tablet Take 2 tablets (1,000 mg total) by mouth 2 (two) times daily with a meal.   [DISCONTINUED] buPROPion  (WELLBUTRIN  XL) 150 MG 24 hr tablet Take 1 tablet (150 mg total) by mouth daily.   [DISCONTINUED] HYDROcodone -acetaminophen  (NORCO) 5-325 MG tablet Take 1-2 tablets by mouth every 4 (four) hours as needed for moderate pain or severe pain.   [DISCONTINUED] insulin  glargine (LANTUS ) 100 unit/mL SOPN Inject 10 Units into the skin at bedtime. Check BG every morning; adjust insulin  dose by 2 units every 3 days until fasting blood sugar is consistently <110.   [DISCONTINUED] Insulin  Pen Needle (PEN NEEDLES 3/16") 31G X 5 MM MISC 1 each by Does not apply route at bedtime.   No facility-administered medications prior to visit.    Review of Systems    Objective    BP 128/71 (BP Location: Left Arm, Patient Position: Sitting, Cuff Size: Normal)   Pulse 94   Resp 16   Ht 5\' 4"  (1.626 m)   Wt 115 lb 12.8 oz (52.5 kg)   SpO2 97%   BMI 19.88 kg/m    Physical Exam Vitals and nursing note reviewed.  Constitutional:      General: She is awake.     Appearance: Normal appearance.  HENT:     Head: Normocephalic and atraumatic.  Right Ear: Tympanic membrane, ear canal and external ear normal.     Left Ear: Tympanic membrane, ear canal and external ear normal.     Nose: Nose  normal.     Right Turbinates: Pale.     Left Turbinates: Pale.     Mouth/Throat:     Mouth: Mucous membranes are moist.     Pharynx: Oropharynx is clear. No oropharyngeal exudate or posterior oropharyngeal erythema.  Eyes:     General: No scleral icterus.    Extraocular Movements: Extraocular movements intact.     Conjunctiva/sclera: Conjunctivae normal.     Pupils: Pupils are equal, round, and reactive to light.  Neck:     Thyroid : No thyromegaly or thyroid  tenderness.  Cardiovascular:     Rate and Rhythm: Normal rate and regular rhythm.     Pulses:          Dorsalis pedis pulses are 1+ on the right side and 1+ on the left side.       Posterior tibial pulses are 1+ on the right side and 1+ on the left side.     Heart sounds: Normal heart sounds.  Pulmonary:     Effort: Pulmonary effort is normal. No tachypnea, bradypnea or respiratory distress.     Breath sounds: Normal breath sounds. No stridor. No wheezing, rhonchi or rales.  Abdominal:     General: Bowel sounds are normal. There is no distension.     Palpations: Abdomen is soft. There is no mass.     Tenderness: There is no abdominal tenderness. There is no guarding.     Hernia: No hernia is present.  Musculoskeletal:     Cervical back: Normal range of motion and neck supple.     Right lower leg: No edema.     Left lower leg: No edema.     Right foot: Normal range of motion. No deformity, bunion, Charcot foot, foot drop or prominent metatarsal heads.     Left foot: Normal range of motion. No deformity, bunion, Charcot foot, foot drop or prominent metatarsal heads.       Feet:  Feet:     Right foot:     Protective Sensation: 10 sites tested.  10 sites sensed.     Skin integrity: Callus present. No ulcer, blister, skin breakdown, erythema, warmth, dry skin or fissure.     Toenail Condition: Right toenails are normal.     Left foot:     Protective Sensation: 10 sites tested.  10 sites sensed.     Skin integrity: Callus  present. No ulcer, blister, skin breakdown, erythema, warmth, dry skin or fissure.     Toenail Condition: Left toenails are normal.     Comments: Headling wounds as noted, reported d/t fire ants and dog Lymphadenopathy:     Cervical: No cervical adenopathy.  Skin:    General: Skin is warm and dry.  Neurological:     Mental Status: She is alert and oriented to person, place, and time. Mental status is at baseline.  Psychiatric:        Mood and Affect: Mood normal.        Behavior: Behavior normal.      Last depression screening scores    07/21/2023    9:34 AM 06/08/2023    2:12 PM 07/11/2022    8:46 AM  PHQ 2/9 Scores  PHQ - 2 Score 0 0 0  PHQ- 9 Score 4 0 11   Last fall risk screening  07/21/2023    9:34 AM  Fall Risk   Falls in the past year? 0  Number falls in past yr: 0  Injury with Fall? 0  Risk for fall due to : No Fall Risks   Last Audit-C alcohol use screening    07/20/2023    6:21 PM  Alcohol Use Disorder Test (AUDIT)  1. How often do you have a drink containing alcohol? 0  2. How many drinks containing alcohol do you have on a typical day when you are drinking? 0  3. How often do you have six or more drinks on one occasion? 0  AUDIT-C Score 0      Patient-reported   A score of 3 or more in women, and 4 or more in men indicates increased risk for alcohol abuse, EXCEPT if all of the points are from question 1   No results found for any visits on 07/21/23.  Assessment & Plan    Routine Health Maintenance and Physical Exam  Exercise Activities and Dietary recommendations  Goals      Client understands the importance of follow-up with providers by attending scheduled visits     DIET - EAT MORE FRUITS AND VEGETABLES     Quit Smoking        Immunization History  Administered Date(s) Administered   PFIZER(Purple Top)SARS-COV-2 Vaccination 05/17/2019, 06/07/2019   Pfizer Covid-19 Vaccine Bivalent Booster 5y-11y 02/15/2020   Td 10/16/2010   Tdap  10/16/2010, 01/08/2021    Health Maintenance  Topic Date Due   Cervical Cancer Screening (HPV/Pap Cotest)  Never done   OPHTHALMOLOGY EXAM  12/31/2021   HEMOGLOBIN A1C  01/10/2023   Diabetic kidney evaluation - Urine ACR  01/30/2023   Diabetic kidney evaluation - eGFR measurement  07/11/2023   Colonoscopy  07/12/2023   Zoster Vaccines- Shingrix (1 of 2) 10/21/2023 (Originally [birth year]/28/1982)   COVID-19 Vaccine (4 - 2024-25 season) 11/11/2023 (Originally 10/12/2022)   Lung Cancer Screening  07/20/2024 (Originally 05/27/2013)   Pneumococcal Vaccine 23-10 Years old (1 of 2 - PCV) 07/20/2024 (Originally 05/28/1982)   INFLUENZA VACCINE  09/11/2023   Medicare Annual Wellness (AWV)  06/07/2024   MAMMOGRAM  06/24/2024   FOOT EXAM  07/20/2024   DTaP/Tdap/Td (4 - Td or Tdap) 01/09/2031   Hepatitis C Screening  Completed   HIV Screening  Completed   HPV VACCINES  Aged Out   Meningococcal B Vaccine  Aged Out    Discussed health benefits of physical activity, and encouraged her to engage in regular exercise appropriate for her age and condition.   Annual physical exam  Acquired hypothyroidism -     TSH Rfx on Abnormal to Free T4 -     Levothyroxine  Sodium; Take 1 tablet (112 mcg total) by mouth daily.  Dispense: 90 tablet; Refill: 3  Hypertension associated with diabetes (HCC) -     Comprehensive metabolic panel with GFR  Hyperlipidemia associated with type 2 diabetes mellitus (HCC) -     Lipid panel  Chronic obstructive pulmonary disease, unspecified COPD type (HCC) -     Albuterol  Sulfate HFA; Inhale 1-2 puffs into the lungs every 6 (six) hours as needed for wheezing or shortness of breath.  Dispense: 18 g; Refill: 2  Type 2 diabetes mellitus with hyperglycemia, without long-term current use of insulin  (HCC) -     Hemoglobin A1c -     Microalbumin / creatinine urine ratio -     Vitamin B12 -     Blood Glucose  Monitoring Suppl; 1 each by Does not apply route daily before breakfast. May  substitute to any manufacturer covered by patient's insurance.  Dispense: 1 each; Refill: 0 -     Blood Glucose Test; 1 each by In Vitro route daily before breakfast. May substitute to any manufacturer covered by patient's insurance.  Dispense: 100 strip; Refill: 3 -     Lancet Device; 1 each by Does not apply route daily before breakfast. May substitute to any manufacturer covered by patient's insurance.  Dispense: 1 each; Refill: 0 -     Lancets Misc.; 1 each by Does not apply route daily before breakfast. May substitute to any manufacturer covered by patient's insurance.  Dispense: 100 each; Refill: 3 -     Empagliflozin ; Take 1 tablet (25 mg total) by mouth daily.  Dispense: 90 tablet; Refill: 3 -     metFORMIN  HCl ER; Take 2 tablets (1,000 mg total) by mouth 2 (two) times daily with a meal.  Dispense: 360 tablet; Refill: 3  Vitamin D  deficiency -     VITAMIN D  25 Hydroxy (Vit-D Deficiency, Fractures)  Aortic atherosclerosis (HCC)  Depression, recurrent (HCC)  Tobacco use disorder, continuous  Encounter for screening mammogram for breast cancer -     3D Screening Mammogram, Left and Right; Future  High risk medication use -     Vitamin B12  Acquired polycythemia -     CBC with Differential/Platelet  Encounter for colorectal cancer screening -     Ambulatory referral to Gastroenterology  Lung cancer screening declined by patient  Pap smear for cervical cancer screening -     Ambulatory referral to Gynecology     Annual physical exam; transfer of care Physical exam overall unremarkable except as noted above. Routine lab work ordered as noted.  Hesitant about flu vaccine but considering shingles vaccine. Strongly recommended shingles vaccine due to history. Pneumonia vaccine recommended. Declines lung cancer screening. Due for Pap smear. - Recommend shingles vaccine. - Recommend pneumonia vaccine (Prevnar 20 or 21). - Refer to gynecology for Pap smear. - Encourage COVID  booster vaccination. - Discuss lung cancer screening further if open.  For today, patient declined referral for lung cancer screening.  Type 2 Diabetes Mellitus Poor glycemic control with high A1c. Concerns about metformin  despite its efficacy due to comments of family/friends. Emphasized importance of glycemic control to prevent complications. - Foot exam performed today - Order blood work for A1c and kidney function. - Continue metformin  and Jardiance . - Follow up in 3 months to reassess management and review labs.  Aortic atherosclerosis Continue to optimize risk factors.  Allergic Rhinitis Headaches linked to allergies and sinus issues. Current regimen includes daily antihistamine and Benadryl  as needed. Advised consistent antihistamine use. - Advise consistent use of daily antihistamine. - Consider cetirizine or fexofenadine if current regimen is ineffective.  Tobacco Use Disorder Significant reduction in smoking; encouraged further reduction and ultimate cessation. Declines lung cancer screening. Discussed importance of early detection for treatability.    Return in about 3 months (around 10/21/2023) for DM.     I discussed the assessment and treatment plan with the patient  The patient was provided an opportunity to ask questions and all were answered. The patient agreed with the plan and demonstrated an understanding of the instructions.   The patient was advised to call back or seek an in-person evaluation if the symptoms worsen or if the condition fails to improve as anticipated.    Iridian Reader N Enslee Bibbins, DO  [facility] [phone] (phone) [phone] (fax)  [facility]

## 2023-07-22 LAB — COMPREHENSIVE METABOLIC PANEL WITH GFR
ALT: 12 IU/L (ref 0–32)
AST: 15 IU/L (ref 0–40)
Albumin: 4.6 g/dL (ref 3.8–4.9)
Alkaline Phosphatase: 92 IU/L (ref 44–121)
BUN/Creatinine Ratio: 16 (ref 12–28)
BUN: 16 mg/dL (ref 8–27)
Bilirubin Total: 0.5 mg/dL (ref 0.0–1.2)
CO2: 20 mmol/L (ref 20–29)
Calcium: 9.9 mg/dL (ref 8.7–10.3)
Chloride: 99 mmol/L (ref 96–106)
Creatinine, Ser: 1.02 mg/dL — ABNORMAL HIGH (ref 0.57–1.00)
Globulin, Total: 2.1 g/dL (ref 1.5–4.5)
Glucose: 113 mg/dL — ABNORMAL HIGH (ref 70–99)
Potassium: 4.6 mmol/L (ref 3.5–5.2)
Sodium: 139 mmol/L (ref 134–144)
Total Protein: 6.7 g/dL (ref 6.0–8.5)
eGFR: 63 mL/min/{1.73_m2} (ref >59)

## 2023-07-22 LAB — LIPID PANEL
Chol/HDL Ratio: 3 ratio (ref 0.0–4.4)
Cholesterol, Total: 121 mg/dL (ref 100–199)
HDL: 41 mg/dL (ref >39)
LDL Chol Calc (NIH): 66 mg/dL (ref 0–99)
Triglycerides: 64 mg/dL (ref 0–149)
VLDL Cholesterol Cal: 14 mg/dL (ref 5–40)

## 2023-07-22 LAB — MICROALBUMIN / CREATININE URINE RATIO
Creatinine, Urine: 29.5 mg/dL
Microalb/Creat Ratio: <10 mg/g{creat} (ref 0–29)
Microalbumin, Urine: <3 ug/mL

## 2023-07-22 LAB — CBC WITH DIFFERENTIAL/PLATELET
Basophils Absolute: 0.1 10*3/uL (ref 0.0–0.2)
Basos: 1 %
EOS (ABSOLUTE): 0.2 10*3/uL (ref 0.0–0.4)
Eos: 3 %
Hematocrit: 48.4 % — ABNORMAL HIGH (ref 34.0–46.6)
Hemoglobin: 15.9 g/dL (ref 11.1–15.9)
Immature Grans (Abs): 0 10*3/uL (ref 0.0–0.1)
Immature Granulocytes: 0 %
Lymphocytes Absolute: 1.4 10*3/uL (ref 0.7–3.1)
Lymphs: 22 %
MCH: 30.2 pg (ref 26.6–33.0)
MCHC: 32.9 g/dL (ref 31.5–35.7)
MCV: 92 fL (ref 79–97)
Monocytes Absolute: 0.4 10*3/uL (ref 0.1–0.9)
Monocytes: 7 %
Neutrophils Absolute: 4.4 10*3/uL (ref 1.4–7.0)
Neutrophils: 66 %
Platelets: 188 10*3/uL (ref 150–450)
RBC: 5.27 x10E6/uL (ref 3.77–5.28)
RDW: 13.1 % (ref 11.7–15.4)
WBC: 6.5 10*3/uL (ref 3.4–10.8)

## 2023-07-22 LAB — HEMOGLOBIN A1C
Est. average glucose Bld gHb Est-mCnc: 169 mg/dL
Hgb A1c MFr Bld: 7.5 % — ABNORMAL HIGH (ref 4.8–5.6)

## 2023-07-22 LAB — VITAMIN D 25 HYDROXY (VIT D DEFICIENCY, FRACTURES): Vit D, 25-Hydroxy: 49.7 ng/mL (ref 30.0–100.0)

## 2023-07-22 LAB — VITAMIN B12: Vitamin B-12: 367 pg/mL (ref 232–1245)

## 2023-07-22 LAB — TSH RFX ON ABNORMAL TO FREE T4: TSH: 0.452 u[IU]/mL (ref 0.450–4.500)

## 2023-07-28 ENCOUNTER — Ambulatory Visit: Payer: Self-pay | Admitting: Family Medicine

## 2023-07-28 DIAGNOSIS — F419 Anxiety disorder, unspecified: Secondary | ICD-10-CM | POA: Diagnosis not present

## 2023-07-28 DIAGNOSIS — G894 Chronic pain syndrome: Secondary | ICD-10-CM | POA: Diagnosis not present

## 2023-07-28 DIAGNOSIS — M1610 Unilateral primary osteoarthritis, unspecified hip: Secondary | ICD-10-CM | POA: Diagnosis not present

## 2023-07-28 DIAGNOSIS — F1190 Opioid use, unspecified, uncomplicated: Secondary | ICD-10-CM | POA: Diagnosis not present

## 2023-08-03 ENCOUNTER — Other Ambulatory Visit: Payer: Self-pay

## 2023-08-03 ENCOUNTER — Telehealth: Payer: Self-pay

## 2023-08-03 DIAGNOSIS — Z860100 Personal history of colon polyps, unspecified: Secondary | ICD-10-CM

## 2023-08-03 NOTE — Telephone Encounter (Signed)
 Gastroenterology Pre-Procedure Review  Request Date: 10/01/23 Requesting Physician: Dr. Jinny  PATIENT REVIEW QUESTIONS: The patient responded to the following health history questions as indicated:    1. Are you having any GI issues? no 2. Do you have a personal history of Polyps? Yes 2015 Dr.Wohl 3. Do you have a family history of Colon Cancer or Polyps? no 4. Diabetes Mellitus? no 5. Joint replacements in the past 12 months?no 6. Major health problems in the past 3 months?no 7. Any artificial heart valves, MVP, or defibrillator?no    MEDICATIONS & ALLERGIES:    Patient reports the following regarding taking any anticoagulation/antiplatelet therapy:   Plavix, Coumadin, Eliquis, Xarelto, Lovenox , Pradaxa, Brilinta, or Effient? no Aspirin ? no  Patient confirms/reports the following medications:  Current Outpatient Medications  Medication Sig Dispense Refill   albuterol  (VENTOLIN  HFA) 108 (90 Base) MCG/ACT inhaler Inhale 1-2 puffs into the lungs every 6 (six) hours as needed for wheezing or shortness of breath. 18 g 2   alprazolam (XANAX) 2 MG tablet Take 2 mg by mouth as needed for anxiety.     baclofen (LIORESAL) 0.05 MG/ML injection 1 mL (50 mcg total) by Intrathecal route once for 1 dose. In pain pump 1 mL 0   Blood Glucose Monitoring Suppl DEVI 1 each by Does not apply route daily before breakfast. May substitute to any manufacturer covered by patient's insurance. 1 each 0   Cholecalciferol 125 MCG (5000 UT) TABS Take 5,000 Units by mouth daily.     cloNIDine (CATAPRES) 0.1 MG tablet Take 1 tablet (0.1 mg total) by mouth 3 (three) times daily. In pain pump 90 tablet 3   diazepam (VALIUM) 5 MG tablet Take 5 mg by mouth.     empagliflozin  (JARDIANCE ) 25 MG TABS tablet Take 1 tablet (25 mg total) by mouth daily. 90 tablet 3   Glucose Blood (BLOOD GLUCOSE TEST STRIPS) STRP 1 each by In Vitro route daily before breakfast. May substitute to any manufacturer covered by patient's insurance.  100 strip 3   HYDROmorphone  in sodium chloride  0.9 % 100 mL Inject into the vein continuous. In pain pump  0   ibuprofen  (ADVIL ) 100 MG tablet 3 tablets 4 times a day by oral route.     Lancet Device MISC 1 each by Does not apply route daily before breakfast. May substitute to any manufacturer covered by patient's insurance. 1 each 0   Lancets Misc. MISC 1 each by Does not apply route daily before breakfast. May substitute to any manufacturer covered by patient's insurance. 100 each 3   levothyroxine  (SYNTHROID ) 112 MCG tablet Take 1 tablet (112 mcg total) by mouth daily. 90 tablet 3   metFORMIN  (GLUCOPHAGE -XR) 500 MG 24 hr tablet Take 2 tablets (1,000 mg total) by mouth 2 (two) times daily with a meal. 360 tablet 3   naloxone (NARCAN) nasal spray 4 mg/0.1 mL Place into the nose.     No current facility-administered medications for this visit.    Patient confirms/reports the following allergies:  Allergies  Allergen Reactions   Morphine Itching and Nausea And Vomiting   Penicillins Rash    Tolerated Cephalosporin Date: 04/05/20.      No orders of the defined types were placed in this encounter.   AUTHORIZATION INFORMATION Primary Insurance: 1D#: Group #:  Secondary Insurance: 1D#: Group #:  SCHEDULE INFORMATION: Date:  Time: Location:

## 2023-08-07 ENCOUNTER — Other Ambulatory Visit: Payer: Self-pay | Admitting: Family Medicine

## 2023-08-07 DIAGNOSIS — Z124 Encounter for screening for malignant neoplasm of cervix: Secondary | ICD-10-CM

## 2023-08-27 DIAGNOSIS — F419 Anxiety disorder, unspecified: Secondary | ICD-10-CM | POA: Diagnosis not present

## 2023-08-27 DIAGNOSIS — M1610 Unilateral primary osteoarthritis, unspecified hip: Secondary | ICD-10-CM | POA: Diagnosis not present

## 2023-08-27 DIAGNOSIS — G894 Chronic pain syndrome: Secondary | ICD-10-CM | POA: Diagnosis not present

## 2023-08-27 DIAGNOSIS — F1190 Opioid use, unspecified, uncomplicated: Secondary | ICD-10-CM | POA: Diagnosis not present

## 2023-09-27 DIAGNOSIS — F419 Anxiety disorder, unspecified: Secondary | ICD-10-CM | POA: Diagnosis not present

## 2023-09-27 DIAGNOSIS — G894 Chronic pain syndrome: Secondary | ICD-10-CM | POA: Diagnosis not present

## 2023-09-27 DIAGNOSIS — F1190 Opioid use, unspecified, uncomplicated: Secondary | ICD-10-CM | POA: Diagnosis not present

## 2023-09-27 DIAGNOSIS — M1610 Unilateral primary osteoarthritis, unspecified hip: Secondary | ICD-10-CM | POA: Diagnosis not present

## 2023-09-28 ENCOUNTER — Other Ambulatory Visit: Payer: Self-pay

## 2023-09-28 MED ORDER — NA SULFATE-K SULFATE-MG SULF 17.5-3.13-1.6 GM/177ML PO SOLN: 354.0000 mL | Freq: Once | ORAL | 0 refills | Status: AC

## 2023-09-30 NOTE — Anesthesia Preprocedure Evaluation (Signed)
 Anesthesia Evaluation  Patient identified by MRN, date of birth, ID band Patient awake    Reviewed: Allergy & Precautions, H&P , NPO status , Patient's Chart, lab work & pertinent test results  Airway Mallampati: II  TM Distance: >3 FB Neck ROM: full    Dental no notable dental hx.    Pulmonary shortness of breath, COPD, Current Smoker   Pulmonary exam normal        Cardiovascular hypertension, Normal cardiovascular exam     Neuro/Psych  PSYCHIATRIC DISORDERS Anxiety Depression     Neuromuscular disease    GI/Hepatic negative GI ROS, Neg liver ROS,,,  Endo/Other  diabetesHypothyroidism    Renal/GU negative Renal ROS  negative genitourinary   Musculoskeletal   Abdominal Normal abdominal exam  (+)   Peds  Hematology negative hematology ROS (+)   Anesthesia Other Findings Past Medical History: No date: Allergy No date: Anxiety No date: Arthritis No date: Chronic pain No date: COPD (chronic obstructive pulmonary disease) (HCC) No date: Diabetes mellitus without complication (HCC) No date: Dyspnea No date: GERD (gastroesophageal reflux disease) No date: Hypertension No date: Hypothyroidism No date: Neuromuscular disorder (HCC) No date: Pneumonia No date: Thyroid  disease  Past Surgical History: 02/11/1999: ABDOMINAL HYSTERECTOMY 02/11/1992: CESAREAN SECTION 2005, 2010: JOINT REPLACEMENT; Right     Comment:  hip 2008, 2014: PAIN PUMP IMPLANTATION     Comment:  Dr Nick No date: ROOT CANAL; Left     Comment:  04/2021 02/11/1968: TONSILLECTOMY 04/05/2020: TOTAL HIP REVISION; Right     Comment:  Procedure: Revision Right total hip arthroplasty               conversion of metal to ceramic with debridement;                Surgeon: Ernie Cough, MD;  Location: [facility];  Service:               Orthopedics;  Laterality: Right;  90 mins     Reproductive/Obstetrics negative OB ROS                               Anesthesia Physical Anesthesia Plan  ASA: 3  Anesthesia Plan: General   Post-op Pain Management:    Induction: Intravenous  PONV Risk Score and Plan: Propofol  infusion and TIVA  Airway Management Planned: Natural Airway  Additional Equipment:   Intra-op Plan:   Post-operative Plan:   Informed Consent: I have reviewed the patients History and Physical, chart, labs and discussed the procedure including the risks, benefits and alternatives for the proposed anesthesia with the patient or authorized representative who has indicated his/her understanding and acceptance.     Dental Advisory Given  Plan Discussed with: CRNA and Surgeon  Anesthesia Plan Comments:          Anesthesia Quick Evaluation

## 2023-10-01 ENCOUNTER — Ambulatory Visit: Payer: Self-pay | Admitting: Anesthesiology

## 2023-10-01 ENCOUNTER — Other Ambulatory Visit: Payer: Self-pay

## 2023-10-01 ENCOUNTER — Ambulatory Visit
Admission: RE | Admit: 2023-10-01 | Discharge: 2023-10-01 | Disposition: A | Discharge status: Home / Self Care | Attending: Gastroenterology | Admitting: Gastroenterology

## 2023-10-01 ENCOUNTER — Encounter: Payer: Self-pay | Admitting: Gastroenterology

## 2023-10-01 ENCOUNTER — Encounter
Admission: RE | Disposition: A | Discharge status: Home / Self Care | Payer: Self-pay | Source: Home / Self Care | Attending: Gastroenterology

## 2023-10-01 DIAGNOSIS — Z833 Family history of diabetes mellitus: Secondary | ICD-10-CM | POA: Insufficient documentation

## 2023-10-01 DIAGNOSIS — Z793 Long term (current) use of hormonal contraceptives: Secondary | ICD-10-CM | POA: Diagnosis not present

## 2023-10-01 DIAGNOSIS — J449 Chronic obstructive pulmonary disease, unspecified: Secondary | ICD-10-CM | POA: Diagnosis not present

## 2023-10-01 DIAGNOSIS — Z79890 Hormone replacement therapy (postmenopausal): Secondary | ICD-10-CM | POA: Diagnosis not present

## 2023-10-01 DIAGNOSIS — F419 Anxiety disorder, unspecified: Secondary | ICD-10-CM | POA: Diagnosis not present

## 2023-10-01 DIAGNOSIS — R0602 Shortness of breath: Secondary | ICD-10-CM | POA: Diagnosis not present

## 2023-10-01 DIAGNOSIS — Z860100 Personal history of colon polyps, unspecified: Secondary | ICD-10-CM | POA: Diagnosis not present

## 2023-10-01 DIAGNOSIS — I10 Essential (primary) hypertension: Secondary | ICD-10-CM | POA: Diagnosis not present

## 2023-10-01 DIAGNOSIS — F32A Depression, unspecified: Secondary | ICD-10-CM | POA: Insufficient documentation

## 2023-10-01 DIAGNOSIS — K635 Polyp of colon: Secondary | ICD-10-CM

## 2023-10-01 DIAGNOSIS — D124 Benign neoplasm of descending colon: Secondary | ICD-10-CM | POA: Insufficient documentation

## 2023-10-01 DIAGNOSIS — K640 First degree hemorrhoids: Secondary | ICD-10-CM | POA: Diagnosis not present

## 2023-10-01 DIAGNOSIS — Z1211 Encounter for screening for malignant neoplasm of colon: Secondary | ICD-10-CM | POA: Diagnosis not present

## 2023-10-01 DIAGNOSIS — E119 Type 2 diabetes mellitus without complications: Secondary | ICD-10-CM | POA: Diagnosis not present

## 2023-10-01 DIAGNOSIS — F17210 Nicotine dependence, cigarettes, uncomplicated: Secondary | ICD-10-CM | POA: Diagnosis not present

## 2023-10-01 DIAGNOSIS — E039 Hypothyroidism, unspecified: Secondary | ICD-10-CM | POA: Insufficient documentation

## 2023-10-01 DIAGNOSIS — Z79899 Other long term (current) drug therapy: Secondary | ICD-10-CM | POA: Insufficient documentation

## 2023-10-01 HISTORY — PX: POLYPECTOMY: SHX149

## 2023-10-01 HISTORY — PX: COLONOSCOPY: SHX5424

## 2023-10-01 LAB — GLUCOSE, CAPILLARY: Glucose-Capillary: 168 mg/dL — ABNORMAL HIGH (ref 70–99)

## 2023-10-01 SURGERY — COLONOSCOPY
Anesthesia: General

## 2023-10-01 MED ORDER — LIDOCAINE HCL (CARDIAC) PF 100 MG/5ML IV SOSY
PREFILLED_SYRINGE | INTRAVENOUS | Status: DC | PRN
  Administered 2023-10-01: 60 mg via INTRAVENOUS

## 2023-10-01 MED ORDER — PROPOFOL 10 MG/ML IV BOLUS
INTRAVENOUS | Status: DC | PRN
  Administered 2023-10-01: 40 mg via INTRAVENOUS
  Administered 2023-10-01: 20 mg via INTRAVENOUS

## 2023-10-01 MED ORDER — PROPOFOL 500 MG/50ML IV EMUL
INTRAVENOUS | Status: DC | PRN
  Administered 2023-10-01: 165 ug/kg/min via INTRAVENOUS

## 2023-10-01 MED ORDER — DEXMEDETOMIDINE HCL IN NACL 200 MCG/50ML IV SOLN
INTRAVENOUS | Status: DC | PRN
  Administered 2023-10-01: 12 ug via INTRAVENOUS

## 2023-10-01 MED ORDER — SODIUM CHLORIDE 0.9 % IV SOLN: INTRAVENOUS | Status: DC

## 2023-10-01 MED ORDER — GLYCOPYRROLATE 0.2 MG/ML IJ SOLN
INTRAMUSCULAR | Status: DC | PRN
  Administered 2023-10-01: .2 mg via INTRAVENOUS

## 2023-10-01 NOTE — H&P (Signed)
 Rogelia Copping, MD [facility] [address] [place], [place] [address] Phone: [phone] Fax : [phone]  Primary Care Physician:  Donzella Lauraine SAILOR, DO Primary Gastroenterologist:  Dr. Copping  Pre-Procedure History & Physical: HPI:  KRISTAIN FILO is a 58 y.o. female is here for a screening colonoscopy.   Past Medical History:  Diagnosis Date   Allergy    Anxiety    Arthritis    Chronic pain    COPD (chronic obstructive pulmonary disease) (HCC)    Diabetes mellitus without complication (HCC)    Dyspnea    GERD (gastroesophageal reflux disease)    Hypertension    Hypothyroidism    Neuromuscular disorder (HCC)    Pneumonia    Thyroid  disease     Past Surgical History:  Procedure Laterality Date   ABDOMINAL HYSTERECTOMY  02/11/1999   CESAREAN SECTION  02/11/1992   JOINT REPLACEMENT Right 2005, 2010   hip   PAIN PUMP IMPLANTATION  2008, 2014   Dr Nick   ROOT CANAL Left    04/2021   TONSILLECTOMY  02/11/1968   TOTAL HIP REVISION Right 04/05/2020   Procedure: Revision Right total hip arthroplasty conversion of metal to ceramic with debridement;  Surgeon: Ernie Cough, MD;  Location: [facility];  Service: Orthopedics;  Laterality: Right;  90 mins    Prior to Admission medications   Medication Sig Start Date End Date Taking? Authorizing Provider  albuterol  (VENTOLIN  HFA) 108 (90 Base) MCG/ACT inhaler Inhale 1-2 puffs into the lungs every 6 (six) hours as needed for wheezing or shortness of breath. 07/21/23   Donzella Lauraine SAILOR, DO  baclofen (LIORESAL) 0.05 MG/ML injection 1 mL (50 mcg total) by Intrathecal route once for 1 dose. In pain pump 03/16/20   Vivienne Nest M, PA-C  Blood Glucose Monitoring Suppl DEVI 1 each by Does not apply route daily before breakfast. May substitute to any manufacturer covered by patient's insurance. 07/21/23   Donzella Lauraine SAILOR, DO  Cholecalciferol 125 MCG (5000 UT) TABS Take 5,000 Units by mouth daily.    [provider]  cloNIDine  (CATAPRES) 0.1 MG tablet Take 1 tablet (0.1 mg total) by mouth 3 (three) times daily. In pain pump 03/16/20   Vivienne Nest M, PA-C  diazepam (VALIUM) 5 MG tablet Take 5 mg by mouth. 05/26/23   [provider]  empagliflozin  (JARDIANCE ) 25 MG TABS tablet Take 1 tablet (25 mg total) by mouth daily. 07/21/23   Donzella Lauraine SAILOR, DO  Glucose Blood (BLOOD GLUCOSE TEST STRIPS) STRP 1 each by In Vitro route daily before breakfast. May substitute to any manufacturer covered by patient's insurance. 07/21/23   Donzella Lauraine SAILOR, DO  HYDROmorphone  in sodium chloride  0.9 % 100 mL Inject into the vein continuous. In pain pump 03/16/20   Burnette, Jennifer M, PA-C  ibuprofen  (ADVIL ) 100 MG tablet 3 tablets 4 times a day by oral route.    [provider]  Lancet Device MISC 1 each by Does not apply route daily before breakfast. May substitute to any manufacturer covered by patient's insurance. 07/21/23   Donzella Lauraine SAILOR, DO  levothyroxine  (SYNTHROID ) 112 MCG tablet Take 1 tablet (112 mcg total) by mouth daily. 07/21/23   Donzella Lauraine SAILOR, DO  metFORMIN  (GLUCOPHAGE -XR) 500 MG 24 hr tablet Take 2 tablets (1,000 mg total) by mouth 2 (two) times daily with a meal. 07/21/23   Pardue, Lauraine SAILOR, DO  naloxone [facility]) nasal spray 4 mg/0.1 mL Place into the nose. 02/19/21  [provider]    Allergies as of 08/03/2023 - Review Complete 08/03/2023  Allergen Reaction Noted   Morphine Itching and Nausea And Vomiting 01/26/2015   Penicillins Rash 01/26/2015    Family History  Problem Relation Age of Onset   ADD / ADHD Mother    Arthritis Mother    Diabetes Father    ADD / ADHD Daughter    Breast cancer Neg Hx     Social History   Socioeconomic History   Marital status: Married    Spouse name: Not on file   Number of children: Not on file   Years of education: Not on file   Highest education level: Some college, no degree  Occupational History   Not on file  Tobacco Use   Smoking status:  Some Days    Current packs/day: 0.25    Average packs/day: 0.9 packs/day for 43.2 years (40.8 ttl pk-yrs)    Types: Cigarettes    Start date: 07/20/2020   Smokeless tobacco: Never   Tobacco comments:    3-4 a week.  Vaping Use   Vaping status: Never Used  Substance and Sexual Activity   Alcohol use: No   Drug use: No   Sexual activity: Yes    Birth control/protection: Post-menopausal  Other Topics Concern   Not on file  Social History Narrative   Not on file   Social Drivers of Health   Financial Resource Strain: Low Risk  (07/20/2023)   Overall Financial Resource Strain (CARDIA)    Difficulty of Paying Living Expenses: Not hard at all  Food Insecurity: No Food Insecurity (07/20/2023)   Hunger Vital Sign    Worried About Running Out of Food in the Last Year: Never true    Ran Out of Food in the Last Year: Never true  Transportation Needs: No Transportation Needs (07/20/2023)   PRAPARE - Administrator, Civil Service (Medical): No    Lack of Transportation (Non-Medical): No  Physical Activity: Sufficiently Active (07/20/2023)   Exercise Vital Sign    Days of Exercise per Week: 7 days    Minutes of Exercise per Session: 60 min  Stress: No Stress Concern Present (07/20/2023)   [employer] of Occupational Health - Occupational Stress Questionnaire    Feeling of Stress : Only a little  Social Connections: Moderately Integrated (07/20/2023)   Social Connection and Isolation Panel    Frequency of Communication with Friends and Family: More than three times a week    Frequency of Social Gatherings with Friends and Family: Once a week    Attends Religious Services: 1 to 4 times per year    Active Member of [employer] or Organizations: No    Attends Banker Meetings: Never    Marital Status: Married  Recent Concern: Social Connections - Moderately Isolated (06/08/2023)   Social Connection and Isolation Panel    Frequency of Communication with Friends and Family:  More than three times a week    Frequency of Social Gatherings with Friends and Family: More than three times a week    Attends Religious Services: Never    Database administrator or Organizations: No    Attends Banker Meetings: Never    Marital Status: Married  Catering manager Violence: Not At Risk (06/08/2023)   Humiliation, Afraid, Rape, and Kick questionnaire    Fear of Current or Ex-Partner: No    Emotionally Abused: No    Physically Abused: No  Sexually Abused: No    Review of Systems: See HPI, otherwise negative ROS  Physical Exam: BP (!) 141/86   Pulse 89   Temp (!) 97.3 F (36.3 C) (Temporal)   Resp 14   Ht 5' 4 (1.626 m)   Wt 49.9 kg   SpO2 100%   BMI 18.88 kg/m  General:   Alert,  pleasant and cooperative in NAD Head:  Normocephalic and atraumatic. Neck:  Supple; no masses or thyromegaly. Lungs:  Clear throughout to auscultation.    Heart:  Regular rate and rhythm. Abdomen:  Soft, nontender and nondistended. Normal bowel sounds, without guarding, and without rebound.   Neurologic:  Alert and  oriented x4;  grossly normal neurologically.  Impression/Plan: ANDROMEDA POPPEN is now here to undergo a screening colonoscopy.  Risks, benefits, and alternatives regarding colonoscopy have been reviewed with the patient.  Questions have been answered.  All parties agreeable.

## 2023-10-01 NOTE — Anesthesia Procedure Notes (Signed)
 Procedure Name: General with mask airway Date/Time: 10/01/2023 9:06 AM  Performed by: Ledora Duncan, CRNAPre-anesthesia Checklist: Patient identified, Emergency Drugs available, Suction available and Patient being monitored Patient Re-evaluated:Patient Re-evaluated prior to induction Oxygen Delivery Method: Simple face mask Induction Type: IV induction Placement Confirmation: positive ETCO2 and breath sounds checked- equal and bilateral Dental Injury: Teeth and Oropharynx as per pre-operative assessment

## 2023-10-01 NOTE — Transfer of Care (Signed)
 Immediate Anesthesia Transfer of Care Note  [name]  Procedure(s) Performed: COLONOSCOPY POLYPECTOMY, INTESTINE  Patient Location: Endoscopy Unit  Anesthesia Type:General  Level of Consciousness: drowsy and patient cooperative  Airway & Oxygen Therapy: Patient Spontanous Breathing and Patient connected to face mask oxygen  Post-op Assessment: Report given to RN and Post -op Vital signs reviewed and stable  Post vital signs: Reviewed and stable  Last Vitals:  Vitals Value Taken Time  BP    Temp    Pulse 90 10/01/23 09:18  Resp 17 10/01/23 09:18  SpO2 100 % 10/01/23 09:18    Last Pain:  Vitals:   10/01/23 0838  TempSrc: Temporal  PainSc: 9          Complications: No notable events documented.

## 2023-10-01 NOTE — Op Note (Signed)
 [facility] Gastroenterology Patient Name: Tasha Nichols Procedure Date: 10/01/2023 8:51 AM MRN: [id number] Account #: [id number] Date of Birth: [birth year] Admit Type: Outpatient Age: 58 Room: [facility] ENDO ROOM 4 Gender: Female Note Status: Finalized Instrument Name: Colon Scope [id number] Procedure:             Colonoscopy Indications:           Screening for colorectal malignant neoplasm Providers:             Rogelia Copping MD, MD Referring MD:          Inocente DOROTHA Stai, MD (Referring MD) Medicines:             Propofol  per Anesthesia Complications:         No immediate complications. Procedure:             Pre-Anesthesia Assessment:                        - Prior to the procedure, a History and Physical was                         performed, and patient medications and allergies were                         reviewed. The patient's tolerance of previous                         anesthesia was also reviewed. The risks and benefits                         of the procedure and the sedation options and risks                         were discussed with the patient. All questions were                         answered, and informed consent was obtained. Prior                         Anticoagulants: The patient has taken no anticoagulant                         or antiplatelet agents. ASA Grade Assessment: II - A                         patient with mild systemic disease. After reviewing                         the risks and benefits, the patient was deemed in                         satisfactory condition to undergo the procedure.                        After obtaining informed consent, the colonoscope was                         passed under direct vision. Throughout the procedure,  the patient's blood pressure, pulse, and oxygen                         saturations were monitored continuously. The                         Colonoscope was introduced through  the anus and                         advanced to the the cecum, identified by appendiceal                         orifice and ileocecal valve. The colonoscopy was                         performed without difficulty. The patient tolerated                         the procedure well. The quality of the bowel                         preparation was excellent. Findings:      The perianal and digital rectal examinations were normal.      A 2 mm polyp was found in the descending colon. The polyp was sessile.       The polyp was removed with a cold biopsy forceps. Resection and       retrieval were complete.      Non-bleeding internal hemorrhoids were found during retroflexion. The       hemorrhoids were Grade I (internal hemorrhoids that do not prolapse). Impression:            - One 2 mm polyp in the descending colon, removed with                         a cold biopsy forceps. Resected and retrieved.                        - Non-bleeding internal hemorrhoids. Recommendation:        - Discharge patient to home.                        - Resume previous diet.                        - Continue present medications.                        - Await pathology results.                        - If the pathology report reveals adenomatous tissue,                         then repeat the colonoscopy for surveillance in 7                         years. Procedure Code(s):     --- Professional ---  54619, Colonoscopy, flexible; with biopsy, single or                         multiple Diagnosis Code(s):     --- Professional ---                        Z12.11, Encounter for screening for malignant neoplasm                         of colon                        D12.4, Benign neoplasm of descending colon CPT copyright 2022 American Medical Association. All rights reserved. The codes documented in this report are preliminary and upon coder review may  be revised to meet current compliance  requirements. Rogelia Copping MD, MD 10/01/2023 9:15:57 AM This report has been signed electronically. Number of Addenda: 0 Note Initiated On: 10/01/2023 8:51 AM Scope Withdrawal Time: 0 hours 8 minutes 6 seconds  Total Procedure Duration: 0 hours 9 minutes 55 seconds  Estimated Blood Loss:  Estimated blood loss: none.      [facility]

## 2023-10-02 NOTE — Anesthesia Postprocedure Evaluation (Signed)
 Anesthesia Post Note  [name]  Procedure(s) Performed: COLONOSCOPY POLYPECTOMY, INTESTINE  Patient location during evaluation: PACU Anesthesia Type: General Level of consciousness: awake and alert Pain management: pain level controlled Vital Signs Assessment: post-procedure vital signs reviewed and stable Respiratory status: spontaneous breathing, nonlabored ventilation and respiratory function stable Cardiovascular status: blood pressure returned to baseline and stable Postop Assessment: no apparent nausea or vomiting Anesthetic complications: no   No notable events documented.   Last Vitals:  Vitals:   10/01/23 0930 10/01/23 0949  BP: 109/70 114/79  Pulse: 90 91  Resp: 16 16  Temp: (!) 36.1 C (!) 36.1 C  SpO2: 100% 99%    Last Pain:  Vitals:   10/02/23 0710  TempSrc:   PainSc: 0-No pain                 Camellia Merilee Louder

## 2023-10-05 DIAGNOSIS — E785 Hyperlipidemia, unspecified: Secondary | ICD-10-CM | POA: Diagnosis not present

## 2023-10-05 DIAGNOSIS — I700 Atherosclerosis of aorta: Secondary | ICD-10-CM | POA: Diagnosis not present

## 2023-10-05 DIAGNOSIS — E1142 Type 2 diabetes mellitus with diabetic polyneuropathy: Secondary | ICD-10-CM | POA: Diagnosis not present

## 2023-10-05 DIAGNOSIS — F419 Anxiety disorder, unspecified: Secondary | ICD-10-CM | POA: Diagnosis not present

## 2023-10-05 DIAGNOSIS — J449 Chronic obstructive pulmonary disease, unspecified: Secondary | ICD-10-CM | POA: Diagnosis not present

## 2023-10-05 DIAGNOSIS — M1990 Unspecified osteoarthritis, unspecified site: Secondary | ICD-10-CM | POA: Diagnosis not present

## 2023-10-05 DIAGNOSIS — R030 Elevated blood-pressure reading, without diagnosis of hypertension: Secondary | ICD-10-CM | POA: Diagnosis not present

## 2023-10-05 DIAGNOSIS — F325 Major depressive disorder, single episode, in full remission: Secondary | ICD-10-CM | POA: Diagnosis not present

## 2023-10-05 DIAGNOSIS — Z8249 Family history of ischemic heart disease and other diseases of the circulatory system: Secondary | ICD-10-CM | POA: Diagnosis not present

## 2023-10-06 ENCOUNTER — Encounter: Admitting: Obstetrics

## 2023-10-06 LAB — SURGICAL PATHOLOGY

## 2023-10-21 ENCOUNTER — Encounter: Payer: Self-pay | Admitting: Family Medicine

## 2023-10-21 ENCOUNTER — Ambulatory Visit: Admitting: Family Medicine

## 2023-10-21 VITALS — BP 118/75 | HR 82 | Ht 64.0 in | Wt 112.0 lb

## 2023-10-21 DIAGNOSIS — Z5320 Procedure and treatment not carried out because of patient's decision for unspecified reasons: Secondary | ICD-10-CM

## 2023-10-21 DIAGNOSIS — F17210 Nicotine dependence, cigarettes, uncomplicated: Secondary | ICD-10-CM | POA: Diagnosis not present

## 2023-10-21 DIAGNOSIS — Z716 Tobacco abuse counseling: Secondary | ICD-10-CM

## 2023-10-21 DIAGNOSIS — E1165 Type 2 diabetes mellitus with hyperglycemia: Secondary | ICD-10-CM

## 2023-10-21 DIAGNOSIS — J449 Chronic obstructive pulmonary disease, unspecified: Secondary | ICD-10-CM | POA: Diagnosis not present

## 2023-10-21 DIAGNOSIS — E039 Hypothyroidism, unspecified: Secondary | ICD-10-CM

## 2023-10-21 LAB — POCT GLYCOSYLATED HEMOGLOBIN (HGB A1C): Hemoglobin A1C: 8 % — AB (ref 4.0–5.6)

## 2023-10-21 MED ORDER — BUDESONIDE-FORMOTEROL FUMARATE 160-4.5 MCG/ACT IN AERO
2.0000 | INHALATION_SPRAY | Freq: Two times a day (BID) | RESPIRATORY_TRACT | 5 refills | Status: AC
Start: 2023-10-21 — End: ?

## 2023-10-21 MED ORDER — LISINOPRIL 2.5 MG PO TABS
2.5000 mg | ORAL_TABLET | Freq: Every day | ORAL | 3 refills | Status: AC
Start: 2023-10-21 — End: ?

## 2023-10-21 MED ORDER — COVID-19 MRNA VAC-TRIS(PFIZER) 30 MCG/0.3ML IM SUSY: 0.3000 mL | PREFILLED_SYRINGE | Freq: Once | INTRAMUSCULAR | 0 refills | Status: AC

## 2023-10-21 MED ORDER — SITAGLIPTIN PHOSPHATE 100 MG PO TABS: 100.0000 mg | ORAL_TABLET | Freq: Every day | ORAL | 3 refills | Status: AC

## 2023-10-21 NOTE — Progress Notes (Signed)
 Established patient visit   [name]   DOB: 1966-01-04   58 y.o. Female  MRN: [id number] Visit Date: 10/21/2023  Today's healthcare provider: LAURAINE LOISE BUOY, DO   Chief Complaint  Patient presents with   Diabetes    Discuss how to use the meter     Subjective    HPI Tasha Nichols is a 58 year old female with diabetes who presents for diabetes management and blood sugar monitoring education.  She has a history of diabetes diagnosed a couple of years ago and is currently on Jardiance  and metformin  for management. She takes metformin  four times a day and Jardiance  on an empty stomach. She has not been regularly checking her blood sugar due to lack of instruction on using her glucometer. Recently, a home healthcare provider showed her how to use it, but she is unsure about the target blood sugar levels. Her last A1c was 7.5%.  She has experienced weight loss since 2022, which she attributes to starting metformin . Her current weight is 112 pounds. Her husband is concerned about her being 'too skinny.'  She describes herself as a 'closet smoker' and mentions her husband also smokes, which complicates her efforts to quit. She smokes off and on, with some days not smoking at all.  She has COPD and uses an albuterol  inhaler as needed, with usage varying depending on the weather and her symptoms. She has not been on a long-term inhaler with an inhaled corticosteroid.  She has a history of dental issues, including a recent cracked crown and infection following a root canal, which required dental intervention.       Medications: Outpatient Medications Prior to Visit  Medication Sig   albuterol  (VENTOLIN  HFA) 108 (90 Base) MCG/ACT inhaler Inhale 1-2 puffs into the lungs every 6 (six) hours as needed for wheezing or shortness of breath.   baclofen (LIORESAL) 0.05 MG/ML injection 1 mL (50 mcg total) by Intrathecal route once for 1 dose. In pain pump   Blood Glucose  Monitoring Suppl DEVI 1 each by Does not apply route daily before breakfast. May substitute to any manufacturer covered by patient's insurance.   Cholecalciferol 125 MCG (5000 UT) TABS Take 5,000 Units by mouth daily.   cloNIDine (CATAPRES) 0.1 MG tablet Take 1 tablet (0.1 mg total) by mouth 3 (three) times daily. In pain pump   diazepam (VALIUM) 5 MG tablet Take 5 mg by mouth.   empagliflozin  (JARDIANCE ) 25 MG TABS tablet Take 1 tablet (25 mg total) by mouth daily.   Glucose Blood (BLOOD GLUCOSE TEST STRIPS) STRP 1 each by In Vitro route daily before breakfast. May substitute to any manufacturer covered by patient's insurance.   HYDROmorphone  in sodium chloride  0.9 % 100 mL Inject into the vein continuous. In pain pump   ibuprofen  (ADVIL ) 100 MG tablet 3 tablets 4 times a day by oral route.   Lancet Device MISC 1 each by Does not apply route daily before breakfast. May substitute to any manufacturer covered by patient's insurance.   levothyroxine  (SYNTHROID ) 112 MCG tablet Take 1 tablet (112 mcg total) by mouth daily.   metFORMIN  (GLUCOPHAGE -XR) 500 MG 24 hr tablet Take 2 tablets (1,000 mg total) by mouth 2 (two) times daily with a meal.   naloxone (NARCAN) nasal spray 4 mg/0.1 mL Place into the nose.   No facility-administered medications prior to visit.        Objective    BP 118/75 (BP Location:  Right Arm, Patient Position: Sitting, Cuff Size: Normal)   Pulse 82   Ht 5' 4 (1.626 m)   Wt 112 lb (50.8 kg)   SpO2 99%   BMI 19.22 kg/m     Physical Exam Vitals and nursing note reviewed.  Constitutional:      General: She is not in acute distress.    Appearance: Normal appearance.  HENT:     Head: Normocephalic and atraumatic.  Eyes:     General: No scleral icterus.    Conjunctiva/sclera: Conjunctivae normal.  Cardiovascular:     Rate and Rhythm: Normal rate.  Pulmonary:     Effort: Pulmonary effort is normal.  Neurological:     Mental Status: She is alert and oriented to  person, place, and time. Mental status is at baseline.  Psychiatric:        Mood and Affect: Mood normal.        Behavior: Behavior normal.      Results for orders placed or performed in visit on 10/21/23  POCT glycosylated hemoglobin (Hb A1C)  Result Value Ref Range   Hemoglobin A1C 8.0 (A) 4.0 - 5.6 %   HbA1c POC (<> result, manual entry)     HbA1c, POC (prediabetic range)     HbA1c, POC (controlled diabetic range)      Assessment & Plan    Type 2 diabetes mellitus with hyperglycemia, without long-term current use of insulin  (HCC) Assessment & Plan: A1c 8.0 today. Continue Jardiance  25 mg daily and metformin -XR 1000 mg twice daily. Start Januvia  100 mg daily  Orders: -     POCT glycosylated hemoglobin (Hb A1C) -     Lisinopril ; Take 1 tablet (2.5 mg total) by mouth daily.  Dispense: 90 tablet; Refill: 3 -     SITagliptin  Phosphate; Take 1 tablet (100 mg total) by mouth daily.  Dispense: 90 tablet; Refill: 3  Chronic obstructive pulmonary disease, unspecified COPD type (HCC) -     Budesonide -Formoterol  Fumarate; Inhale 2 puffs into the lungs 2 (two) times daily.  Dispense: 1 each; Refill: 5  Tobacco dependence due to cigarettes  Encounter for smoking cessation counseling  Lung cancer screening declined by patient  Other orders -     COVID-19 mRNA Vac-TriS(Pfizer); Inject 0.3 mLs into the muscle once for 1 dose.  Dispense: 0.3 mL; Refill: 0      Type 2 diabetes mellitus with hyperglycemia, without long-term current use of insulin  Type 2 diabetes with A1c of 7.5. Blood glucose target 70-140 mg/dL. Uninformed about glucometer use. On Jardiance  and metformin , with metformin  aiding weight loss. - Continue Jardiance  and metformin . - Start daily blood glucose monitoring, ideally pre-breakfast. - Reduce dietary sugars and carbohydrates. - Schedule diabetic eye exam; inform ophthalmologist of diabetes. - Send COVID booster prescription to [facility] on [address].  Chronic  obstructive pulmonary disease (COPD) COPD managed with albuterol . Discussed Symbicort  for long-term management and inflammation control. No prior long-term inhaler use. - Prescribe Symbicort  inhaler for as-needed or daily use if symptoms worsen. - Continue albuterol  inhaler as needed. - Consider allergy medication during symptomatic seasons.  Tobacco dependence due to cigarettes; encounter for smoking cessation; lung cancer screening declined by patient Nicotine dependence with intermittent smoking. Challenges quitting due to husband's smoking. Open to cessation discussion. - Discuss smoking cessation options and support if interested.    Return in about 3 months (around 01/20/2024) for DM.      I discussed the assessment and treatment plan with the patient  The  patient was provided an opportunity to ask questions and all were answered. The patient agreed with the plan and demonstrated an understanding of the instructions.   The patient was advised to call back or seek an in-person evaluation if the symptoms worsen or if the condition fails to improve as anticipated.    LAURAINE LOISE BUOY, DO  [facility] [phone] (phone) [phone] (fax)  [facility]

## 2023-10-21 NOTE — Patient Instructions (Addendum)
 We do not want your blood sugar below 70.  If you feel lightheaded, dizzy, start sweating/have cold sweats or have vision changes, please check your blood sugar.  If less than 70, please drink juice and/or eat something sweet.  We do not want your blood to be over 140.  If it is, we will need to focus on carbohydrates in your diet, especially simple and added sugars.   Try erythritol/monk fruit as a sugar substitute.

## 2023-10-21 NOTE — Assessment & Plan Note (Addendum)
 A1c 8.0 today. Continue Jardiance  25 mg daily and metformin -XR 1000 mg twice daily. Start Januvia  100 mg daily

## 2023-10-22 ENCOUNTER — Ambulatory Visit: Payer: Self-pay | Admitting: Gastroenterology

## 2023-10-27 DIAGNOSIS — Z79899 Other long term (current) drug therapy: Secondary | ICD-10-CM | POA: Diagnosis not present

## 2023-10-27 DIAGNOSIS — Z791 Long term (current) use of non-steroidal anti-inflammatories (NSAID): Secondary | ICD-10-CM | POA: Diagnosis not present

## 2023-10-27 DIAGNOSIS — Z978 Presence of other specified devices: Secondary | ICD-10-CM | POA: Diagnosis not present

## 2023-10-27 DIAGNOSIS — M1610 Unilateral primary osteoarthritis, unspecified hip: Secondary | ICD-10-CM | POA: Diagnosis not present

## 2023-10-27 DIAGNOSIS — G894 Chronic pain syndrome: Secondary | ICD-10-CM | POA: Diagnosis not present

## 2023-10-27 DIAGNOSIS — F419 Anxiety disorder, unspecified: Secondary | ICD-10-CM | POA: Diagnosis not present

## 2023-10-27 DIAGNOSIS — F1190 Opioid use, unspecified, uncomplicated: Secondary | ICD-10-CM | POA: Diagnosis not present

## 2023-10-28 DIAGNOSIS — G894 Chronic pain syndrome: Secondary | ICD-10-CM | POA: Diagnosis not present

## 2023-10-28 DIAGNOSIS — F1190 Opioid use, unspecified, uncomplicated: Secondary | ICD-10-CM | POA: Diagnosis not present

## 2023-10-28 DIAGNOSIS — F419 Anxiety disorder, unspecified: Secondary | ICD-10-CM | POA: Diagnosis not present

## 2023-10-28 DIAGNOSIS — M1610 Unilateral primary osteoarthritis, unspecified hip: Secondary | ICD-10-CM | POA: Diagnosis not present

## 2023-11-05 ENCOUNTER — Encounter: Payer: Self-pay | Admitting: Family Medicine

## 2023-11-11 ENCOUNTER — Telehealth: Payer: Self-pay | Admitting: Family Medicine

## 2023-11-11 ENCOUNTER — Other Ambulatory Visit: Payer: Self-pay

## 2023-11-11 DIAGNOSIS — J449 Chronic obstructive pulmonary disease, unspecified: Secondary | ICD-10-CM

## 2023-11-11 MED ORDER — ALBUTEROL SULFATE HFA 108 (90 BASE) MCG/ACT IN AERS: 1.0000 | INHALATION_SPRAY | Freq: Four times a day (QID) | RESPIRATORY_TRACT | 2 refills | Status: AC | PRN

## 2023-11-11 NOTE — Telephone Encounter (Signed)
 Converted into refill request.

## 2023-11-11 NOTE — Telephone Encounter (Signed)
[facility] faxed refill request for the following medications:  albuterol (VENTOLIN HFA) 108 (90 Base) MCG/ACT inhaler   Please advise.

## 2023-11-16 NOTE — Progress Notes (Signed)
 Tasha Nichols                                          MRN: [id number]   11/16/2023   The [facility] Quality Team Specialist reviewed this patient medical record for the purposes of chart review for care gap closure. The following were reviewed: chart review for care gap closure-diabetic eye exam.    [facility] Quality Team

## 2023-11-27 DIAGNOSIS — F1190 Opioid use, unspecified, uncomplicated: Secondary | ICD-10-CM | POA: Diagnosis not present

## 2023-11-27 DIAGNOSIS — G894 Chronic pain syndrome: Secondary | ICD-10-CM | POA: Diagnosis not present

## 2023-11-27 DIAGNOSIS — M1610 Unilateral primary osteoarthritis, unspecified hip: Secondary | ICD-10-CM | POA: Diagnosis not present

## 2023-11-27 DIAGNOSIS — F419 Anxiety disorder, unspecified: Secondary | ICD-10-CM | POA: Diagnosis not present

## 2023-12-16 DIAGNOSIS — G894 Chronic pain syndrome: Secondary | ICD-10-CM | POA: Diagnosis not present

## 2023-12-16 DIAGNOSIS — Z79899 Other long term (current) drug therapy: Secondary | ICD-10-CM | POA: Diagnosis not present

## 2023-12-16 DIAGNOSIS — Z5181 Encounter for therapeutic drug level monitoring: Secondary | ICD-10-CM | POA: Diagnosis not present

## 2023-12-16 DIAGNOSIS — Z978 Presence of other specified devices: Secondary | ICD-10-CM | POA: Diagnosis not present

## 2023-12-16 DIAGNOSIS — M1610 Unilateral primary osteoarthritis, unspecified hip: Secondary | ICD-10-CM | POA: Diagnosis not present

## 2023-12-16 DIAGNOSIS — M1611 Unilateral primary osteoarthritis, right hip: Secondary | ICD-10-CM | POA: Diagnosis not present

## 2024-01-03 ENCOUNTER — Emergency Department

## 2024-01-03 ENCOUNTER — Emergency Department: Admission: EM | Admit: 2024-01-03 | Discharge: 2024-01-03 | Disposition: A | Discharge status: Home / Self Care

## 2024-01-03 ENCOUNTER — Encounter: Payer: Self-pay | Admitting: Intensive Care

## 2024-01-03 ENCOUNTER — Other Ambulatory Visit: Payer: Self-pay

## 2024-01-03 DIAGNOSIS — J441 Chronic obstructive pulmonary disease with (acute) exacerbation: Secondary | ICD-10-CM | POA: Insufficient documentation

## 2024-01-03 DIAGNOSIS — R0602 Shortness of breath: Secondary | ICD-10-CM | POA: Diagnosis not present

## 2024-01-03 LAB — CBC
HCT: 49.1 % — ABNORMAL HIGH (ref 36.0–46.0)
Hemoglobin: 16.4 g/dL — ABNORMAL HIGH (ref 12.0–15.0)
MCH: 30.6 pg (ref 26.0–34.0)
MCHC: 33.4 g/dL (ref 30.0–36.0)
MCV: 91.6 fL (ref 80.0–100.0)
Platelets: 241 K/uL (ref 150–400)
RBC: 5.36 MIL/uL — ABNORMAL HIGH (ref 3.87–5.11)
RDW: 12.9 % (ref 11.5–15.5)
WBC: 11.2 K/uL — ABNORMAL HIGH (ref 4.0–10.5)
nRBC: 0 % (ref 0.0–0.2)

## 2024-01-03 LAB — BASIC METABOLIC PANEL WITH GFR
Anion gap: 14 (ref 5–15)
BUN: 15 mg/dL (ref 6–20)
CO2: 23 mmol/L (ref 22–32)
Calcium: 9.9 mg/dL (ref 8.9–10.3)
Chloride: 98 mmol/L (ref 98–111)
Creatinine, Ser: 1.05 mg/dL — ABNORMAL HIGH (ref 0.44–1.00)
GFR, Estimated: >60 mL/min (ref >60)
Glucose, Bld: 197 mg/dL — ABNORMAL HIGH (ref 70–99)
Potassium: 4.4 mmol/L (ref 3.5–5.1)
Sodium: 135 mmol/L (ref 135–145)

## 2024-01-03 MED ORDER — METHYLPREDNISOLONE SODIUM SUCC 125 MG IJ SOLR
125.0000 mg | Freq: Once | INTRAMUSCULAR | Status: AC
  Administered 2024-01-03: 125 mg via INTRAVENOUS
  Filled 2024-01-03: qty 2

## 2024-01-03 MED ORDER — IPRATROPIUM-ALBUTEROL 0.5-2.5 (3) MG/3ML IN SOLN
3.0000 mL | Freq: Once | RESPIRATORY_TRACT | Status: AC
  Administered 2024-01-03: 3 mL via RESPIRATORY_TRACT
  Filled 2024-01-03: qty 3

## 2024-01-03 MED ORDER — AZITHROMYCIN 250 MG PO TABS
ORAL_TABLET | ORAL | 0 refills | Status: AC
Start: 2024-01-03 — End: ?

## 2024-01-03 MED ORDER — PREDNISONE 10 MG PO TABS: 50.0000 mg | ORAL_TABLET | Freq: Every day | ORAL | 0 refills | Status: AC

## 2024-01-03 MED ORDER — SODIUM CHLORIDE 0.9 % IV SOLN
500.0000 mg | Freq: Once | INTRAVENOUS | Status: AC
  Administered 2024-01-03: 500 mg via INTRAVENOUS
  Filled 2024-01-03: qty 5

## 2024-01-03 NOTE — Discharge Instructions (Signed)
 You were seen today due to concern of shortness of breath.  I suspect this was likely due to worsening of your COPD.  I have written for some steroids and some antibiotics for you to take, please take these as instructed.  If you have any worsening of symptoms such as increased shortness of breath, chest pain, lightheadedness, or any other symptoms you find concerning please return to the emergency department immediately for further medical management.

## 2024-01-03 NOTE — ED Notes (Signed)
 Called ccmd for monitoring

## 2024-01-03 NOTE — ED Provider Notes (Signed)
 [facility] Provider Note    Event Date/Time   First MD Initiated Contact with Patient 01/03/24 [phone]     (approximate)   History   Shortness of Breath   HPI  Tasha Nichols is a 58 y.o. female history of COPD who presents with concern of shortness of breath.  On Tuesday developed symptoms of cough and rhinorrhea, and felt that this may be just from her sinuses, however this morning woke up with significant increase of shortness of breath with minimal exertion.  When she comes in from the waiting room, she becomes very winded.  She denies any swelling in her extremities, no affiliated chest pain.  She does have a history of COPD and occasionally uses her rescue inhaler but primarily has been using her budesonide  which she was started on a few weeks ago.  No prior history of PEs or DVTs, no recent surgeries, no hormonal therapy.     Physical Exam   Triage Vital Signs: ED Triage Vitals  Encounter Vitals Group     BP 01/03/24 0718 110/79     Girls Systolic BP Percentile --      Girls Diastolic BP Percentile --      Boys Systolic BP Percentile --      Boys Diastolic BP Percentile --      Pulse Rate 01/03/24 0718 (!) 113     Resp 01/03/24 0718 20     Temp 01/03/24 0718 97.6 F (36.4 C)     Temp Source 01/03/24 0718 Oral     SpO2 01/03/24 0718 97 %     Weight 01/03/24 0719 109 lb (49.4 kg)     Height 01/03/24 0719 5' 4 (1.626 m)     Head Circumference --      Peak Flow --      Pain Score 01/03/24 0719 0     Pain Loc --      Pain Education --      Exclude from Growth Chart --     Most recent vital signs: Vitals:   01/03/24 1015 01/03/24 1131  BP:    Pulse: 84   Resp: 17   Temp:  97.7 F (36.5 C)  SpO2: 97%      General: Awake, no distress.  CV:  Good peripheral perfusion.  Regular rate Resp:  Tachypneic, having trouble speaking more than 2-3 word sentences, faint wheezes appreciated in the lower lobes, but seems to be moving air in the  upper lobes appropriately Abd:  No distention.  Soft nontender Other:     ED Results / Procedures / Treatments   Labs (all labs ordered are listed, but only abnormal results are displayed) Labs Reviewed  BASIC METABOLIC PANEL WITH GFR - Abnormal; Notable for the following components:      Result Value   Glucose, Bld 197 (*)    Creatinine, Ser 1.05 (*)    All other components within normal limits  CBC - Abnormal; Notable for the following components:   WBC 11.2 (*)    RBC 5.36 (*)    Hemoglobin 16.4 (*)    HCT 49.1 (*)    All other components within normal limits     EKG  Sinus rhythm with a rate of about 80, axis of 98, intervals appear to be within normal limits, no obvious ischemia appreciated on this EKG   RADIOLOGY No acute findings  PROCEDURES:  Critical Care performed: Yes, see critical care procedure note(s)  .Critical Care  Performed by: Fernand Rossie HERO, MD Authorized by: Fernand Rossie HERO, MD   Critical care provider statement:    Critical care time (minutes):  30   Critical care was necessary to treat or prevent imminent or life-threatening deterioration of the following conditions:  Respiratory failure   Critical care was time spent personally by me on the following activities:  Development of treatment plan with patient or surrogate, discussions with consultants, evaluation of patient's response to treatment, examination of patient, ordering and review of laboratory studies, ordering and review of radiographic studies, ordering and performing treatments and interventions, pulse oximetry, re-evaluation of patient's condition and review of old charts    MEDICATIONS ORDERED IN ED: Medications  ipratropium-albuterol  (DUONEB) 0.5-2.5 (3) MG/3ML nebulizer solution 3 mL (3 mLs Nebulization Given 01/03/24 0819)  methylPREDNISolone  sodium succinate (SOLU-MEDROL ) 125 mg/2 mL injection 125 mg (125 mg Intravenous Given 01/03/24 0829)  ipratropium-albuterol  (DUONEB)  0.5-2.5 (3) MG/3ML nebulizer solution 3 mL (3 mLs Nebulization Given 01/03/24 0927)  azithromycin  (ZITHROMAX ) 500 mg in sodium chloride  0.9 % 250 mL IVPB (0 mg Intravenous Stopped 01/03/24 1132)     IMPRESSION / MDM / ASSESSMENT AND PLAN / ED COURSE  I reviewed the triage vital signs and the nursing notes.                               Patient's presentation is most consistent with acute presentation with potential threat to life or bodily function.  58 year old female who presents today with concern of shortness of breath.  She is tachypneic here upon arrival, she is saturating appropriately, clinically at this appears consistent with a COPD exacerbation most likely.  PE is also a consideration, but given the clinical exam I believe more reasonable to start with a course of a nebulizer here as well as steroids and assess for response to treatment.  If continues to be tachypneic despite this, may warrant further imaging and testing.  I feel ACS unlikely, EKG is nonischemic finding.  Will follow-up response to treatment and determine safe disposition accordingly.   Clinical Course as of 01/03/24 1614  Sun Jan 03, 2024  9153 Patient's work of breathing has improved after nebulizer.  She is visibly much less tachypneic now.  Will give her a second nebulizer, and also give her a dose of azithromycin  as this is likely a COPD exacerbation.  Will have her ambulate afterwards and determine safe disposition. [SK]  1031 The patient's work of breathing is significantly improved, she is saturating appropriately, I do suspect likely a COPD exacerbation.  She has adequate albuterol  inhalers at home, we will have her discharged with a course of steroids and azithromycin  for home.  I discussed return precautions, she verbalized understanding and is agreeable with the plan. [SK]  1127 Patient was ambulating and noted to have some increased work of breathing, and oxygen desaturation.  I spoke with the patient, I  offered admission given that she is continuing to have symptoms, patient adamant that she would like to leave now as she feels much better and she can pick up her home medications.  I explained to her risk of her increased shortness of breath, and the possible difficulty for her to return.  She verbalizes understanding, but she would still like to leave at this time. [SK]    Clinical Course User Index [SK] Fernand Rossie HERO, MD     FINAL CLINICAL IMPRESSION(S) / ED DIAGNOSES  Final diagnoses:  COPD exacerbation (HCC)     Rx / DC Orders   ED Discharge Orders          Ordered    azithromycin  (ZITHROMAX ) 250 MG tablet        01/03/24 1032    predniSONE  (DELTASONE ) 10 MG tablet  Daily        01/03/24 1032             Note:  This document was prepared using Dragon voice recognition software and may include unintentional dictation errors.   Fernand Rossie HERO, MD 01/03/24 [phone]

## 2024-01-03 NOTE — ED Triage Notes (Signed)
 Patient reports having a cold that started Tuesday. Yesterday started having SOB.   Patient has had pain pump for years due to hip

## 2024-01-20 ENCOUNTER — Ambulatory Visit: Admitting: Family Medicine

## 2024-01-26 DIAGNOSIS — Z79899 Other long term (current) drug therapy: Secondary | ICD-10-CM | POA: Diagnosis not present

## 2024-01-26 DIAGNOSIS — M1611 Unilateral primary osteoarthritis, right hip: Secondary | ICD-10-CM | POA: Diagnosis not present

## 2024-01-26 DIAGNOSIS — G894 Chronic pain syndrome: Secondary | ICD-10-CM | POA: Diagnosis not present

## 2024-01-26 DIAGNOSIS — Z978 Presence of other specified devices: Secondary | ICD-10-CM | POA: Diagnosis not present

## 2024-01-27 ENCOUNTER — Ambulatory Visit: Admitting: Family Medicine

## 2024-01-27 ENCOUNTER — Encounter: Payer: Self-pay | Admitting: Family Medicine

## 2024-01-27 VITALS — BP 107/74 | HR 93 | Temp 98.3°F | Ht 64.0 in | Wt 103.2 lb

## 2024-01-27 DIAGNOSIS — Z79899 Other long term (current) drug therapy: Secondary | ICD-10-CM | POA: Diagnosis not present

## 2024-01-27 DIAGNOSIS — E039 Hypothyroidism, unspecified: Secondary | ICD-10-CM | POA: Diagnosis not present

## 2024-01-27 DIAGNOSIS — F1190 Opioid use, unspecified, uncomplicated: Secondary | ICD-10-CM | POA: Diagnosis not present

## 2024-01-27 DIAGNOSIS — E559 Vitamin D deficiency, unspecified: Secondary | ICD-10-CM | POA: Diagnosis not present

## 2024-01-27 DIAGNOSIS — G894 Chronic pain syndrome: Secondary | ICD-10-CM | POA: Diagnosis not present

## 2024-01-27 DIAGNOSIS — Z7984 Long term (current) use of oral hypoglycemic drugs: Secondary | ICD-10-CM | POA: Diagnosis not present

## 2024-01-27 DIAGNOSIS — E1165 Type 2 diabetes mellitus with hyperglycemia: Secondary | ICD-10-CM | POA: Diagnosis not present

## 2024-01-27 DIAGNOSIS — E1169 Type 2 diabetes mellitus with other specified complication: Secondary | ICD-10-CM | POA: Diagnosis not present

## 2024-01-27 DIAGNOSIS — E785 Hyperlipidemia, unspecified: Secondary | ICD-10-CM

## 2024-01-27 DIAGNOSIS — Z5320 Procedure and treatment not carried out because of patient's decision for unspecified reasons: Secondary | ICD-10-CM

## 2024-01-27 DIAGNOSIS — F419 Anxiety disorder, unspecified: Secondary | ICD-10-CM | POA: Diagnosis not present

## 2024-01-27 DIAGNOSIS — M1610 Unilateral primary osteoarthritis, unspecified hip: Secondary | ICD-10-CM | POA: Diagnosis not present

## 2024-01-27 DIAGNOSIS — Z23 Encounter for immunization: Secondary | ICD-10-CM

## 2024-01-27 NOTE — Assessment & Plan Note (Signed)
 Chronic, managed with diet/exercise. Last LDL 66 without medication.

## 2024-01-27 NOTE — Progress Notes (Signed)
 "     Established patient visit   [name]   DOB: [birth year]   58 y.o. Female  MRN: [id number] Visit Date: 01/27/2024  Today's healthcare provider: LAURAINE LOISE BUOY, DO   Chief Complaint  Patient presents with   Medical Management of Chronic Issues    Patient is here today for a 3 month follow up on chronic management.  Diabetic Eye Exam- needs to schedule   Subjective    HPI Last annual exam: 07/21/2023   Tasha Nichols is a 58 year old female with diabetes who presents for a regular follow-up visit.  She has not scheduled her eye exam or mammogram. Her routine blood sugar monitoring was disrupted due to an illness before Thanksgiving, but she is now resuming regular checks. Her last hemoglobin A1c was 8% three months ago. She was prescribed Januvia  (sitagliptin ) and Jardiance  (empagliflozin ) but ran out of Januvia  during her illness and has not refilled it yet. She continues to take lisinopril  from the original prescription.  She was treated with azithromycin  and prednisone  for a recent illness characterized by sinus drainage and respiratory symptoms. Her breathing has improved since then. The illness lasted from late October into November and was described as 'awful'.  She has a history of vitamin D  deficiency and has run out of her vitamin D  supplements. She plans to refill her medications, including vitamin D , this week. She is not currently taking vitamin B12, although she is on metformin , which can reduce B12 absorption. She was previously on a daily vitamin D  supplement and intends to resume it.  Her weight decreased to 100 pounds during her illness but has since increased to 103-104 pounds. She has a history of shingles and is considering the shingles vaccine. She inquires about the safety of receiving the COVID and shingles vaccines on the same day.  She smokes occasionally, about five cigarettes per week, and is exposed to smoke from her husband's smoking and a  wood stove used for heating. She last smoked a cigarette at 4 AM on the day of the visit. No current issues with racing or abnormal heartbeats. Reports improvement in vision, noting that she can see the TV better without glasses.      Medications: Show/hide medication list[1]       Objective    BP 107/74 (BP Location: Left Arm, Patient Position: Sitting, Cuff Size: Normal)   Pulse 93   Temp 98.3 F (36.8 C) (Oral)   Ht 5' 4 (1.626 m)   Wt 103 lb 3.2 oz (46.8 kg)   SpO2 98%   BMI 17.71 kg/m     Physical Exam Constitutional:      Appearance: Normal appearance.  HENT:     Head: Normocephalic and atraumatic.  Eyes:     General: No scleral icterus.    Extraocular Movements: Extraocular movements intact.     Conjunctiva/sclera: Conjunctivae normal.  Cardiovascular:     Rate and Rhythm: Normal rate and regular rhythm.     Pulses: Normal pulses.     Heart sounds: Normal heart sounds.  Pulmonary:     Effort: Pulmonary effort is normal. No respiratory distress.     Breath sounds: Normal breath sounds.  Abdominal:     General: Bowel sounds are normal. There is no distension.     Palpations: Abdomen is soft. There is no mass.     Tenderness: There is no abdominal tenderness. There is no guarding.  Musculoskeletal:     Right  lower leg: No edema.     Left lower leg: No edema.  Skin:    General: Skin is warm and dry.  Neurological:     Mental Status: She is alert and oriented to person, place, and time. Mental status is at baseline.  Psychiatric:        Mood and Affect: Mood normal.        Behavior: Behavior normal.      Results for orders placed or performed in visit on 01/27/24  Comprehensive metabolic panel with GFR  Result Value Ref Range   Glucose 232 (H) 70 - 99 mg/dL   BUN 14 8 - 27 mg/dL   Creatinine, Ser 8.94 (H) 0.57 - 1.00 mg/dL   eGFR 61 >40 fO/fpw/8.26   BUN/Creatinine Ratio 13 12 - 28   Sodium 138 134 - 144 mmol/L   Potassium 4.5 3.5 - 5.2 mmol/L    Chloride 99 96 - 106 mmol/L   CO2 22 20 - 29 mmol/L   Calcium  10.1 8.7 - 10.3 mg/dL   Total Protein 6.2 6.0 - 8.5 g/dL   Albumin 4.3 3.8 - 4.9 g/dL   Globulin, Total 1.9 1.5 - 4.5 g/dL   Bilirubin Total 0.5 0.0 - 1.2 mg/dL   Alkaline Phosphatase 86 49 - 135 IU/L   AST 12 0 - 40 IU/L   ALT 19 0 - 32 IU/L  Hemoglobin A1c  Result Value Ref Range   Hgb A1c MFr Bld 10.2 (H) 4.8 - 5.6 %   Est. average glucose Bld gHb Est-mCnc 246 mg/dL  Lipid panel  Result Value Ref Range   Cholesterol, Total 143 100 - 199 mg/dL   Triglycerides 94 0 - 149 mg/dL   HDL 50 >60 mg/dL   VLDL Cholesterol Cal 18 5 - 40 mg/dL   LDL Chol Calc (NIH) 75 0 - 99 mg/dL   Chol/HDL Ratio 2.9 0.0 - 4.4 ratio  VITAMIN D  25 Hydroxy (Vit-D Deficiency, Fractures)  Result Value Ref Range   Vit D, 25-Hydroxy 31.4 30.0 - 100.0 ng/mL  TSH Rfx on Abnormal to Free T4  Result Value Ref Range   TSH 1.320 0.450 - 4.500 uIU/mL  Vitamin B12  Result Value Ref Range   Vitamin B-12 481 232 - 1,245 pg/mL    Assessment & Plan    Type 2 diabetes mellitus with hyperglycemia, without long-term current use of insulin  (HCC) -     Comprehensive metabolic panel with GFR -     Hemoglobin A1c -     Vitamin B12  Vitamin D  deficiency -     VITAMIN D  25 Hydroxy (Vit-D Deficiency, Fractures)  Need for pneumococcal vaccination -     Pneumococcal conjugate vaccine 20-valent  High risk medication use -     Vitamin B12  Hyperlipidemia associated with type 2 diabetes mellitus (HCC) Assessment & Plan: Chronic, managed with diet/exercise. Last LDL 66 without medication.  Orders: -     Lipid panel  Acquired hypothyroidism Assessment & Plan: Chronic, managed on levothyroxine  112 mcg daily. Recheck TSH today  Orders: -     TSH Rfx on Abnormal to Free T4  Lung cancer screening declined by patient    Type 2 diabetes mellitus with hyperglycemia, without long-term current use of insulin  A1c was eight three months ago. Blood sugar  monitoring interrupted. No hyperglycemia symptoms. - Refilled Januvia  prescription. - Checked A1c level. - Continue Jardiance . - Encouraged regular blood sugar monitoring.  Vitamin D  deficiency Ran out of vitamin  D during illness. - Recheck vitamin D  level today. - Refilled vitamin D  prescription.  General Health Maintenance Eye exam and mammogram overdue. Pneumonia vaccine given. Shingles vaccine postponed. - Schedule eye exam. - Schedule mammogram. - Administered pneumonia vaccine. - Postponed shingles vaccine. - Discussed COVID booster.    Return in about 3 months (around 04/26/2024) for DM and 07/22/23 or later for CPE w/next provider.      I discussed the assessment and treatment plan with the patient  The patient was provided an opportunity to ask questions and all were answered. The patient agreed with the plan and demonstrated an understanding of the instructions.   The patient was advised to call back or seek an in-person evaluation if the symptoms worsen or if the condition fails to improve as anticipated.    LAURAINE LOISE BUOY, DO  [facility] [phone] (phone) [phone] (fax)  [facility]     [1]  Outpatient Medications Prior to Visit  Medication Sig   albuterol  (VENTOLIN  HFA) 108 (90 Base) MCG/ACT inhaler Inhale 1-2 puffs into the lungs every 6 (six) hours as needed for wheezing or shortness of breath.   Blood Glucose Monitoring Suppl DEVI 1 each by Does not apply route daily before breakfast. May substitute to any manufacturer covered by patient's insurance.   budesonide -formoterol  (SYMBICORT ) 160-4.5 MCG/ACT inhaler Inhale 2 puffs into the lungs 2 (two) times daily.   Cholecalciferol 125 MCG (5000 UT) TABS Take 5,000 Units by mouth daily. (Patient not taking: Reported on 02/07/2024)   cloNIDine (CATAPRES) 0.1 MG tablet Take 1 tablet (0.1 mg total) by mouth 3 (three) times daily. In pain pump   diazepam (VALIUM) 5  MG tablet Take 5 mg by mouth.   [Paused] empagliflozin  (JARDIANCE ) 25 MG TABS tablet Take 1 tablet (25 mg total) by mouth daily.   Glucose Blood (BLOOD GLUCOSE TEST STRIPS) STRP 1 each by In Vitro route daily before breakfast. May substitute to any manufacturer covered by patient's insurance.   HYDROmorphone  in sodium chloride  0.9 % 100 mL Inject into the vein continuous. In pain pump   ibuprofen  (ADVIL ) 100 MG tablet 3 tablets 4 times a day by oral route.   Lancet Device MISC 1 each by Does not apply route daily before breakfast. May substitute to any manufacturer covered by patient's insurance.   levothyroxine  (SYNTHROID ) 112 MCG tablet Take 1 tablet (112 mcg total) by mouth daily.   [Paused] lisinopril  (ZESTRIL ) 2.5 MG tablet Take 1 tablet (2.5 mg total) by mouth daily.   metFORMIN  (GLUCOPHAGE -XR) 500 MG 24 hr tablet Take 2 tablets (1,000 mg total) by mouth 2 (two) times daily with a meal.   naloxone (NARCAN) nasal spray 4 mg/0.1 mL Place into the nose.   sitaGLIPtin  (JANUVIA ) 100 MG tablet Take 1 tablet (100 mg total) by mouth daily.   [DISCONTINUED] baclofen (LIORESAL) 0.05 MG/ML injection 1 mL (50 mcg total) by Intrathecal route once for 1 dose. In pain pump   [DISCONTINUED] azithromycin  (ZITHROMAX ) 250 MG tablet Take 1 tablet daily   No facility-administered medications prior to visit.   "

## 2024-01-27 NOTE — Assessment & Plan Note (Signed)
 Chronic, managed on levothyroxine  112 mcg daily. Recheck TSH today

## 2024-01-27 NOTE — Patient Instructions (Signed)
Schedule eye exam and mammogram 

## 2024-01-28 LAB — COMPREHENSIVE METABOLIC PANEL WITH GFR
ALT: 19 IU/L (ref 0–32)
AST: 12 IU/L (ref 0–40)
Albumin: 4.3 g/dL (ref 3.8–4.9)
Alkaline Phosphatase: 86 IU/L (ref 49–135)
BUN/Creatinine Ratio: 13 (ref 12–28)
BUN: 14 mg/dL (ref 8–27)
Bilirubin Total: 0.5 mg/dL (ref 0.0–1.2)
CO2: 22 mmol/L (ref 20–29)
Calcium: 10.1 mg/dL (ref 8.7–10.3)
Chloride: 99 mmol/L (ref 96–106)
Creatinine, Ser: 1.05 mg/dL — ABNORMAL HIGH (ref 0.57–1.00)
Globulin, Total: 1.9 g/dL (ref 1.5–4.5)
Glucose: 232 mg/dL — ABNORMAL HIGH (ref 70–99)
Potassium: 4.5 mmol/L (ref 3.5–5.2)
Sodium: 138 mmol/L (ref 134–144)
Total Protein: 6.2 g/dL (ref 6.0–8.5)
eGFR: 61 mL/min/1.73 (ref >59)

## 2024-01-28 LAB — LIPID PANEL
Chol/HDL Ratio: 2.9 ratio (ref 0.0–4.4)
Cholesterol, Total: 143 mg/dL (ref 100–199)
HDL: 50 mg/dL (ref >39)
LDL Chol Calc (NIH): 75 mg/dL (ref 0–99)
Triglycerides: 94 mg/dL (ref 0–149)
VLDL Cholesterol Cal: 18 mg/dL (ref 5–40)

## 2024-01-28 LAB — HEMOGLOBIN A1C
Est. average glucose Bld gHb Est-mCnc: 246 mg/dL
Hgb A1c MFr Bld: 10.2 % — ABNORMAL HIGH (ref 4.8–5.6)

## 2024-01-28 LAB — TSH RFX ON ABNORMAL TO FREE T4: TSH: 1.32 u[IU]/mL (ref 0.450–4.500)

## 2024-01-28 LAB — VITAMIN D 25 HYDROXY (VIT D DEFICIENCY, FRACTURES): Vit D, 25-Hydroxy: 31.4 ng/mL (ref 30.0–100.0)

## 2024-01-28 LAB — VITAMIN B12: Vitamin B-12: 481 pg/mL (ref 232–1245)

## 2024-02-06 ENCOUNTER — Encounter: Payer: Self-pay | Admitting: Family Medicine

## 2024-02-07 ENCOUNTER — Inpatient Hospital Stay
Admission: EM | Admit: 2024-02-07 | Discharge: 2024-02-09 | DRG: 871 | Disposition: A | Discharge status: Home / Self Care

## 2024-02-07 ENCOUNTER — Emergency Department

## 2024-02-07 ENCOUNTER — Other Ambulatory Visit: Payer: Self-pay

## 2024-02-07 DIAGNOSIS — Z681 Body mass index (BMI) 19 or less, adult: Secondary | ICD-10-CM

## 2024-02-07 DIAGNOSIS — E860 Dehydration: Secondary | ICD-10-CM | POA: Diagnosis present

## 2024-02-07 DIAGNOSIS — F17210 Nicotine dependence, cigarettes, uncomplicated: Secondary | ICD-10-CM | POA: Diagnosis present

## 2024-02-07 DIAGNOSIS — J449 Chronic obstructive pulmonary disease, unspecified: Secondary | ICD-10-CM

## 2024-02-07 DIAGNOSIS — Z7951 Long term (current) use of inhaled steroids: Secondary | ICD-10-CM

## 2024-02-07 DIAGNOSIS — J441 Chronic obstructive pulmonary disease with (acute) exacerbation: Secondary | ICD-10-CM | POA: Diagnosis present

## 2024-02-07 DIAGNOSIS — E785 Hyperlipidemia, unspecified: Secondary | ICD-10-CM | POA: Diagnosis present

## 2024-02-07 DIAGNOSIS — R918 Other nonspecific abnormal finding of lung field: Secondary | ICD-10-CM | POA: Diagnosis not present

## 2024-02-07 DIAGNOSIS — A419 Sepsis, unspecified organism: Secondary | ICD-10-CM | POA: Diagnosis present

## 2024-02-07 DIAGNOSIS — Z1152 Encounter for screening for COVID-19: Secondary | ICD-10-CM

## 2024-02-07 DIAGNOSIS — N179 Acute kidney failure, unspecified: Secondary | ICD-10-CM | POA: Diagnosis present

## 2024-02-07 DIAGNOSIS — R636 Underweight: Secondary | ICD-10-CM | POA: Diagnosis present

## 2024-02-07 DIAGNOSIS — Z79890 Hormone replacement therapy (postmenopausal): Secondary | ICD-10-CM | POA: Diagnosis not present

## 2024-02-07 DIAGNOSIS — Z833 Family history of diabetes mellitus: Secondary | ICD-10-CM

## 2024-02-07 DIAGNOSIS — G8929 Other chronic pain: Secondary | ICD-10-CM | POA: Diagnosis present

## 2024-02-07 DIAGNOSIS — Z96641 Presence of right artificial hip joint: Secondary | ICD-10-CM | POA: Diagnosis present

## 2024-02-07 DIAGNOSIS — D751 Secondary polycythemia: Secondary | ICD-10-CM | POA: Diagnosis present

## 2024-02-07 DIAGNOSIS — Z90710 Acquired absence of both cervix and uterus: Secondary | ICD-10-CM | POA: Diagnosis not present

## 2024-02-07 DIAGNOSIS — R000 Tachycardia, unspecified: Secondary | ICD-10-CM | POA: Diagnosis not present

## 2024-02-07 DIAGNOSIS — Z79899 Other long term (current) drug therapy: Secondary | ICD-10-CM | POA: Diagnosis not present

## 2024-02-07 DIAGNOSIS — E039 Hypothyroidism, unspecified: Secondary | ICD-10-CM | POA: Diagnosis present

## 2024-02-07 DIAGNOSIS — Z794 Long term (current) use of insulin: Secondary | ICD-10-CM | POA: Diagnosis not present

## 2024-02-07 DIAGNOSIS — E1110 Type 2 diabetes mellitus with ketoacidosis without coma: Secondary | ICD-10-CM | POA: Diagnosis not present

## 2024-02-07 DIAGNOSIS — Z7984 Long term (current) use of oral hypoglycemic drugs: Secondary | ICD-10-CM | POA: Diagnosis not present

## 2024-02-07 DIAGNOSIS — R0602 Shortness of breath: Secondary | ICD-10-CM | POA: Diagnosis not present

## 2024-02-07 DIAGNOSIS — E0910 Drug or chemical induced diabetes mellitus with ketoacidosis without coma: Secondary | ICD-10-CM | POA: Diagnosis present

## 2024-02-07 DIAGNOSIS — R634 Abnormal weight loss: Secondary | ICD-10-CM | POA: Diagnosis not present

## 2024-02-07 DIAGNOSIS — I152 Hypertension secondary to endocrine disorders: Secondary | ICD-10-CM | POA: Diagnosis present

## 2024-02-07 DIAGNOSIS — I509 Heart failure, unspecified: Secondary | ICD-10-CM | POA: Diagnosis not present

## 2024-02-07 DIAGNOSIS — Z8719 Personal history of other diseases of the digestive system: Secondary | ICD-10-CM | POA: Diagnosis not present

## 2024-02-07 DIAGNOSIS — K1370 Unspecified lesions of oral mucosa: Secondary | ICD-10-CM

## 2024-02-07 LAB — RESP PANEL BY RT-PCR (RSV, FLU A&B, COVID)  RVPGX2
Influenza A by PCR: NEGATIVE
Influenza B by PCR: NEGATIVE
Resp Syncytial Virus by PCR: NEGATIVE
SARS Coronavirus 2 by RT PCR: NEGATIVE

## 2024-02-07 LAB — BASIC METABOLIC PANEL WITH GFR
Anion gap: 23 — ABNORMAL HIGH (ref 5–15)
Anion gap: 21 — ABNORMAL HIGH (ref 5–15)
Anion gap: 16 — ABNORMAL HIGH (ref 5–15)
BUN: 47 mg/dL — ABNORMAL HIGH (ref 6–20)
BUN: 44 mg/dL — ABNORMAL HIGH (ref 6–20)
BUN: 39 mg/dL — ABNORMAL HIGH (ref 6–20)
CO2: 17 mmol/L — ABNORMAL LOW (ref 22–32)
CO2: 17 mmol/L — ABNORMAL LOW (ref 22–32)
CO2: 21 mmol/L — ABNORMAL LOW (ref 22–32)
Calcium: 9.6 mg/dL (ref 8.9–10.3)
Calcium: 9 mg/dL (ref 8.9–10.3)
Calcium: 9.4 mg/dL (ref 8.9–10.3)
Chloride: 82 mmol/L — ABNORMAL LOW (ref 98–111)
Chloride: 92 mmol/L — ABNORMAL LOW (ref 98–111)
Chloride: 99 mmol/L (ref 98–111)
Creatinine, Ser: 1.87 mg/dL — ABNORMAL HIGH (ref 0.44–1.00)
Creatinine, Ser: 1.6 mg/dL — ABNORMAL HIGH (ref 0.44–1.00)
Creatinine, Ser: 1.41 mg/dL — ABNORMAL HIGH (ref 0.44–1.00)
GFR, Estimated: 30 mL/min — ABNORMAL LOW
GFR, Estimated: 37 mL/min — ABNORMAL LOW
GFR, Estimated: 42 mL/min — ABNORMAL LOW
Glucose, Bld: 1168 mg/dL (ref 70–99)
Glucose, Bld: 813 mg/dL (ref 70–99)
Glucose, Bld: 358 mg/dL — ABNORMAL HIGH (ref 70–99)
Potassium: 5.3 mmol/L — ABNORMAL HIGH (ref 3.5–5.1)
Potassium: 5.1 mmol/L (ref 3.5–5.1)
Potassium: 4 mmol/L (ref 3.5–5.1)
Sodium: 121 mmol/L — ABNORMAL LOW (ref 135–145)
Sodium: 129 mmol/L — ABNORMAL LOW (ref 135–145)
Sodium: 135 mmol/L (ref 135–145)

## 2024-02-07 LAB — GLUCOSE, CAPILLARY
Glucose-Capillary: >600 mg/dL (ref 70–99)
Glucose-Capillary: >600 mg/dL (ref 70–99)
Glucose-Capillary: >600 mg/dL (ref 70–99)
Glucose-Capillary: >600 mg/dL (ref 70–99)
Glucose-Capillary: 474 mg/dL — ABNORMAL HIGH (ref 70–99)
Glucose-Capillary: 591 mg/dL (ref 70–99)
Glucose-Capillary: 482 mg/dL — ABNORMAL HIGH (ref 70–99)
Glucose-Capillary: 252 mg/dL — ABNORMAL HIGH (ref 70–99)
Glucose-Capillary: 198 mg/dL — ABNORMAL HIGH (ref 70–99)
Glucose-Capillary: 169 mg/dL — ABNORMAL HIGH (ref 70–99)

## 2024-02-07 LAB — MAGNESIUM: Magnesium: 3 mg/dL — ABNORMAL HIGH (ref 1.7–2.4)

## 2024-02-07 LAB — LACTIC ACID, PLASMA
Lactic Acid, Venous: 3.4 mmol/L (ref 0.5–1.9)
Lactic Acid, Venous: 3.3 mmol/L (ref 0.5–1.9)
Lactic Acid, Venous: 3.3 mmol/L (ref 0.5–1.9)

## 2024-02-07 LAB — CBC
HCT: 52.7 % — ABNORMAL HIGH (ref 36.0–46.0)
Hemoglobin: 17.5 g/dL — ABNORMAL HIGH (ref 12.0–15.0)
MCH: 30.5 pg (ref 26.0–34.0)
MCHC: 33.2 g/dL (ref 30.0–36.0)
MCV: 91.8 fL (ref 80.0–100.0)
Platelets: 299 K/uL (ref 150–400)
RBC: 5.74 MIL/uL — ABNORMAL HIGH (ref 3.87–5.11)
RDW: 13 % (ref 11.5–15.5)
WBC: 17 K/uL — ABNORMAL HIGH (ref 4.0–10.5)
nRBC: 0 % (ref 0.0–0.2)

## 2024-02-07 LAB — PHOSPHORUS: Phosphorus: 5.5 mg/dL — ABNORMAL HIGH (ref 2.5–4.6)

## 2024-02-07 LAB — PROCALCITONIN: Procalcitonin: 1.02 ng/mL

## 2024-02-07 LAB — BLOOD GAS, VENOUS
Acid-base deficit: 8.4 mmol/L — ABNORMAL HIGH (ref 0.0–2.0)
Bicarbonate: 19.9 mmol/L — ABNORMAL LOW (ref 20.0–28.0)
O2 Saturation: 41 %
Patient temperature: 37
pCO2, Ven: 51 mmHg (ref 44–60)
pH, Ven: 7.2 — ABNORMAL LOW (ref 7.25–7.43)

## 2024-02-07 LAB — RAPID HIV SCREEN (HIV 1/2 AB+AG)
HIV 1/2 Antibodies: NONREACTIVE
HIV-1 P24 Antigen - HIV24: NONREACTIVE

## 2024-02-07 LAB — BETA-HYDROXYBUTYRIC ACID
Beta-Hydroxybutyric Acid: 3.92 mmol/L — ABNORMAL HIGH (ref 0.05–0.27)
Beta-Hydroxybutyric Acid: 3.78 mmol/L — ABNORMAL HIGH (ref 0.05–0.27)
Beta-Hydroxybutyric Acid: 0.59 mmol/L — ABNORMAL HIGH (ref 0.05–0.27)

## 2024-02-07 LAB — MRSA NEXT GEN BY PCR, NASAL: MRSA by PCR Next Gen: NOT DETECTED

## 2024-02-07 LAB — PRO BRAIN NATRIURETIC PEPTIDE: Pro Brain Natriuretic Peptide: 1402 pg/mL — ABNORMAL HIGH

## 2024-02-07 MED ORDER — DOXYCYCLINE HYCLATE 100 MG PO TABS
100.0000 mg | ORAL_TABLET | Freq: Two times a day (BID) | ORAL | Status: DC
  Administered 2024-02-07 – 2024-02-09 (×4): 100 mg via ORAL
  Filled 2024-02-07: qty 1

## 2024-02-07 MED ORDER — SODIUM CHLORIDE 0.9 % IV SOLN
2.0000 g | INTRAVENOUS | Status: DC
  Administered 2024-02-07: 2 g via INTRAVENOUS
  Filled 2024-02-07: qty 12.5

## 2024-02-07 MED ORDER — SODIUM CHLORIDE 0.9 % IV BOLUS
1000.0000 mL | Freq: Once | INTRAVENOUS | Status: AC
  Administered 2024-02-07: 1000 mL via INTRAVENOUS

## 2024-02-07 MED ORDER — INSULIN REGULAR(HUMAN) IN NACL 100-0.9 UT/100ML-% IV SOLN
INTRAVENOUS | Status: DC
  Administered 2024-02-07: 6.5 [IU]/h via INTRAVENOUS
  Filled 2024-02-07: qty 100

## 2024-02-07 MED ORDER — ENOXAPARIN SODIUM 30 MG/0.3ML IJ SOSY
30.0000 mg | PREFILLED_SYRINGE | INTRAMUSCULAR | Status: DC
  Administered 2024-02-07 – 2024-02-08 (×2): 30 mg via SUBCUTANEOUS
  Filled 2024-02-07: qty 0.3

## 2024-02-07 MED ORDER — NICOTINE POLACRILEX 2 MG MT GUM: 2.0000 mg | CHEWING_GUM | OROMUCOSAL | Status: DC | PRN

## 2024-02-07 MED ORDER — LEVOTHYROXINE SODIUM 112 MCG PO TABS
112.0000 ug | ORAL_TABLET | Freq: Every day | ORAL | Status: DC
  Administered 2024-02-08 – 2024-02-09 (×2): 112 ug via ORAL
  Filled 2024-02-07: qty 1

## 2024-02-07 MED ORDER — PREDNISONE 20 MG PO TABS
40.0000 mg | ORAL_TABLET | Freq: Every day | ORAL | Status: DC
  Administered 2024-02-08 – 2024-02-09 (×2): 40 mg via ORAL

## 2024-02-07 MED ORDER — IPRATROPIUM-ALBUTEROL 0.5-2.5 (3) MG/3ML IN SOLN
3.0000 mL | Freq: Once | RESPIRATORY_TRACT | Status: AC
  Administered 2024-02-07: 3 mL via RESPIRATORY_TRACT
  Filled 2024-02-07: qty 3

## 2024-02-07 MED ORDER — LACTATED RINGERS IV BOLUS
20.0000 mL/kg | Freq: Once | INTRAVENOUS | Status: AC
  Administered 2024-02-07: 936 mL via INTRAVENOUS

## 2024-02-07 MED ORDER — DEXTROSE 50 % IV SOLN: 0.0000 mL | INTRAVENOUS | Status: DC | PRN

## 2024-02-07 MED ORDER — ALBUTEROL SULFATE HFA 108 (90 BASE) MCG/ACT IN AERS: 1.0000 | INHALATION_SPRAY | Freq: Four times a day (QID) | RESPIRATORY_TRACT | Status: DC | PRN

## 2024-02-07 MED ORDER — DEXTROSE IN LACTATED RINGERS 5 % IV SOLN: INTRAVENOUS | Status: DC

## 2024-02-07 MED ORDER — ALBUTEROL SULFATE (2.5 MG/3ML) 0.083% IN NEBU: 2.5000 mg | INHALATION_SOLUTION | Freq: Four times a day (QID) | RESPIRATORY_TRACT | Status: DC | PRN

## 2024-02-07 MED ORDER — SODIUM CHLORIDE 0.9 % IV SOLN
500.0000 mg | Freq: Once | INTRAVENOUS | Status: AC
  Administered 2024-02-07: 500 mg via INTRAVENOUS
  Filled 2024-02-07: qty 5

## 2024-02-07 MED ORDER — CHLORHEXIDINE GLUCONATE CLOTH 2 % EX PADS
6.0000 | MEDICATED_PAD | Freq: Every day | CUTANEOUS | Status: DC
  Administered 2024-02-07: 6 via TOPICAL

## 2024-02-07 MED ORDER — SODIUM CHLORIDE 0.9 % IV SOLN
1.0000 g | Freq: Once | INTRAVENOUS | Status: AC
  Administered 2024-02-07: 1 g via INTRAVENOUS
  Filled 2024-02-07: qty 10

## 2024-02-07 MED ORDER — FLUTICASONE FUROATE-VILANTEROL 200-25 MCG/ACT IN AEPB
1.0000 | INHALATION_SPRAY | Freq: Every day | RESPIRATORY_TRACT | Status: DC
  Administered 2024-02-07 – 2024-02-09 (×3): 1 via RESPIRATORY_TRACT
  Filled 2024-02-07: qty 28

## 2024-02-07 MED ORDER — ENOXAPARIN SODIUM 40 MG/0.4ML IJ SOSY: 40.0000 mg | PREFILLED_SYRINGE | INTRAMUSCULAR | Status: DC

## 2024-02-07 MED ORDER — LACTATED RINGERS IV SOLN: INTRAVENOUS | Status: DC

## 2024-02-07 MED ORDER — SODIUM CHLORIDE 0.9 % IV SOLN: INTRAVENOUS | Status: DC | PRN

## 2024-02-07 MED ORDER — FLUCONAZOLE 100 MG PO TABS
100.0000 mg | ORAL_TABLET | Freq: Every day | ORAL | Status: DC
  Administered 2024-02-07 – 2024-02-09 (×3): 100 mg via ORAL
  Filled 2024-02-07: qty 1

## 2024-02-07 MED ORDER — IPRATROPIUM-ALBUTEROL 0.5-2.5 (3) MG/3ML IN SOLN
3.0000 mL | Freq: Four times a day (QID) | RESPIRATORY_TRACT | Status: DC | PRN
  Administered 2024-02-07: 3 mL via RESPIRATORY_TRACT
  Filled 2024-02-07: qty 3

## 2024-02-07 NOTE — H&P (Addendum)
 " History and Physical    [name] FMW:[id number] DOB: 1965-07-28 DOA: 02/07/2024 DOS: the patient was seen and examined on 02/07/2024 PCP: Donzella Lauraine SAILOR, DO  Patient coming from: Home  Chief Complaint: Generalized weakness Chief Complaint  Patient presents with   Shortness of Breath   HPI: [name] is a 58 y.o. female with medical history significant of type II DM, hypothyroidism, HLD, HTN, tobacco use, presumed COPD presented to ED due to several weeks of worsening SOB, weight loss, generalized weakness. Patient has used report using multiple courses of prednisone  and breathing treatments but continues to feel weak.  Reports mild nausea, no episodes of emesis.  Denies chest pain, SOB, vomiting, abdominal pain, dysuria.  Also reports symptoms have started since she has taken her pneumococcal vaccine this month.  Compliant with medication.  Also reports she has noticed white patches in her mouth since 4 to 5 days. Lives with husband at home Patient was found to have DKA, AKI, mild COPD exacerbation and clinical picture suspicious for infectious etiology, workup pending.  ED workup: Afebrile, tachycardic with heart rate in low 100s, BP stable with saturating 100% on room air Workup revealed BMP with corrected sodium 138, potassium 5.3, bicarb 17, AG 23.  BUN/creatinine 47/1.87 with GFR 30 VBG shows pH 7.2, pCO2 51.  CBC with leukocytosis 17.0, Hb 17.5 BNP elevated, lactic acid 3.4.  Procalcitonin elevated 1.02, COVID/flu/RSV negative.  CXR negative Pending UA, blood culture   In ED started on IV insulin  drip, received 2 L LR bolus, on IV fluids.  1 dose of IV ceftriaxone , azithromycin    Review of Systems: As mentioned in the history of present illness. All other systems reviewed and are negative. Past Medical History:  Diagnosis Date   Allergy    Anxiety    Arthritis    Chronic pain    COPD (chronic obstructive pulmonary disease) (HCC)    Diabetes mellitus  without complication (HCC)    Dyspnea    Family history of diabetes mellitus in father 04/12/2021   GERD (gastroesophageal reflux disease)    Hypertension    Hypertension associated with diabetes (HCC) 10/17/2014   Hypothyroidism    Neuromuscular disorder (HCC)    Pneumonia    Thyroid  disease    Past Surgical History:  Procedure Laterality Date   ABDOMINAL HYSTERECTOMY  02/11/1999   CESAREAN SECTION  02/11/1992   COLONOSCOPY N/A 10/01/2023   Procedure: COLONOSCOPY;  Surgeon: Jinny Carmine, MD;  Location: [facility] ENDOSCOPY;  Service: Endoscopy;  Laterality: N/A;   JOINT REPLACEMENT Right 2005, 2010   hip   PAIN PUMP IMPLANTATION  2008, 2014   Dr Nick   POLYPECTOMY  10/01/2023   Procedure: POLYPECTOMY, INTESTINE;  Surgeon: Jinny Carmine, MD;  Location: [facility] ENDOSCOPY;  Service: Endoscopy;;   ROOT CANAL Left    04/2021   TONSILLECTOMY  02/11/1968   TOTAL HIP REVISION Right 04/05/2020   Procedure: Revision Right total hip arthroplasty conversion of metal to ceramic with debridement;  Surgeon: Ernie Cough, MD;  Location: [facility] ORS;  Service: Orthopedics;  Laterality: Right;  90 mins   Social History:  reports that she has been smoking cigarettes. She started smoking about 3 years ago. She has a 40.9 pack-year smoking history. She has never used smokeless tobacco. She reports that she does not drink alcohol and does not use drugs.  Allergies[1]  Family History  Problem Relation Age of Onset   ADD / ADHD Mother    Arthritis Mother  Diabetes Father    ADD / ADHD Daughter    Breast cancer Neg Hx     Prior to Admission medications  Medication Sig Start Date End Date Taking? Authorizing Provider  albuterol  (VENTOLIN  HFA) 108 (90 Base) MCG/ACT inhaler Inhale 1-2 puffs into the lungs every 6 (six) hours as needed for wheezing or shortness of breath. 11/11/23   Donzella Lauraine SAILOR, DO  baclofen (LIORESAL) 0.05 MG/ML injection 1 mL (50 mcg total) by Intrathecal route once for 1 dose. In pain pump  03/16/20   Vivienne Nest M, PA-C  Blood Glucose Monitoring Suppl DEVI 1 each by Does not apply route daily before breakfast. May substitute to any manufacturer covered by patient's insurance. 07/21/23   Donzella Lauraine SAILOR, DO  budesonide -formoterol  (SYMBICORT ) 160-4.5 MCG/ACT inhaler Inhale 2 puffs into the lungs 2 (two) times daily. 10/21/23   Donzella Lauraine SAILOR, DO  Cholecalciferol 125 MCG (5000 UT) TABS Take 5,000 Units by mouth daily.    [provider]  cloNIDine (CATAPRES) 0.1 MG tablet Take 1 tablet (0.1 mg total) by mouth 3 (three) times daily. In pain pump 03/16/20   Vivienne Nest M, PA-C  diazepam (VALIUM) 5 MG tablet Take 5 mg by mouth. 05/26/23   [provider]  empagliflozin  (JARDIANCE ) 25 MG TABS tablet Take 1 tablet (25 mg total) by mouth daily. 07/21/23   Donzella Lauraine SAILOR, DO  Glucose Blood (BLOOD GLUCOSE TEST STRIPS) STRP 1 each by In Vitro route daily before breakfast. May substitute to any manufacturer covered by patient's insurance. 07/21/23   Donzella Lauraine SAILOR, DO  HYDROmorphone  in sodium chloride  0.9 % 100 mL Inject into the vein continuous. In pain pump 03/16/20   Vivienne Nest M, PA-C  ibuprofen  (ADVIL ) 100 MG tablet 3 tablets 4 times a day by oral route.    [provider]  Lancet Device MISC 1 each by Does not apply route daily before breakfast. May substitute to any manufacturer covered by patient's insurance. 07/21/23   Donzella Lauraine SAILOR, DO  levothyroxine  (SYNTHROID ) 112 MCG tablet Take 1 tablet (112 mcg total) by mouth daily. 07/21/23   Donzella Lauraine SAILOR, DO  lisinopril  (ZESTRIL ) 2.5 MG tablet Take 1 tablet (2.5 mg total) by mouth daily. 10/21/23   Donzella Lauraine SAILOR, DO  metFORMIN  (GLUCOPHAGE -XR) 500 MG 24 hr tablet Take 2 tablets (1,000 mg total) by mouth 2 (two) times daily with a meal. 07/21/23   Pardue, Lauraine SAILOR, DO  naloxone [facility]) nasal spray 4 mg/0.1 mL Place into the nose. 02/19/21   [provider]  sitaGLIPtin  (JANUVIA ) 100 MG tablet Take 1  tablet (100 mg total) by mouth daily. 10/21/23   Donzella Lauraine SAILOR, DO    Physical Exam: Vitals:   02/07/24 1515 02/07/24 1530 02/07/24 1545 02/07/24 1600  BP:  (!) 105/55  92/62  Pulse: 94 96 97 (!) 103  Resp: 12 14 12 12   Temp:      TempSrc:      SpO2: 92% 90% 91% (!) 89%  Weight:       General: Patient is very thin, appears ill Patient has white lesions that are not vesicular on insides of cheek but can be consistent with Candida versus leukoplakia Neck: Normal range of motion Respiratory: Mild wheezing bilaterally Cardiovascular: Patient is tachycardic, RR without murmur appreciated GI: Abd SNT with no guarding or rebound  Back: Normal inspection of the back with good strength and range of motion throughout all ext Extremities: pulses intact with good cap  refills, no LE pitting edema or calf tenderness Neuro: AO x 3, no gross focal deficits Skin: Warm, dry, and intact Psych: normal mood and affect, no SI or HI   Data Reviewed: Labs and Imaging reviewed  Assessment and Plan: DKA DM type II Suspect due to underlying infectious etiology  HbA1c 10.2 Home regimen: Jardiance , metformin  1000 mg bid, Januvia  100 mg daily BMP with bicarb 17, blood glucose > 1000, AG 23, pH 7.2, Beta-hydroxybutyrate elevated  Started on insulin  infusion IV fluids Monitor CBG q1, BMP, Mag, phos Q4 Admit to stepdown Diabetes coordinator consult  AKI Likely due to dehydration, Cr creatinine 1.41 Continue IV fluids, monitor creatinine  Suspect sepsis, unknown source Tachycardic, leukocytosis meets SIRS criteria, unknown source Lactic acidosis 3.4, Procalcitonin elevated 1.02 COVID/flu/RSV negative.  CXR negative. Pending UA, RVP Continue IV cefepime  Follow-up blood cultures Trend lactic acid  White patches in mouth Suspect oral candidiasis vs leukoplakia Reports using multiple courses of oral and inhaled steroids We will treat with p.o. fluconazole  200mg  daily If no improvement, patient  will need biopsy (high risk due to tobacco use)  Pseudohyponatremia Corrected sodium 138 Monitor sodium  Mild acute COPD exacerbation Tobacco use Prednisone  40 mg daily, doxycycline   continue home inhalers, DuoNebs as needed Smoking cessation counseled Nicotine  gums as needed  HTN Hold Lisinopril  due to AKI and soft BP  Acquired polycythemia Monitor Hb  Hypothyroidism Continue levothyroxine   Chronic pain Uses Dilaudid , clonidine, Valium, baclofen for pain pump  -- PT/OT eval RD consult   Advance Care Planning:   Code Status: Full Code   Consults: None  Family Communication: None, at the bedside  Severity of Illness: The appropriate patient status for this patient is INPATIENT. Inpatient status is judged to be reasonable and necessary in order to provide the required intensity of service to ensure the patient's safety. The patient's presenting symptoms, physical exam findings, and initial radiographic and laboratory data in the context of their chronic comorbidities is felt to place them at high risk for further clinical deterioration. Furthermore, it is not anticipated that the patient will be medically stable for discharge from the hospital within 2 midnights of admission.   * I certify that at the point of admission it is my clinical judgment that the patient will require inpatient hospital care spanning beyond 2 midnights from the point of admission due to high intensity of service, high risk for further deterioration and high frequency of surveillance required.*  Author: Laree Lock, MD 02/07/2024 4:09 PM  For on call review [email or web address].      [1]  Allergies Allergen Reactions   Penicillins Rash and Dermatitis    Tolerated Cephalosporin Date: 04/05/20.   Morphine Itching and Nausea And Vomiting    morphine   "

## 2024-02-07 NOTE — ED Provider Notes (Signed)
 "  [facility] Provider Note    Event Date/Time   First MD Initiated Contact with Patient 02/07/24 [phone]     (approximate)   History   Shortness of Breath   HPI  Tasha Nichols is a 58 y.o. female, type 2 diabetes, hypothyroidism, hyperlipidemia, previous smoking and presumed COPD who presents to the emergency department with several weeks of progressively worsening shortness of breath, weight loss, generalized weakness.  Patient reports that she has been on multiple courses of prednisone  with the last 1 ending earlier this past week and multiple new breathing treatments.  She reports some new lesions on her bilateral inner mucosa of her cheeks.  Denies any chest pain abdominal pain.  Has slight nausea but no vomiting.  She presents with her husband who contributes to history     Physical Exam   Triage Vital Signs: ED Triage Vitals [02/07/24 0927]  Encounter Vitals Group     BP 102/84     Girls Systolic BP Percentile      Girls Diastolic BP Percentile      Boys Systolic BP Percentile      Boys Diastolic BP Percentile      Pulse Rate (!) 126     Resp 20     Temp 98.5 F (36.9 C)     Temp Source Oral     SpO2 98 %     Weight      Height      Head Circumference      Peak Flow      Pain Score      Pain Loc      Pain Education      Exclude from Growth Chart     Most recent vital signs: Vitals:   02/07/24 1600 02/07/24 1612  BP: 92/62   Pulse: (!) 103 99  Resp: 12 13  Temp:    SpO2: (!) 89% 90%    Nursing Triage Note reviewed. Vital signs reviewed and patients oxygen saturation is normoxic  General: Patient is very thin, appears ill Head: Normocephalic and atraumatic Eyes: Normal inspection, extraocular muscles intact, no conjunctival pallor Ear, nose, throat: Normal external exam Patient has white lesions that are not vesicular on insides of cheek but can be consistent with Candida versus leukoplakia Neck: Normal range of  motion Respiratory: Patient is in moderate respiratory distress, lungs some wheezing Cardiovascular: Patient is tachycardic, RR without murmur appreciated GI: Abd SNT with no guarding or rebound  Back: Normal inspection of the back with good strength and range of motion throughout all ext Extremities: pulses intact with good cap refills, no LE pitting edema or calf tenderness Neuro: The patient is alert and oriented to person, place, and time, appropriately conversive, with 5/5 bilat UE/LE strength, no gross motor or sensory defects noted. Coordination appears to be adequate. Skin: Warm, dry, and intact Psych: normal mood and affect, no SI or HI  ED Results / Procedures / Treatments   Labs (all labs ordered are listed, but only abnormal results are displayed) Labs Reviewed  BASIC METABOLIC PANEL WITH GFR - Abnormal; Notable for the following components:      Result Value   Sodium 121 (*)    Potassium 5.3 (*)    Chloride 82 (*)    CO2 17 (*)    Glucose, Bld 1,168 (*)    BUN 47 (*)    Creatinine, Ser 1.87 (*)    GFR, Estimated 30 (*)    Anion  gap 23 (*)    All other components within normal limits  CBC - Abnormal; Notable for the following components:   WBC 17.0 (*)    RBC 5.74 (*)    Hemoglobin 17.5 (*)    HCT 52.7 (*)    All other components within normal limits  PRO BRAIN NATRIURETIC PEPTIDE - Abnormal; Notable for the following components:   Pro Brain Natriuretic Peptide 1,402.0 (*)    All other components within normal limits  BLOOD GAS, VENOUS - Abnormal; Notable for the following components:   pH, Ven 7.2 (*)    Bicarbonate 19.9 (*)    Acid-base deficit 8.4 (*)    All other components within normal limits  LACTIC ACID, PLASMA - Abnormal; Notable for the following components:   Lactic Acid, Venous 3.4 (*)    All other components within normal limits  BETA-HYDROXYBUTYRIC ACID - Abnormal; Notable for the following components:   Beta-Hydroxybutyric Acid 3.92 (*)    All  other components within normal limits  BASIC METABOLIC PANEL WITH GFR - Abnormal; Notable for the following components:   Sodium 129 (*)    Chloride 92 (*)    CO2 17 (*)    Glucose, Bld 813 (*)    BUN 44 (*)    Creatinine, Ser 1.60 (*)    GFR, Estimated 37 (*)    Anion gap 21 (*)    All other components within normal limits  BETA-HYDROXYBUTYRIC ACID - Abnormal; Notable for the following components:   Beta-Hydroxybutyric Acid 3.78 (*)    All other components within normal limits  MAGNESIUM  - Abnormal; Notable for the following components:   Magnesium  3.0 (*)    All other components within normal limits  PHOSPHORUS - Abnormal; Notable for the following components:   Phosphorus 5.5 (*)    All other components within normal limits  GLUCOSE, CAPILLARY - Abnormal; Notable for the following components:   Glucose-Capillary >600 (*)    All other components within normal limits  GLUCOSE, CAPILLARY - Abnormal; Notable for the following components:   Glucose-Capillary >600 (*)    All other components within normal limits  GLUCOSE, CAPILLARY - Abnormal; Notable for the following components:   Glucose-Capillary >600 (*)    All other components within normal limits  GLUCOSE, CAPILLARY - Abnormal; Notable for the following components:   Glucose-Capillary >600 (*)    All other components within normal limits  RESP PANEL BY RT-PCR (RSV, FLU A&B, COVID)  RVPGX2  MRSA NEXT GEN BY PCR, NASAL  CULTURE, BLOOD (ROUTINE X 2)  CULTURE, BLOOD (ROUTINE X 2)  RESPIRATORY PANEL BY PCR  PROCALCITONIN  RAPID HIV SCREEN (HIV 1/2 AB+AG)  URINALYSIS, COMPLETE (UACMP) WITH MICROSCOPIC  HEMOGLOBIN A1C  BASIC METABOLIC PANEL WITH GFR  BASIC METABOLIC PANEL WITH GFR  BETA-HYDROXYBUTYRIC ACID  BETA-HYDROXYBUTYRIC ACID  BASIC METABOLIC PANEL WITH GFR  BETA-HYDROXYBUTYRIC ACID  LACTIC ACID, PLASMA  LACTIC ACID, PLASMA     EKG EKG and rhythm strip are interpreted by myself:   EKG: tachycardic sinus  rhythm] at heart rate of 104, normal QRS duration, QTc 541, nonspecific ST segments and T waves no ectopy EKG not consistent with Acute STEMI Rhythm strip: tachycardic sinus rhytm in lead II   RADIOLOGY Xray chest: Appears consistent with emphysema on my independent review interpretation radiologist read is pending    PROCEDURES:  Critical Care performed: Yes, see critical care procedure note(s)  .Critical Care  Performed by: Nicholaus Rolland BRAVO, MD Authorized by: Nicholaus Rolland  E, MD   Critical care provider statement:    Critical care time (minutes):  35   Critical care was necessary to treat or prevent imminent or life-threatening deterioration of the following conditions:  Endocrine crisis   Critical care was time spent personally by me on the following activities:  Development of treatment plan with patient or surrogate, discussions with consultants, evaluation of patient's response to treatment, examination of patient, ordering and review of laboratory studies, ordering and review of radiographic studies, ordering and performing treatments and interventions, pulse oximetry, re-evaluation of patient's condition and review of old charts   Care discussed with: admitting provider   Comments:     DKA and needing administration of insulin  drip    MEDICATIONS ORDERED IN ED: Medications  insulin  regular, human (MYXREDLIN ) 100 units/ 100 mL infusion (6.5 Units/hr Intravenous Infusion Verify 02/07/24 1605)  lactated ringers  infusion ( Intravenous Infusion Verify 02/07/24 1605)  dextrose  5 % in lactated ringers  infusion (0 mLs Intravenous Hold 02/07/24 1355)  dextrose  50 % solution 0-50 mL (has no administration in time range)  levothyroxine  (SYNTHROID ) tablet 112 mcg (has no administration in time range)  enoxaparin  (LOVENOX ) injection 30 mg (has no administration in time range)  Chlorhexidine  Gluconate Cloth 2 % PADS 6 each (has no administration in time range)  0.9 %  sodium chloride   infusion ( Intravenous Stopped 02/07/24 1604)  ceFEPIme  (MAXIPIME ) 2 g in sodium chloride  0.9 % 100 mL IVPB ( Intravenous Infusion Verify 02/07/24 1605)  fluticasone  furoate-vilanterol (BREO ELLIPTA ) 200-25 MCG/ACT 1 puff (has no administration in time range)  fluconazole  (DIFLUCAN ) tablet 200 mg (has no administration in time range)  predniSONE  (DELTASONE ) tablet 40 mg (has no administration in time range)  doxycycline  (VIBRA -TABS) tablet 100 mg (has no administration in time range)  ipratropium-albuterol  (DUONEB) 0.5-2.5 (3) MG/3ML nebulizer solution 3 mL (has no administration in time range)  ipratropium-albuterol  (DUONEB) 0.5-2.5 (3) MG/3ML nebulizer solution 3 mL (3 mLs Nebulization Given 02/07/24 1040)  sodium chloride  0.9 % bolus 1,000 mL (0 mLs Intravenous Stopped 02/07/24 1315)  cefTRIAXone  (ROCEPHIN ) 1 g in sodium chloride  0.9 % 100 mL IVPB (0 g Intravenous Stopped 02/07/24 1315)  azithromycin  (ZITHROMAX ) 500 mg in sodium chloride  0.9 % 250 mL IVPB (0 mg Intravenous Stopped 02/07/24 1428)  lactated ringers  bolus 936 mL (0 mLs Intravenous Stopped 02/07/24 1425)     IMPRESSION / MDM / ASSESSMENT AND PLAN / ED COURSE                                Differential diagnosis includes, but is not limited to, bronchospasm, pneumonia, DKA, HIV, electrolyte derangements   ED course: Patient presents and she appears unwell however pulmonary exam is not overwhelmingly consistent with bronchospasm.  I did order her a DuoNeb which did not improve her symptoms.  Will hold off on steroids initially given her multiple courses.  IV fluids initiated.  Chest x-ray on my independent review interpretation is unremarkable by awaiting radiologist read.  Patient's glucose did come back extremely elevated with CO2 derangements and acidosis.  I do think this is consistent with DKA and I have initiated an insulin  drip.  Patient updated and okay with staying in the emergency department.  Of note patient does have  several inflammatory markers including a leukocytosis and elevated lactic acidosis however I think this is most consistent with dehydration in the setting of DKA and multiple prednisone  courses but I  do have a procalcitonin pending.  I did make the decision to treat for possible infectious source while this is pending given her severe derangements.  Will discuss case with hospitalist.   Clinical Course as of 02/07/24 1615  Sun Feb 07, 2024  1004 WBC(!): 17.0 Elevated but has been on prednisone  [HD]  1123 Glucose(!!): 1,168 Very elevated [HD]  1123 Blood gas, venous(!) Consistent with acidosis [HD]  1123 CO2(!): 17 CO2 derangement [HD]  1124 Lactic Acid, Venous(!!): 3.4 Elevated however I suspect this is secondary to prerenal/DKA [HD]  1134 Case discussed with hospitalist for admission [HD]    Clinical Course User Index [HD] Nicholaus Rolland BRAVO, MD   -- Risk: 5 This patient has a high risk of morbidity due to further diagnostic testing or treatment. Rationale: This patients evaluation and management involve a high risk of morbidity due to the potential severity of presenting symptoms, need for diagnostic testing, and/or initiation of treatment that may require close monitoring. The differential includes conditions with potential for significant deterioration or requiring escalation of care. Treatment decisions in the ED, including medication administration, procedural interventions, or disposition planning, reflect this level of risk. COPA: 5 The patient has the following acute or chronic illness/injury that poses a possible threat to life or bodily function: [X] : The patient has a potentially serious acute condition or an acute exacerbation of a chronic illness requiring urgent evaluation and management in the Emergency Department. The clinical presentation necessitates immediate consideration of life-threatening or function-threatening diagnoses, even if they are ultimately ruled  out.   FINAL CLINICAL IMPRESSION(S) / ED DIAGNOSES   Final diagnoses:  Diabetic ketoacidosis without coma associated with type 2 diabetes mellitus (HCC)  Mouth lesion     Rx / DC Orders   ED Discharge Orders     None        Note:  This document was prepared using Dragon voice recognition software and may include unintentional dictation errors.   Nicholaus Rolland BRAVO, MD 02/07/24 1615  "

## 2024-02-07 NOTE — Progress Notes (Signed)
 PHARMACIST - PHYSICIAN COMMUNICATION  CONCERNING:  Enoxaparin  (Lovenox ) for DVT Prophylaxis    RECOMMENDATION: Patient was prescribed enoxaprin 40mg  q24 hours for VTE prophylaxis.   There were no vitals filed for this visit.  There is no height or weight on file to calculate BMI.  Estimated Creatinine Clearance: 23.6 mL/min (A) (by C-G formula based on SCr of 1.87 mg/dL (H)).   Patient is candidate for enoxaparin  30mg  every 24 hours based on CrCl <34ml/min or Weight <45kg  DESCRIPTION: Pharmacy has adjusted enoxaparin  dose per [facility] policy.  Patient is now receiving enoxaparin  30 mg every 24 hours    Estill CHRISTELLA Lutes, PharmD, BCPS Clinical Pharmacist 02/07/2024 11:49 AM

## 2024-02-07 NOTE — ED Triage Notes (Addendum)
 Pt to ED via POV from home. Pt reports SOB and mouth sores that has been going on for a few days. Pt reports was seen at [facility] and given breathing tx's and initially felt better but now feels worse. Pt reports has lost weight over the last few weeks. Pt has pale tent to fingertips with some ashy color. Cap refill greater than 3 secs.

## 2024-02-07 NOTE — ED Notes (Signed)
 Advised nurse that patient has ready bed

## 2024-02-07 NOTE — ED Notes (Signed)
Report called to [facility], ICU

## 2024-02-08 ENCOUNTER — Inpatient Hospital Stay

## 2024-02-08 DIAGNOSIS — E1110 Type 2 diabetes mellitus with ketoacidosis without coma: Secondary | ICD-10-CM | POA: Diagnosis not present

## 2024-02-08 LAB — BASIC METABOLIC PANEL WITH GFR
Anion gap: 10 (ref 5–15)
Anion gap: 11 (ref 5–15)
Anion gap: 12 (ref 5–15)
BUN: 26 mg/dL — ABNORMAL HIGH (ref 6–20)
BUN: 28 mg/dL — ABNORMAL HIGH (ref 6–20)
BUN: 34 mg/dL — ABNORMAL HIGH (ref 6–20)
CO2: 22 mmol/L (ref 22–32)
CO2: 22 mmol/L (ref 22–32)
CO2: 24 mmol/L (ref 22–32)
Calcium: 8.9 mg/dL (ref 8.9–10.3)
Calcium: 8.9 mg/dL (ref 8.9–10.3)
Calcium: 8.9 mg/dL (ref 8.9–10.3)
Chloride: 101 mmol/L (ref 98–111)
Chloride: 103 mmol/L (ref 98–111)
Chloride: 104 mmol/L (ref 98–111)
Creatinine, Ser: 1 mg/dL (ref 0.44–1.00)
Creatinine, Ser: 1.09 mg/dL — ABNORMAL HIGH (ref 0.44–1.00)
Creatinine, Ser: 1.14 mg/dL — ABNORMAL HIGH (ref 0.44–1.00)
GFR, Estimated: 55 mL/min — ABNORMAL LOW
GFR, Estimated: 58 mL/min — ABNORMAL LOW
GFR, Estimated: >60 mL/min
Glucose, Bld: 151 mg/dL — ABNORMAL HIGH (ref 70–99)
Glucose, Bld: 188 mg/dL — ABNORMAL HIGH (ref 70–99)
Glucose, Bld: 91 mg/dL (ref 70–99)
Potassium: 3.9 mmol/L (ref 3.5–5.1)
Potassium: 4.1 mmol/L (ref 3.5–5.1)
Potassium: 4.1 mmol/L (ref 3.5–5.1)
Sodium: 134 mmol/L — ABNORMAL LOW (ref 135–145)
Sodium: 137 mmol/L (ref 135–145)
Sodium: 137 mmol/L (ref 135–145)

## 2024-02-08 LAB — RESPIRATORY PANEL BY PCR

## 2024-02-08 LAB — URINALYSIS, COMPLETE (UACMP) WITH MICROSCOPIC
Bilirubin Urine: NEGATIVE
Glucose, UA: >=500 mg/dL — AB
Ketones, ur: NEGATIVE mg/dL
Nitrite: NEGATIVE
Protein, ur: 30 mg/dL — AB
Specific Gravity, Urine: 1.029 (ref 1.005–1.030)
pH: 6 (ref 5.0–8.0)

## 2024-02-08 LAB — GLUCOSE, CAPILLARY
Glucose-Capillary: 116 mg/dL — ABNORMAL HIGH (ref 70–99)
Glucose-Capillary: 141 mg/dL — ABNORMAL HIGH (ref 70–99)
Glucose-Capillary: 149 mg/dL — ABNORMAL HIGH (ref 70–99)
Glucose-Capillary: 162 mg/dL — ABNORMAL HIGH (ref 70–99)
Glucose-Capillary: 163 mg/dL — ABNORMAL HIGH (ref 70–99)
Glucose-Capillary: 165 mg/dL — ABNORMAL HIGH (ref 70–99)
Glucose-Capillary: 168 mg/dL — ABNORMAL HIGH (ref 70–99)
Glucose-Capillary: 195 mg/dL — ABNORMAL HIGH (ref 70–99)
Glucose-Capillary: 271 mg/dL — ABNORMAL HIGH (ref 70–99)
Glucose-Capillary: 58 mg/dL — ABNORMAL LOW (ref 70–99)
Glucose-Capillary: 59 mg/dL — ABNORMAL LOW (ref 70–99)
Glucose-Capillary: 67 mg/dL — ABNORMAL LOW (ref 70–99)
Glucose-Capillary: 71 mg/dL (ref 70–99)

## 2024-02-08 LAB — CBC
HCT: 42.8 % (ref 36.0–46.0)
Hemoglobin: 15 g/dL (ref 12.0–15.0)
MCH: 30.9 pg (ref 26.0–34.0)
MCHC: 35 g/dL (ref 30.0–36.0)
MCV: 88.1 fL (ref 80.0–100.0)
Platelets: 214 K/uL (ref 150–400)
RBC: 4.86 MIL/uL (ref 3.87–5.11)
RDW: 12.6 % (ref 11.5–15.5)
WBC: 15.5 K/uL — ABNORMAL HIGH (ref 4.0–10.5)
nRBC: 0 % (ref 0.0–0.2)

## 2024-02-08 LAB — HEMOGLOBIN A1C
Hgb A1c MFr Bld: 12.6 % — ABNORMAL HIGH (ref 4.8–5.6)
Mean Plasma Glucose: 314.92 mg/dL

## 2024-02-08 LAB — BETA-HYDROXYBUTYRIC ACID
Beta-Hydroxybutyric Acid: 0.19 mmol/L (ref 0.05–0.27)
Beta-Hydroxybutyric Acid: 0.29 mmol/L — ABNORMAL HIGH (ref 0.05–0.27)
Beta-Hydroxybutyric Acid: 0.31 mmol/L — ABNORMAL HIGH (ref 0.05–0.27)

## 2024-02-08 LAB — MAGNESIUM: Magnesium: 2.1 mg/dL (ref 1.7–2.4)

## 2024-02-08 LAB — PHOSPHORUS: Phosphorus: 1.6 mg/dL — ABNORMAL LOW (ref 2.5–4.6)

## 2024-02-08 LAB — LACTIC ACID, PLASMA: Lactic Acid, Venous: 2.7 mmol/L (ref 0.5–1.9)

## 2024-02-08 MED ORDER — SODIUM CHLORIDE 0.9 % IV SOLN
2.0000 g | Freq: Two times a day (BID) | INTRAVENOUS | Status: DC
  Administered 2024-02-08 – 2024-02-09 (×3): 2 g via INTRAVENOUS
  Filled 2024-02-08 (×4): qty 12.5

## 2024-02-08 MED ORDER — ADULT MULTIVITAMIN W/MINERALS CH
1.0000 | ORAL_TABLET | Freq: Every day | ORAL | Status: DC
  Administered 2024-02-09: 1 via ORAL
  Filled 2024-02-08: qty 1

## 2024-02-08 MED ORDER — SODIUM PHOSPHATES 45 MMOLE/15ML IV SOLN
15.0000 mmol | Freq: Once | INTRAVENOUS | Status: AC
  Administered 2024-02-08: 15 mmol via INTRAVENOUS
  Filled 2024-02-08: qty 5

## 2024-02-08 MED ORDER — LIVING WELL WITH DIABETES BOOK
Freq: Once | Status: AC
  Filled 2024-02-08: qty 1

## 2024-02-08 MED ORDER — INSULIN ASPART 100 UNIT/ML IJ SOLN
0.0000 [IU] | Freq: Three times a day (TID) | INTRAMUSCULAR | Status: DC
  Administered 2024-02-08 – 2024-02-09 (×4): 2 [IU] via SUBCUTANEOUS
  Filled 2024-02-08 (×4): qty 2

## 2024-02-08 MED ORDER — INSULIN GLARGINE 100 UNIT/ML ~~LOC~~ SOLN
8.0000 [IU] | Freq: Every day | SUBCUTANEOUS | Status: DC
  Administered 2024-02-09: 8 [IU] via SUBCUTANEOUS
  Filled 2024-02-08: qty 0.08

## 2024-02-08 MED ORDER — INSULIN GLARGINE 100 UNIT/ML ~~LOC~~ SOLN
5.0000 [IU] | Freq: Once | SUBCUTANEOUS | Status: AC
  Administered 2024-02-08: 5 [IU] via SUBCUTANEOUS
  Filled 2024-02-08: qty 0.05

## 2024-02-08 MED ORDER — THIAMINE HCL 100 MG PO TABS
100.0000 mg | ORAL_TABLET | Freq: Every day | ORAL | Status: DC
  Administered 2024-02-08 – 2024-02-09 (×2): 100 mg via ORAL
  Filled 2024-02-08 (×4): qty 1

## 2024-02-08 MED ORDER — MAGIC MOUTHWASH W/LIDOCAINE
5.0000 mL | Freq: Three times a day (TID) | ORAL | Status: DC | PRN
  Administered 2024-02-09 (×2): 5 mL via ORAL
  Filled 2024-02-08 (×4): qty 5

## 2024-02-08 MED ORDER — INSULIN ASPART 100 UNIT/ML IJ SOLN
0.0000 [IU] | INTRAMUSCULAR | Status: DC
  Administered 2024-02-08: 3 [IU] via SUBCUTANEOUS
  Filled 2024-02-08: qty 3

## 2024-02-08 MED ORDER — GLUCERNA SHAKE PO LIQD
237.0000 mL | Freq: Three times a day (TID) | ORAL | Status: DC
  Administered 2024-02-08 – 2024-02-09 (×2): 237 mL via ORAL

## 2024-02-08 MED ORDER — INSULIN ASPART 100 UNIT/ML IJ SOLN: 0.0000 [IU] | Freq: Every day | INTRAMUSCULAR | Status: DC

## 2024-02-08 MED ORDER — BENZOCAINE 10 % MT GEL
Freq: Four times a day (QID) | OROMUCOSAL | Status: DC | PRN
  Filled 2024-02-08: qty 9

## 2024-02-08 MED ORDER — INSULIN GLARGINE-YFGN 100 UNIT/ML ~~LOC~~ SOPN
5.0000 [IU] | PEN_INJECTOR | Freq: Once | SUBCUTANEOUS | Status: DC
  Filled 2024-02-08: qty 3

## 2024-02-08 MED ORDER — ENSURE PLUS HIGH PROTEIN PO LIQD: 237.0000 mL | Freq: Three times a day (TID) | ORAL | Status: DC

## 2024-02-08 MED ORDER — ACETAMINOPHEN 325 MG PO TABS
650.0000 mg | ORAL_TABLET | Freq: Four times a day (QID) | ORAL | Status: DC | PRN
  Administered 2024-02-09 (×2): 650 mg via ORAL
  Filled 2024-02-08 (×2): qty 2

## 2024-02-08 NOTE — Progress Notes (Signed)
 " PROGRESS NOTE    Tasha Nichols  FMW:[id number] DOB: 04/07/1965 DOA: 02/07/2024 PCP: Donzella Lauraine SAILOR, DO  Chief Complaint  Patient presents with   Shortness of Breath    Hospital Course:  Tasha Nichols is a 58 y.o. female with medical history significant of type II DM, hypothyroidism, HLD, HTN, tobacco use, presumed COPD presented to ED due to several weeks of worsening SOB, weight loss, generalized weakness. Reports using multiple courses of prednisone  and breathing treatments but continues to feel weak.  Also has noticed white patches in her mouth since 4 to 5 days. Patient was found to have DKA, AKI, mild COPD exacerbation and clinical picture suspicious for infectious etiology.  Hospital course as below  Subjective: Patient was examined at the bedside, states continues to have discomfort in her mouth, otherwise denies any other, Reports feeling significantly better today.  DKA has resolved and bridged with Lantus  PT/OT eval pending   Objective: Vitals:   02/08/24 1400 02/08/24 1415 02/08/24 1430 02/08/24 1445  BP: 97/65     Pulse:      Resp: 13 15 14 15   Temp:      TempSrc:      SpO2:      Weight:        Intake/Output Summary (Last 24 hours) at 02/08/2024 1607 Last data filed at 02/08/2024 1400 Gross per 24 hour  Intake 2168.98 ml  Output 1750 ml  Net 418.98 ml   Filed Weights   02/07/24 1303  Weight: 41 kg    Examination: General: Patient is very thin, no acute distress Patient has white lesions that are not vesicular on insides of cheek but can be consistent with Candida versus leukoplakia Neck: Normal range of motion Respiratory: Mild wheezing bilateral bases Cardiovascular: RRR GI: Abd SNT with no guarding or rebound  Extremities: pulses intact with good cap refills, no LE pitting edema or calf tenderness Neuro: AO x 3, no gross focal deficits Skin: Warm, dry, and intact  Assessment & Plan:  DKA - Resolved DM type II Suspect due to underlying  infectious etiology  vs Jardiance  use.  Reports ran out of Jardiance  1 week ago Was on steroids intermittently in November HbA1c 12.6 Home regimen: Jardiance , metformin  1000 mg bid, Januvia  100 mg daily S/p IV insulin  and IV fluids Lantus  8 units daily, SSI 0-9 Diabetes coordinator consult   AKI - resolved Likely due to dehydration S/p IV fluids, monitor creatinine   Suspect sepsis, unknown source Tachycardic, leukocytosis meets SIRS criteria, unknown source Lactic acidosis 3.4, Procalcitonin elevated 1.02 UA with 5-10 WBC, asymptomatic COVID/flu/RSV negative.  CXR negative. Pending RVP Continue IV cefepime  Follow-up blood cultures Trend lactic acid   White patches in mouth Suspect oral candidiasis vs leukoplakia Reports using multiple courses of oral and inhaled steroids We will treat with p.o. fluconazole  200mg  daily If no improvement, patient will need biopsy (high risk due to tobacco use)  Hypophosphatemia Monitor and replete as needed   Mild acute COPD exacerbation Tobacco use Prednisone  40 mg daily, doxycycline   continue home inhalers, DuoNebs as needed Smoking cessation counseled Nicotine  gums as needed   HTN Hold Lisinopril  due to soft BP   Acquired polycythemia Monitor Hb   Hypothyroidism Continue levothyroxine    Chronic pain Uses Dilaudid , clonidine, Valium, baclofen for pain pump  Underweight BMI 15.5 Seen by RD   Seen by PT/OT - no recs   DVT prophylaxis: Lovenox  SQ   Code Status: Full Code Disposition:  Home  Consultants:  None  Procedures:  None  Antimicrobials:  Anti-infectives (From admission, onward)    Start     Dose/Rate Route Frequency Ordered Stop   02/08/24 1000  ceFEPIme  (MAXIPIME ) 2 g in sodium chloride  0.9 % 100 mL IVPB        2 g 200 mL/hr over 30 Minutes Intravenous Every 12 hours 02/08/24 0757     02/07/24 2200  doxycycline  (VIBRA -TABS) tablet 100 mg        100 mg Oral Every 12 hours 02/07/24 1615     02/07/24 1700   fluconazole  (DIFLUCAN ) tablet 100 mg        100 mg Oral Daily 02/07/24 1613     02/07/24 1600  ceFEPIme  (MAXIPIME ) 2 g in sodium chloride  0.9 % 100 mL IVPB  Status:  Discontinued        2 g 200 mL/hr over 30 Minutes Intravenous Every 24 hours 02/07/24 1510 02/08/24 0757   02/07/24 1015  cefTRIAXone  (ROCEPHIN ) 1 g in sodium chloride  0.9 % 100 mL IVPB        1 g 200 mL/hr over 30 Minutes Intravenous  Once 02/07/24 1007 02/07/24 1315   02/07/24 1015  azithromycin  (ZITHROMAX ) 500 mg in sodium chloride  0.9 % 250 mL IVPB        500 mg 250 mL/hr over 60 Minutes Intravenous  Once 02/07/24 1007 02/07/24 1428       Data Reviewed: I have personally reviewed following labs and imaging studies CBC: Recent Labs  Lab 02/07/24 0951 02/08/24 0356  WBC 17.0* 15.5*  HGB 17.5* 15.0  HCT 52.7* 42.8  MCV 91.8 88.1  PLT 299 214   Basic Metabolic Panel: Recent Labs  Lab 02/07/24 0951 02/07/24 1458 02/07/24 1809 02/07/24 2332 02/08/24 0356 02/08/24 0800  NA 121* 129* 135 134* 137 137  K 5.3* 5.1 4.0 4.1 4.1 3.9  CL 82* 92* 99 101 103 104  CO2 17* 17* 21* 22 24 22   GLUCOSE 1,168* 813* 358* 188* 151* 91  BUN 47* 44* 39* 34* 28* 26*  CREATININE 1.87* 1.60* 1.41* 1.14* 1.09* 1.00  CALCIUM  9.6 9.0 9.4 8.9 8.9 8.9  MG 3.0*  --   --   --  2.1  --   PHOS 5.5*  --   --   --  1.6*  --    GFR: Estimated Creatinine Clearance: 38.7 mL/min (by C-G formula based on SCr of 1 mg/dL). Liver Function Tests: No results for input(s): AST, ALT, ALKPHOS, BILITOT, PROT, ALBUMIN in the last 168 hours. CBG: Recent Labs  Lab 02/08/24 0810 02/08/24 0840 02/08/24 0916 02/08/24 1126 02/08/24 1535  GLUCAP 59* 67* 71 195* 271*    Recent Results (from the past 240 hours)  Resp panel by RT-PCR (RSV, Flu A&B, Covid) Anterior Nasal Swab     Status: None   Collection Time: 02/07/24  9:54 AM   Specimen: Anterior Nasal Swab  Result Value Ref Range Status   SARS Coronavirus 2 by RT PCR NEGATIVE  NEGATIVE Final    Comment: (NOTE) SARS-CoV-2 target nucleic acids are NOT DETECTED.  The SARS-CoV-2 RNA is generally detectable in upper respiratory specimens during the acute phase of infection. The lowest concentration of SARS-CoV-2 viral copies this assay can detect is 138 copies/mL. A negative result does not preclude SARS-Cov-2 infection and should not be used as the sole basis for treatment or other patient management decisions. A negative result may occur with  improper specimen collection/handling, submission of specimen other than nasopharyngeal swab,  presence of viral mutation(s) within the areas targeted by this assay, and inadequate number of viral copies(<138 copies/mL). A negative result must be combined with clinical observations, patient history, and epidemiological information. The expected result is Negative.  Fact Sheet for Patients:  bloggercourse.com  Fact Sheet for Healthcare Providers:  seriousbroker.it  This test is no t yet approved or cleared by the [place]  FDA and  has been authorized for detection and/or diagnosis of SARS-CoV-2 by FDA under an Emergency Use Authorization (EUA). This EUA will remain  in effect (meaning this test can be used) for the duration of the COVID-19 declaration under Section 564(b)(1) of the Act, 21 U.S.C.section 360bbb-3(b)(1), unless the authorization is terminated  or revoked sooner.       Influenza A by PCR NEGATIVE NEGATIVE Final   Influenza B by PCR NEGATIVE NEGATIVE Final    Comment: (NOTE) The Xpert Xpress SARS-CoV-2/FLU/RSV plus assay is intended as an aid in the diagnosis of influenza from Nasopharyngeal swab specimens and should not be used as a sole basis for treatment. Nasal washings and aspirates are unacceptable for Xpert Xpress SARS-CoV-2/FLU/RSV testing.  Fact Sheet for Patients: bloggercourse.com  Fact Sheet for Healthcare  Providers: seriousbroker.it  This test is not yet approved or cleared by the [place]  FDA and has been authorized for detection and/or diagnosis of SARS-CoV-2 by FDA under an Emergency Use Authorization (EUA). This EUA will remain in effect (meaning this test can be used) for the duration of the COVID-19 declaration under Section 564(b)(1) of the Act, 21 U.S.C. section 360bbb-3(b)(1), unless the authorization is terminated or revoked.     Resp Syncytial Virus by PCR NEGATIVE NEGATIVE Final    Comment: (NOTE) Fact Sheet for Patients: bloggercourse.com  Fact Sheet for Healthcare Providers: seriousbroker.it  This test is not yet approved or cleared by the [place]  FDA and has been authorized for detection and/or diagnosis of SARS-CoV-2 by FDA under an Emergency Use Authorization (EUA). This EUA will remain in effect (meaning this test can be used) for the duration of the COVID-19 declaration under Section 564(b)(1) of the Act, 21 U.S.C. section 360bbb-3(b)(1), unless the authorization is terminated or revoked.  Performed at [facility], [address] [place], [place] [address]   Blood culture (routine x 2)     Status: None (Preliminary result)   Collection Time: 02/07/24 10:26 AM   Specimen: BLOOD  Result Value Ref Range Status   Specimen Description BLOOD BLOOD RIGHT FOREARM  Final   Special Requests   Final    BOTTLES DRAWN AEROBIC AND ANAEROBIC Blood Culture results may not be optimal due to an inadequate volume of blood received in culture bottles   Culture   Final    NO GROWTH < 24 HOURS Performed at [facility], [address] [place], [place] [address]    Report Status PENDING  Incomplete  MRSA Next Gen by PCR, Nasal     Status: None   Collection Time: 02/07/24 12:39 PM   Specimen: Nasal Mucosa; Nasal Swab  Result Value Ref Range Status   MRSA by PCR Next Gen  NOT DETECTED NOT DETECTED Final    Comment: (NOTE) The GeneXpert MRSA Assay (FDA approved for NASAL specimens only), is one component of a comprehensive MRSA colonization surveillance program. It is not intended to diagnose MRSA infection nor to guide or monitor treatment for MRSA infections. Test performance is not FDA approved in patients less than 69 years old. Performed at [facility],  [address] [place], [place] [address]   Blood culture (routine x 2)     Status: None (Preliminary result)   Collection Time: 02/07/24  1:24 PM   Specimen: BLOOD  Result Value Ref Range Status   Specimen Description BLOOD RIGHT ANTECUBITAL  Final   Special Requests   Final    BOTTLES DRAWN AEROBIC AND ANAEROBIC Blood Culture adequate volume   Culture   Final    NO GROWTH < 24 HOURS Performed at [facility], [address], [place], [place] [address]    Report Status PENDING  Incomplete     Radiology Studies: DG Chest 1 View Result Date: 02/08/2024 CLINICAL DATA:  No ammonia.  Heart failure. EXAM: DG CHEST 1V COMPARISON:  Radiograph yesterday FINDINGS: The lungs are hyperinflated. Central bronchial thickening. The heart is normal in size. Blunting of the costophrenic angles likely due to hyperinflation rather than pleural effusions. No pulmonary edema, confluent opacity, or pneumothorax. Stable osseous structures. IMPRESSION: Hyperinflation and central bronchial thickening, suggesting COPD. No acute findings. Electronically Signed   By: Andrea Gasman M.D.   On: 02/08/2024 14:03   DG Chest Port 1 View Result Date: 02/07/2024 CLINICAL DATA:  Shortness of breath.  Weight loss. EXAM: PORTABLE CHEST 1 VIEW COMPARISON:  01/03/2024 FINDINGS: The heart size and mediastinal contours are within normal limits. Marked pulmonary hyperinflation again seen, consistent with COPD. Both lungs are clear. IMPRESSION: COPD. No active disease. Electronically Signed   By: Norleen DELENA Kil M.D.   On:  02/07/2024 11:25    Scheduled Meds:  Chlorhexidine  Gluconate Cloth  6 each Topical Q0600   doxycycline   100 mg Oral Q12H   enoxaparin  (LOVENOX ) injection  30 mg Subcutaneous Q24H   feeding supplement (GLUCERNA SHAKE)  237 mL Oral TID BM   fluconazole   100 mg Oral Daily   fluticasone  furoate-vilanterol  1 puff Inhalation Daily   insulin  aspart  0-5 Units Subcutaneous QHS   insulin  aspart  0-9 Units Subcutaneous TID WC   levothyroxine   112 mcg Oral QAC breakfast   [START ON 02/09/2024] multivitamin with minerals  1 tablet Oral Daily   predniSONE   40 mg Oral Q breakfast   thiamine   100 mg Oral Daily   Continuous Infusions:  ceFEPime  (MAXIPIME ) IV Stopped (02/08/24 0949)   sodium PHOSPHATE  IVPB (in mmol) 43 mL/hr at 02/08/24 1602     LOS: 1 day  MDM: Patient is high risk for one or more organ failure.  They necessitate ongoing hospitalization for continued IV therapies and subsequent lab monitoring. Total time spent interpreting labs and vitals, reviewing the medical record, coordinating care amongst consultants and care team members, directly assessing and discussing care with the patient and/or family: 55 min Laree Lock, MD [facility]  To contact the attending physician between 7A-7P please use Epic Chat. To contact the covering physician during after hours 7P-7A, please review Amion.  02/08/2024, 4:07 PM   *This document has been created with the assistance of dictation software. Please excuse typographical errors. *   "

## 2024-02-08 NOTE — Progress Notes (Signed)
 Messaged MD to see of patient can have a diet and blood sugars changed to ACHS. Patient was given Lantus  and Insulin  was turned off at 4 a.m. today.  Patients blood sugar was 58 this morning and still NPO. MD said I could give the patient juice and stated that she would review and got back to me about a diet.

## 2024-02-08 NOTE — Inpatient Diabetes Management (Signed)
 Inpatient Diabetes Program Recommendations  AACE/ADA: New Consensus Statement on Inpatient Glycemic Control (2015)  Target Ranges:  Prepandial:   less than 140 mg/dL      Peak postprandial:   less than 180 mg/dL (1-2 hours)      Critically ill patients:  140 - 180 mg/dL   Lab Results  Component Value Date   GLUCAP 67 (L) 02/08/2024   HGBA1C 12.6 (H) 02/07/2024    Latest Reference Range & Units 02/08/24 04:13 02/08/24 07:37 02/08/24 08:10 02/08/24 08:40 02/08/24 09:16  Glucose-Capillary 70 - 99 mg/dL 850 (H) 58 (L) 59 (L) 67 (L) 71  (H): Data is abnormally high (L): Data is abnormally low  Latest Reference Range & Units 02/07/24 09:51  Sodium 135 - 145 mmol/L 121 (L)  Potassium 3.5 - 5.1 mmol/L 5.3 (H)  Chloride 98 - 111 mmol/L 82 (L)  CO2 22 - 32 mmol/L 17 (L)  Glucose 70 - 99 mg/dL 8,831 (HH)  Mean Plasma Glucose mg/dL 685.07  BUN 6 - 20 mg/dL 47 (H)  Creatinine 9.55 - 1.00 mg/dL 8.12 (H)  Calcium  8.9 - 10.3 mg/dL 9.6  Anion gap 5 - 15  23 (H)  (HH): Data is critically high (L): Data is abnormally low (H): Data is abnormally high  Diabetes history: DM2 Outpatient Diabetes medications:  Jardiance  25 mg daily Januvia  100 mg daily Metformin  1 gm bid Current orders for Inpatient glycemic control: Novolog  0-20 units q 4 hrs. Prednisone  40 mg daily  Inpatient Diabetes Program Recommendations:    Noted hypoglycemia post Novolog  correction. Patient remains NPO @ this time. -Decrease Novolog  correction to 0-9 units q 4 hrs.  Patient now ordered diet so orders changed to 0-9 tid, 0-5 hs correction. Spoke with patient regarding diabetes management. Patient has not been feeling like eating much but continued with Metformin . She ran out of Jardiance  approximately a weekago and does not have another prescription. States she has mostly been drinking fluids @ home due to sores in her mouth limiting eating. Also patient has been on steroids in November @ home. Reviewed A1c of 12.6  (average blood glucose of 315 over the past 2-3 months). Patient has not been checking CBGs recently either.  Thank you, Shekira Drummer E. Hikaru Delorenzo, RN, MSN, CNS, CDCES  Diabetes Coordinator Inpatient Glycemic Control Team Team Pager [phone] (8am-5pm) 02/08/2024 9:32 AM

## 2024-02-08 NOTE — Progress Notes (Signed)
 Initial Nutrition Assessment  DOCUMENTATION CODES:   Underweight  INTERVENTION:   Ensure Plus High Protein po TID, each supplement provides 350 kcal and 20 grams of protein  MVI po daily   Thiamine  100mg  po daily x 7 days   Pt at high refeed risk; recommend monitor potassium, magnesium  and phosphorus labs daily until stable  Daily weights   NUTRITION DIAGNOSIS:   Increased nutrient needs related to catabolic illness as evidenced by estimated needs.  GOAL:   Patient will meet greater than or equal to 90% of their needs  MONITOR:   PO intake, Supplement acceptance, Labs, Weight trends, I & O's, Skin  REASON FOR ASSESSMENT:   Consult Assessment of nutrition requirement/status  ASSESSMENT:   58 y/o female with h/o depression, anxiety, HLD, HTN, chronic pain, COPD, DM, GERD and hypothyroidism who is admitted with DKA, sepsis, AKI and COPD exacerbation.  RD working remotely.  Pt reports poor appetite and oral intake pta. Pt has mainly been drinking liquids secondary to mild nausea and mouth sores that developed 4-5 days pta. Spoke with RN, pt ate fairly well at breakfast this morning. RD will add supplements and vitamins to help pt meet her estimated needs. Pt is refeeding; electrolytes are being monitored and supplemented as needed. RD will add thiamine . Per chart, pt is down 27lbs(23%) over the past 8 months if her admission weight is correct; this is significant weight loss. Pt likely meets criteria for severe malnutrition but unable to diagnose at this time. RD will follow up to obtain exam and history.   Medications reviewed and include: doxycycline , lovenox , diflucan , insulin , synthroid , prednisone , cefepime , sodium phosphate    Labs reviewed: K 3.9 wnl, BUN 26(H), P 1.6(L), Mg 2.1 wnl Wbc- 15.5(H) Cbgs- 195, 71, 67 x 24 hrs  AIC 12.6(H)- 12/28  UOP-   NUTRITION - FOCUSED PHYSICAL EXAM: Unable to perform at this time   Diet Order:   Diet Order              Diet Carb Modified Room service appropriate? Yes  Diet effective now                  EDUCATION NEEDS:   No education needs have been identified at this time  Skin:  Skin Assessment: Reviewed RN Assessment  Last BM:  pta  Height:   Ht Readings from Last 1 Encounters:  01/27/24 5' 4 (1.626 m)    Weight:   Wt Readings from Last 1 Encounters:  02/07/24 41 kg    Ideal Body Weight:  54.5 kg  BMI:  Body mass index is 15.52 kg/m.  Estimated Nutritional Needs:   Kcal:  1400-1600kcal/day  Protein:  70-80g/day  Fluid:  1.3-1.5L/day  Augustin Shams MS, RD, LDN If unable to be reached, please send secure chat to RD inpatient available from 8:00a-4:00p daily

## 2024-02-08 NOTE — Evaluation (Addendum)
 Physical Therapy Evaluation Patient Details Name: Tasha Nichols MRN: [id number] DOB: 01-11-1966 Today's Date: 02/08/2024  History of Present Illness  Pt is a 58 y/o F admitted on 02/07/24 after presenting with c/o worsening SOB, weight loss, generalized weakness. Pt found to have DKA, AKI, & mild COPD exacerbation. Pt suspected to have underlying infectious etiology, work up pending. PMH: DM2, hypothyroidism, HLD, HTN, tobacco use, presumed COPD, anxiety, arthritis, chronic pain, GERD, neuromuscular disorder, PNA  Clinical Impression  Pt seen for PT evaluation with pt agreeable to tx. Pt reports prior to admission she was independent without AD, 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, living with spouse. On this date, pt is able to ambulate around unit without AD with CGA, no overt LOB. Pt appears to be close to baseline but will follow pt acutely for high level balance training.  Pt on room air throughout session.       If plan is discharge home, recommend the following: Assist for transportation   Can travel by private vehicle        Equipment Recommendations None recommended by PT  Recommendations for Other Services       Functional Status Assessment Patient has had a recent decline in their functional status and demonstrates the ability to make significant improvements in function in a reasonable and predictable amount of time.     Precautions / Restrictions Precautions Precautions: Fall Restrictions Weight Bearing Restrictions Per Provider Order: No      Mobility  Bed Mobility               General bed mobility comments: not tested, pt received & left sitting in recliner    Transfers Overall transfer level: Needs assistance Equipment used: None Transfers: Sit to/from Stand Sit to Stand: Supervision                Ambulation/Gait Ambulation/Gait assistance: Contact guard assist Gait Distance (Feet): 200 Feet Assistive device: None   Gait velocity: slightly decreased      General Gait Details: decreased LUE reciprocal arm swing  Stairs            Wheelchair Mobility     Tilt Bed    Modified Rankin (Stroke Patients Only)       Balance Overall balance assessment: Needs assistance   Sitting balance-Leahy Scale: Normal     Standing balance support: During functional activity, No upper extremity supported Standing balance-Leahy Scale: Fair                               Pertinent Vitals/Pain Pain Assessment Pain Assessment: Faces Faces Pain Scale: Hurts a little bit Pain Location: chronic hip pain Pain Descriptors / Indicators: Discomfort Pain Intervention(s): Monitored during session    Home Living Family/patient expects to be discharged to:: Private residence Living Arrangements: Spouse/significant other Available Help at Discharge: Family Type of Home: House Home Access: Stairs to enter Entrance Stairs-Rails: None Entrance Stairs-Number of Steps: 1   Home Layout: Two level;Able to live on main level with bedroom/bathroom Home Equipment: [place] - single point      Prior Function Prior Level of Function : Independent/Modified Independent;Driving             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, 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       Extremity/Trunk Assessment   Upper Extremity Assessment Upper Extremity Assessment: Overall WFL for tasks assessed    Lower Extremity Assessment Lower Extremity Assessment: Overall WFL for  tasks assessed    Cervical / Trunk Assessment Cervical / Trunk Assessment: Normal  Communication   Communication Communication: No apparent difficulties    Cognition Arousal: Alert Behavior During Therapy: WFL for tasks assessed/performed   PT - Cognitive impairments: No apparent impairments                         Following commands: Intact       Cueing Cueing Techniques: Verbal cues     General Comments General comments (skin integrity, edema, etc.): SPO2 > 90% when  good pleth waveform    Exercises Other Exercises Other Exercises: Pt performed 5x sit<>stand from recliner without BUE support with focus on BLE strengthening.   Assessment/Plan    PT Assessment Patient needs continued PT services  PT Problem List Decreased activity tolerance;Decreased strength;Decreased balance;Decreased mobility;Cardiopulmonary status limiting activity       PT Treatment Interventions DME instruction;Therapeutic activities;Modalities;Gait training;Therapeutic exercise;Stair training;Balance training;Patient/family education;Functional mobility training;Neuromuscular re-education    PT Goals (Current goals can be found in the Care Plan section)  Acute Rehab PT Goals Patient Stated Goal: get better, go home PT Goal Formulation: With patient Time For Goal Achievement: 02/22/24 Potential to Achieve Goals: Good    Frequency Min 1X/week     Co-evaluation               AM-PAC PT 6 Clicks Mobility  Outcome Measure Help needed turning from your back to your side while in a flat bed without using bedrails?: None Help needed moving from lying on your back to sitting on the side of a flat bed without using bedrails?: None Help needed moving to and from a bed to a chair (including a wheelchair)?: A Little Help needed standing up from a chair using your arms (e.g., wheelchair or bedside chair)?: None Help needed to walk in hospital room?: A Little Help needed climbing 3-5 steps with a railing? : A Little 6 Click Score: 21    End of Session   Activity Tolerance: Patient tolerated treatment well Patient left: in chair;with call bell/phone within reach   PT Visit Diagnosis: Unsteadiness on feet (R26.81);Muscle weakness (generalized) (M62.81)    Time: 8992-8976 PT Time Calculation (min) (ACUTE ONLY): 16 min   Charges:   PT Evaluation $PT Eval Low Complexity: 1 Low   PT General Charges $$ ACUTE PT VISIT: 1 Visit         Richerd Pinal, PT,  DPT 02/08/2024, 10:33 AM   Richerd CHRISTELLA Pinal 02/08/2024, 10:33 AM

## 2024-02-08 NOTE — Plan of Care (Signed)

## 2024-02-08 NOTE — Progress Notes (Signed)
 After patient drank her juice rechecked blood sugar and it was 59. MD placed a diet in for patient. Gave patient some peanut butter and crackers. Rechecked CBG 67. Patient was given more juice and CBG came up to 71.

## 2024-02-08 NOTE — Evaluation (Signed)
 Occupational Therapy Evaluation Patient Details Name: Tasha Nichols MRN: [id number] DOB: 11-Sep-1965 Today's Date: 02/08/2024   History of Present Illness   Tasha Nichols is a 58 y.o. female with medical history significant of type II DM, hypothyroidism, HLD, HTN, tobacco use, presumed COPD presented to ED due to several weeks of worsening SOB, weight loss, generalized weakness. Admitted for DKA, AKI, mild COPD exacerbation, and suspected sepsis.   Clinical Impressions Tasha Nichols was seen for OT evaluation this date. Prior to hospital admission, pt was IND, reports SPC use as needed in last week. [place] farm. Pt presents to acute OT demonstrating impaired ADL performance and functional mobility 2/2 decreased activity tolerance and functional balance deficits. Pt currently requires CGA for toilet t/f and standing grooming tasks. Noted x2 LOBs requiring stagger step and UE support to correct. SpO2 95% on RA at rest, difficulty obtaining reading with activity. Pt would benefit from skilled OT to address noted impairments and functional limitations (see below for any additional details). Upon hospital discharge, recommend no OT follow up.   If plan is discharge home, recommend the following:   Help with stairs or ramp for entrance     Functional Status Assessment   Patient has had a recent decline in their functional status and demonstrates the ability to make significant improvements in function in a reasonable and predictable amount of time.     Equipment Recommendations   None recommended by OT     Recommendations for Other Services         Precautions/Restrictions   Precautions Precautions: Fall Recall of Precautions/Restrictions: Intact Restrictions Weight Bearing Restrictions Per Provider Order: No     Mobility Bed Mobility Overal bed mobility: Modified Independent                  Transfers Overall transfer level: Needs  assistance Equipment used: None Transfers: Sit to/from Stand Sit to Stand: Contact guard assist                  Balance Overall balance assessment: Needs assistance Sitting-balance support: No upper extremity supported, Feet supported Sitting balance-Leahy Scale: Normal     Standing balance support: No upper extremity supported, During functional activity Standing balance-Leahy Scale: Poor                             ADL either performed or assessed with clinical judgement   ADL Overall ADL's : Needs assistance/impaired                                       General ADL Comments: CGA for toilet t/f and standing grooming tasks. MOD I don B socks in sitting      Pertinent Vitals/Pain Pain Assessment Pain Assessment: No/denies pain     Extremity/Trunk Assessment Upper Extremity Assessment Upper Extremity Assessment: Overall WFL for tasks assessed   Lower Extremity Assessment Lower Extremity Assessment: Generalized weakness       Communication Communication Communication: No apparent difficulties   Cognition Arousal: Alert Behavior During Therapy: WFL for tasks assessed/performed Cognition: No apparent impairments                               Following commands: Intact       Cueing  General Comments  Exercises     Shoulder Instructions      Home Living Family/patient expects to be discharged to:: Private residence Living Arrangements: Spouse/significant other Available Help at Discharge: Family Type of Home: House Home Access: Stairs to enter [employer] of Steps: 1   Home Layout: Two level;Able to live on main level with bedroom/bathroom               Home Equipment: [place] - single point          Prior Functioning/Environment Prior Level of Function : Independent/Modified Independent;Driving             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       OT Problem List: Decreased activity tolerance;Impaired balance (sitting and/or standing)   OT Treatment/Interventions: Self-care/ADL training;Therapeutic exercise;Energy conservation;DME and/or AE instruction;Therapeutic activities;Patient/family education      OT Goals(Current goals can be found in the care plan section)   Acute Rehab OT Goals Patient Stated Goal: to walk OT Goal Formulation: With patient Time For Goal Achievement: 02/22/24 Potential to Achieve Goals: Good ADL Goals Pt Will Perform Grooming: Independently;standing Pt Will Perform Lower Body Dressing: Independently;sit to/from stand Pt Will Transfer to Toilet: Independently;ambulating;regular height toilet   OT Frequency:  Min 2X/week    Co-evaluation              AM-PAC OT 6 Clicks Daily Activity     Outcome Measure Help from another person eating meals?: None Help from another person taking care of personal grooming?: A Little Help from another person toileting, which includes using toliet, bedpan, or urinal?: A Little Help from another person bathing (including washing, rinsing, drying)?: A Little Help from another person to put on and taking off regular upper body clothing?: None Help from another person to put on and taking off regular lower body clothing?: A Little 6 Click Score: 20   End of Session Nurse Communication: Mobility status  Activity Tolerance: Patient tolerated treatment well Patient left: in chair;with call bell/phone within reach  OT Visit Diagnosis: Other abnormalities of gait and mobility (R26.89);Muscle weakness (generalized) (M62.81)                Time: 9157-9143 OT Time Calculation (min): 14 min Charges:  OT General Charges $OT Visit: 1 Visit OT Evaluation $OT Eval Moderate Complexity: 1 Mod  Elston Slot, M.S. OTR/L  02/08/2024, 9:11 AM  ascom [phone]

## 2024-02-09 ENCOUNTER — Other Ambulatory Visit: Payer: Self-pay

## 2024-02-09 DIAGNOSIS — E1110 Type 2 diabetes mellitus with ketoacidosis without coma: Secondary | ICD-10-CM | POA: Diagnosis not present

## 2024-02-09 LAB — GLUCOSE, CAPILLARY
Glucose-Capillary: 173 mg/dL — ABNORMAL HIGH (ref 70–99)
Glucose-Capillary: 151 mg/dL — ABNORMAL HIGH (ref 70–99)

## 2024-02-09 LAB — CBC
HCT: 40.6 % (ref 36.0–46.0)
Hemoglobin: 13.8 g/dL (ref 12.0–15.0)
MCH: 30.9 pg (ref 26.0–34.0)
MCHC: 34 g/dL (ref 30.0–36.0)
MCV: 91 fL (ref 80.0–100.0)
Platelets: 177 K/uL (ref 150–400)
RBC: 4.46 MIL/uL (ref 3.87–5.11)
RDW: 12.8 % (ref 11.5–15.5)
WBC: 11.5 K/uL — ABNORMAL HIGH (ref 4.0–10.5)
nRBC: 0 % (ref 0.0–0.2)

## 2024-02-09 LAB — BASIC METABOLIC PANEL WITH GFR
Anion gap: 12 (ref 5–15)
BUN: 23 mg/dL — ABNORMAL HIGH (ref 6–20)
CO2: 21 mmol/L — ABNORMAL LOW (ref 22–32)
Calcium: 8.5 mg/dL — ABNORMAL LOW (ref 8.9–10.3)
Chloride: 103 mmol/L (ref 98–111)
Creatinine, Ser: 0.9 mg/dL (ref 0.44–1.00)
GFR, Estimated: >60 mL/min
Glucose, Bld: 174 mg/dL — ABNORMAL HIGH (ref 70–99)
Potassium: 4.2 mmol/L (ref 3.5–5.1)
Sodium: 135 mmol/L (ref 135–145)

## 2024-02-09 LAB — PHOSPHORUS: Phosphorus: 1.9 mg/dL — ABNORMAL LOW (ref 2.5–4.6)

## 2024-02-09 LAB — MAGNESIUM: Magnesium: 2.1 mg/dL (ref 1.7–2.4)

## 2024-02-09 MED ORDER — ACETAMINOPHEN 325 MG PO TABS
650.0000 mg | ORAL_TABLET | Freq: Four times a day (QID) | ORAL | 0 refills | Status: AC | PRN
  Filled 2024-02-09: qty 20, 3d supply, fill #0

## 2024-02-09 MED ORDER — FLUCONAZOLE 100 MG PO TABS
100.0000 mg | ORAL_TABLET | Freq: Every day | ORAL | 0 refills | Status: AC
  Filled 2024-02-09: qty 11, 11d supply, fill #0

## 2024-02-09 MED ORDER — DOXYCYCLINE HYCLATE 100 MG PO TABS
100.0000 mg | ORAL_TABLET | Freq: Two times a day (BID) | ORAL | 0 refills | Status: AC
  Filled 2024-02-09: qty 2, 1d supply, fill #0

## 2024-02-09 MED ORDER — SODIUM PHOSPHATES 45 MMOLE/15ML IV SOLN
15.0000 mmol | Freq: Once | INTRAVENOUS | Status: AC
  Administered 2024-02-09: 15 mmol via INTRAVENOUS
  Filled 2024-02-09: qty 5

## 2024-02-09 MED ORDER — PREDNISONE 20 MG PO TABS
ORAL_TABLET | ORAL | 0 refills | Status: AC
  Filled 2024-02-09: qty 11, 10d supply, fill #0

## 2024-02-09 NOTE — Progress Notes (Signed)
 Physical Therapy Treatment Patient Details Name: Tasha Nichols MRN: [id number] DOB: Dec 31, 1965 Today's Date: 02/09/2024   History of Present Illness Pt is a 58 y/o F admitted on 02/07/24 after presenting with c/o worsening SOB, weight loss, generalized weakness. Pt found to have DKA, AKI, & mild COPD exacerbation. Pt suspected to have underlying infectious etiology, work up pending. PMH: DM2, hypothyroidism, HLD, HTN, tobacco use, presumed COPD, anxiety, arthritis, chronic pain, GERD, neuromuscular disorder, PNA    PT Comments  OOB and completes x 2 laps on unit with no AD.  General gait unsteadiness which she attributes to lack of mobility.  She needs no outside assist to correct and does not feel like she needs and AD at this time  Pt comfortable with discharge plan and hopes to DC home soon.   If plan is discharge home, recommend the following: Assist for transportation   Can travel by private vehicle        Equipment Recommendations  None recommended by PT    Recommendations for Other Services       Precautions / Restrictions Precautions Precautions: Fall Recall of Precautions/Restrictions: Intact Restrictions Weight Bearing Restrictions Per Provider Order: No     Mobility  Bed Mobility Overal bed mobility: Modified Independent               Patient Response: Cooperative  Transfers Overall transfer level: Modified independent                      Ambulation/Gait Ambulation/Gait assistance: Modified independent (Device/Increase time), Supervision Gait Distance (Feet): 400 Feet Assistive device: None Gait Pattern/deviations: Step-through pattern, Staggering left, Staggering right Gait velocity: slightly decreased     General Gait Details: generally unsteady but no outright LOB's.  pt feels comfortable with gait   Stairs             Wheelchair Mobility     Tilt Bed Tilt Bed Patient Response: Cooperative  Modified Rankin (Stroke  Patients Only)       Balance Overall balance assessment: Mild deficits observed, not formally tested Sitting-balance support: Feet supported Sitting balance-Leahy Scale: Normal     Standing balance support: No upper extremity supported Standing balance-Leahy Scale: Fair                              Hotel Manager: No apparent difficulties  Cognition Arousal: Alert Behavior During Therapy: WFL for tasks assessed/performed   PT - Cognitive impairments: No apparent impairments                         Following commands: Intact      Cueing Cueing Techniques: Verbal cues  Exercises      General Comments        Pertinent Vitals/Pain Pain Assessment Pain Assessment: No/denies pain    Home Living                          Prior Function            PT Goals (current goals can now be found in the care plan section) Progress towards PT goals: Progressing toward goals    Frequency    Min 1X/week      PT Plan      Co-evaluation              AM-PAC PT 6 Clicks  Mobility   Outcome Measure  Help needed turning from your back to your side while in a flat bed without using bedrails?: None Help needed moving from lying on your back to sitting on the side of a flat bed without using bedrails?: None Help needed moving to and from a bed to a chair (including a wheelchair)?: None Help needed standing up from a chair using your arms (e.g., wheelchair or bedside chair)?: None Help needed to walk in hospital room?: None Help needed climbing 3-5 steps with a railing? : A Little 6 Click Score: 23    End of Session   Activity Tolerance: Patient tolerated treatment well Patient left: in chair;with call bell/phone within reach   PT Visit Diagnosis: Unsteadiness on feet (R26.81);Muscle weakness (generalized) (M62.81)     Time: 8986-8977 PT Time Calculation (min) (ACUTE ONLY): 9 min  Charges:    $Gait  Training: 8-22 mins PT General Charges $$ ACUTE PT VISIT: 1 Visit                   Lauraine Gills, PTA 02/09/2024, 10:35 AM

## 2024-02-09 NOTE — Progress Notes (Signed)
 Occupational Therapy Treatment Patient Details Name: Tasha Nichols MRN: [id number] DOB: 14-Feb-1965 Today's Date: 02/09/2024   History of present illness Pt is a 58 y/o F admitted on 02/07/24 after presenting with c/o worsening SOB, weight loss, generalized weakness. Pt found to have DKA, AKI, & mild COPD exacerbation. Pt suspected to have underlying infectious etiology, work up pending. PMH: DM2, hypothyroidism, HLD, HTN, tobacco use, presumed COPD, anxiety, arthritis, chronic pain, GERD, neuromuscular disorder, PNA   OT comments  Pt is supine in bed on arrival. Pleasant and agreeable to OT session. She denies pain. Pt performed bed mobility MOD I and STS, ADLs (LB dressing, toileting tasks and standing grooming tasks) and mobility during session with supervision, no LOB noted. She is overall moving well, discussed safety and falls prevention strategies upon return home. Pt verbalized understanding. Pt returned to bed with all needs in place and will cont to require skilled acute OT services to maximize her safety and IND to return to PLOF.       If plan is discharge home, recommend the following:  Help with stairs or ramp for entrance   Equipment Recommendations  None recommended by OT    Recommendations for Other Services      Precautions / Restrictions Precautions Precautions: Fall Recall of Precautions/Restrictions: Intact Restrictions Weight Bearing Restrictions Per Provider Order: No       Mobility Bed Mobility Overal bed mobility: Modified Independent                  Transfers Overall transfer level: Modified independent Equipment used: None Transfers: Sit to/from Stand Sit to Stand: Supervision, Modified independent (Device/Increase time)           General transfer comment: no physical assist required and good safety     Balance Overall balance assessment: Modified Independent Sitting-balance support: Feet supported Sitting balance-Leahy Scale:  Normal     Standing balance support: No upper extremity supported Standing balance-Leahy Scale: Fair Standing balance comment: no LOB during session                           ADL either performed or assessed with clinical judgement   ADL Overall ADL's : Needs assistance/impaired     Grooming: Wash/dry hands;Oral care;Wash/dry face;Standing;Supervision/safety               Lower Body Dressing: Supervision/safety;Sitting/lateral leans;Sit to/from stand   Toilet Transfer: Supervision/safety;Modified Independent;Ambulation;Regular Social Worker and Hygiene: Modified independent         General ADL Comments: good safety and no LOB during session for ADL performance    Extremity/Trunk Assessment              Vision       Perception     Praxis     Communication Communication Communication: No apparent difficulties   Cognition Arousal: Alert Behavior During Therapy: WFL for tasks assessed/performed                                 Following commands: Intact        Cueing   Cueing Techniques: Verbal cues  Exercises Other Exercises Other Exercises: Edu on good safety and falls prevention upon DC.    Shoulder Instructions       General Comments      Pertinent Vitals/ Pain       Pain Assessment Pain  Assessment: No/denies pain Pain Location: chronic hip pain Pain Descriptors / Indicators: Discomfort Pain Intervention(s): Monitored during session  Home Living                                          Prior Functioning/Environment              Frequency  Min 2X/week        Progress Toward Goals  OT Goals(current goals can now be found in the care plan section)  Progress towards OT goals: Progressing toward goals  Acute Rehab OT Goals Patient Stated Goal: go home OT Goal Formulation: With patient Time For Goal Achievement: 02/22/24 Potential to Achieve Goals: Good   Plan      Co-evaluation                 AM-PAC OT 6 Clicks Daily Activity     Outcome Measure   Help from another person eating meals?: None Help from another person taking care of personal grooming?: None Help from another person toileting, which includes using toliet, bedpan, or urinal?: A Little Help from another person bathing (including washing, rinsing, drying)?: A Little Help from another person to put on and taking off regular upper body clothing?: None Help from another person to put on and taking off regular lower body clothing?: A Little 6 Click Score: 21    End of Session    OT Visit Diagnosis: Other abnormalities of gait and mobility (R26.89);Muscle weakness (generalized) (M62.81)   Activity Tolerance Patient tolerated treatment well   Patient Left in bed;with call bell/phone within reach   Nurse Communication Mobility status        Time: 8550-8490 OT Time Calculation (min): 20 min  Charges: OT General Charges $OT Visit: 1 Visit OT Treatments $Self Care/Home Management : 8-22 mins  Tasha Nichols, OTR/L  02/09/2024, 3:44 PM   Tasha Nichols 02/09/2024, 3:41 PM

## 2024-02-09 NOTE — Discharge Summary (Signed)
 " Physician Discharge Summary   [name] MRN: [id number] DOB: 1965/06/25  Admit date:     02/07/2024  Discharge date: 02/09/2024  Discharge Physician: Laree Lock   PCP: Donzella Lauraine SAILOR, DO   Recommendations at discharge:   Follow-up with PCP within 1 week -Repeat BMP, mag, Phos, CBC Monitor BP, CBG and titrate meds as needed  Discharge Diagnoses: Principal Problem:   DKA (diabetic ketoacidosis) [facility])  Hospital Course: Tasha Nichols is a 58 y.o. female with medical history significant of type II DM, hypothyroidism, HLD, HTN, tobacco use, presumed COPD presented to ED due to several weeks of worsening SOB, weight loss, generalized weakness. Reports using multiple courses of prednisone  and breathing treatments but continues to feel weak.  Also has noticed white patches in her mouth since 4 to 5 days. Patient was found to have DKA, AKI, mild COPD exacerbation and clinical picture suspicious for infectious etiology.  Hospital course as below   DKA - Resolved DM type II Steroid-induced hyperglycemia Suspect due to Jardiance  use vs multiple courses of steroid use since November.  Reports ran out of Jardiance  1 week ago HbA1c increased from 10.2 on 01/27/24 to 12.7 on 02/07/24 Home regimen: Jardiance , metformin  1000 mg bid, Januvia  100 mg daily S/p IV insulin  and IV fluids - resolved Does not want to take insulin  at home, discharged on metformin , Januvia .  Held Jardiance  Seen by diabetes coordinator consult Follow-up with PCP outpatient   AKI - resolved Likely due to dehydration S/p IV fluids   Dehydration Tachycardic, leukocytosis (reactive) , no source of infection Lactic acidosis improving UA with 5-10 WBC, asymptomatic COVID/flu/RSV negative. RVP neg, CXR negative Blood culture NGTD Discontinue antibiotics, no source of infection   White patches in mouth - improved Reports using multiple courses of oral and inhaled steroids Improved, On po Fluconazole  x 14  days If no improvement, patient will need biopsy (high risk due to tobacco use) - improved, unlikely Leukoplakia  Hypophosphatemia Repleted   Mild acute COPD exacerbation Tobacco use Prednisone  taper, doxycycline   continue home inhalers Smoking cessation counseled Nicotine  gums as needed   HTN Hold Lisinopril  due to soft BP   Acquired polycythemia Monitor Hb   Hypothyroidism Continue levothyroxine    Chronic pain Uses Dilaudid , clonidine, Valium, baclofen for pain pump   Underweight BMI 15.5 Seen by RD   Seen by PT/OT - no recs  Pain control - [place]  Controlled Substance Reporting System database was reviewed. and patient was instructed, not to drive, operate heavy machinery, perform activities at heights, swimming or participation in water  activities or provide baby-sitting services while on Pain, Sleep and Anxiety Medications; until their outpatient Physician has advised to do so again. Also recommended to not to take more than prescribed Pain, Sleep and Anxiety Medications.  Consultants: None Procedures performed: None  Disposition: Home Diet recommendation:  Discharge Diet Orders (From admission, onward)     Start     Ordered   02/09/24 0000  Diet Carb Modified        02/09/24 1516            DISCHARGE MEDICATION: Allergies as of 02/09/2024       Reactions   Penicillins Rash, Dermatitis   Tolerated Cephalosporin Date: 04/05/20.   Morphine Itching, Nausea And Vomiting   morphine        Medication List     PAUSE taking these medications    empagliflozin  25 MG Tabs tablet Wait to take this until your  doctor or other care provider tells you to start again. Commonly known as: Jardiance  Take 1 tablet (25 mg total) by mouth daily.   lisinopril  2.5 MG tablet Wait to take this until your doctor or other care provider tells you to start again. Commonly known as: Zestril  Take 1 tablet (2.5 mg total) by mouth daily.       TAKE these  medications    acetaminophen  325 MG tablet Commonly known as: TYLENOL  Take 2 tablets (650 mg total) by mouth every 6 (six) hours as needed for mild pain (pain score 1-3) or moderate pain (pain score 4-6).   albuterol  108 (90 Base) MCG/ACT inhaler Commonly known as: VENTOLIN  HFA Inhale 1-2 puffs into the lungs every 6 (six) hours as needed for wheezing or shortness of breath.   baclofen 0.05 MG/ML injection Commonly known as: LIORESAL 50 mcg by Intrathecal route once.   Blood Glucose Monitoring Suppl Devi 1 each by Does not apply route daily before breakfast. May substitute to any manufacturer covered by patient's insurance.   BLOOD GLUCOSE TEST STRIPS Strp 1 each by In Vitro route daily before breakfast. May substitute to any manufacturer covered by patient's insurance.   budesonide -formoterol  160-4.5 MCG/ACT inhaler Commonly known as: SYMBICORT  Inhale 2 puffs into the lungs 2 (two) times daily.   Cholecalciferol 125 MCG (5000 UT) Tabs Take 5,000 Units by mouth daily.   cloNIDine 0.1 MG tablet Commonly known as: CATAPRES Take 1 tablet (0.1 mg total) by mouth 3 (three) times daily. In pain pump   diazepam 5 MG tablet Commonly known as: VALIUM Take 5 mg by mouth.   doxycycline  100 MG tablet Commonly known as: VIBRA -TABS Take 1 tablet (100 mg total) by mouth every 12 (twelve) hours for 1 day.   fluconazole  100 MG tablet Commonly known as: DIFLUCAN  Take 1 tablet (100 mg total) by mouth daily for 11 doses. Start taking on: February 10, 2024   HYDROmorphone  in sodium chloride  0.9 % 100 mL Inject into the vein continuous. In pain pump   ibuprofen  100 MG tablet Commonly known as: ADVIL  3 tablets 4 times a day by oral route.   Lancet Device Misc 1 each by Does not apply route daily before breakfast. May substitute to any manufacturer covered by patient's insurance.   levothyroxine  112 MCG tablet Commonly known as: SYNTHROID  Take 1 tablet (112 mcg total) by mouth  daily.   metFORMIN  500 MG 24 hr tablet Commonly known as: GLUCOPHAGE -XR Take 2 tablets (1,000 mg total) by mouth 2 (two) times daily with a meal.   naloxone 4 MG/0.1ML Liqd nasal spray kit Commonly known as: NARCAN Place into the nose.   predniSONE  20 MG tablet Commonly known as: DELTASONE  Take 2 tablets (40 mg total) by mouth daily with breakfast for 1 day, THEN 1.5 tablets (30 mg total) daily with breakfast for 3 days, THEN 1 tablet (20 mg total) daily with breakfast for 3 days, THEN 0.5 tablets (10 mg total) daily with breakfast for 3 days. Start taking on: February 10, 2024   sitaGLIPtin  100 MG tablet Commonly known as: Januvia  Take 1 tablet (100 mg total) by mouth daily.        Discharge Exam: Filed Weights   02/07/24 1303 02/09/24 0500  Weight: 41 kg 41.2 kg   General: Patient is very thin, no acute distress Patient has white lesions - almost resolved Neck: Normal range of motion Respiratory: clear to auscultation bilaterally Cardiovascular: RRR GI: Abd SNT with no guarding or rebound  Extremities: pulses intact with good cap refills, no LE pitting edema or calf tenderness Neuro: AO x 3, no gross focal deficits Skin: Warm, dry, and intact  Condition at discharge: fair  The results of significant diagnostics from this hospitalization (including imaging, microbiology, ancillary and laboratory) are listed below for reference.   Imaging Studies: DG Chest 1 View Result Date: 02/08/2024 CLINICAL DATA:  No ammonia.  Heart failure. EXAM: DG CHEST 1V COMPARISON:  Radiograph yesterday FINDINGS: The lungs are hyperinflated. Central bronchial thickening. The heart is normal in size. Blunting of the costophrenic angles likely due to hyperinflation rather than pleural effusions. No pulmonary edema, confluent opacity, or pneumothorax. Stable osseous structures. IMPRESSION: Hyperinflation and central bronchial thickening, suggesting COPD. No acute findings. Electronically Signed    By: Andrea Gasman M.D.   On: 02/08/2024 14:03   DG Chest Port 1 View Result Date: 02/07/2024 CLINICAL DATA:  Shortness of breath.  Weight loss. EXAM: PORTABLE CHEST 1 VIEW COMPARISON:  01/03/2024 FINDINGS: The heart size and mediastinal contours are within normal limits. Marked pulmonary hyperinflation again seen, consistent with COPD. Both lungs are clear. IMPRESSION: COPD. No active disease. Electronically Signed   By: Norleen DELENA Kil M.D.   On: 02/07/2024 11:25    Microbiology: Results for orders placed or performed during the hospital encounter of 02/07/24  Resp panel by RT-PCR (RSV, Flu A&B, Covid) Anterior Nasal Swab     Status: None   Collection Time: 02/07/24  9:54 AM   Specimen: Anterior Nasal Swab  Result Value Ref Range Status   SARS Coronavirus 2 by RT PCR NEGATIVE NEGATIVE Final    Comment: (NOTE) SARS-CoV-2 target nucleic acids are NOT DETECTED.  The SARS-CoV-2 RNA is generally detectable in upper respiratory specimens during the acute phase of infection. The lowest concentration of SARS-CoV-2 viral copies this assay can detect is 138 copies/mL. A negative result does not preclude SARS-Cov-2 infection and should not be used as the sole basis for treatment or other patient management decisions. A negative result may occur with  improper specimen collection/handling, submission of specimen other than nasopharyngeal swab, presence of viral mutation(s) within the areas targeted by this assay, and inadequate number of viral copies(<138 copies/mL). A negative result must be combined with clinical observations, patient history, and epidemiological information. The expected result is Negative.  Fact Sheet for Patients:  bloggercourse.com  Fact Sheet for Healthcare Providers:  seriousbroker.it  This test is no t yet approved or cleared by the [place]  FDA and  has been authorized for detection and/or diagnosis of  SARS-CoV-2 by FDA under an Emergency Use Authorization (EUA). This EUA will remain  in effect (meaning this test can be used) for the duration of the COVID-19 declaration under Section 564(b)(1) of the Act, 21 U.S.C.section 360bbb-3(b)(1), unless the authorization is terminated  or revoked sooner.       Influenza A by PCR NEGATIVE NEGATIVE Final   Influenza B by PCR NEGATIVE NEGATIVE Final    Comment: (NOTE) The Xpert Xpress SARS-CoV-2/FLU/RSV plus assay is intended as an aid in the diagnosis of influenza from Nasopharyngeal swab specimens and should not be used as a sole basis for treatment. Nasal washings and aspirates are unacceptable for Xpert Xpress SARS-CoV-2/FLU/RSV testing.  Fact Sheet for Patients: bloggercourse.com  Fact Sheet for Healthcare Providers: seriousbroker.it  This test is not yet approved or cleared by the [place]  FDA and has been authorized for detection and/or diagnosis of SARS-CoV-2 by FDA under an Emergency Use Authorization (EUA). This  EUA will remain in effect (meaning this test can be used) for the duration of the COVID-19 declaration under Section 564(b)(1) of the Act, 21 U.S.C. section 360bbb-3(b)(1), unless the authorization is terminated or revoked.     Resp Syncytial Virus by PCR NEGATIVE NEGATIVE Final    Comment: (NOTE) Fact Sheet for Patients: bloggercourse.com  Fact Sheet for Healthcare Providers: seriousbroker.it  This test is not yet approved or cleared by the [place]  FDA and has been authorized for detection and/or diagnosis of SARS-CoV-2 by FDA under an Emergency Use Authorization (EUA). This EUA will remain in effect (meaning this test can be used) for the duration of the COVID-19 declaration under Section 564(b)(1) of the Act, 21 U.S.C. section 360bbb-3(b)(1), unless the authorization is terminated  or revoked.  Performed at [facility], [address] [place], [place] [address]   Blood culture (routine x 2)     Status: None (Preliminary result)   Collection Time: 02/07/24 10:26 AM   Specimen: BLOOD  Result Value Ref Range Status   Specimen Description BLOOD BLOOD RIGHT FOREARM  Final   Special Requests   Final    BOTTLES DRAWN AEROBIC AND ANAEROBIC Blood Culture results may not be optimal due to an inadequate volume of blood received in culture bottles   Culture   Final    NO GROWTH 2 DAYS Performed at [facility], [address] [place], [place] [address]    Report Status PENDING  Incomplete  MRSA Next Gen by PCR, Nasal     Status: None   Collection Time: 02/07/24 12:39 PM   Specimen: Nasal Mucosa; Nasal Swab  Result Value Ref Range Status   MRSA by PCR Next Gen NOT DETECTED NOT DETECTED Final    Comment: (NOTE) The GeneXpert MRSA Assay (FDA approved for NASAL specimens only), is one component of a comprehensive MRSA colonization surveillance program. It is not intended to diagnose MRSA infection nor to guide or monitor treatment for MRSA infections. Test performance is not FDA approved in patients less than 105 years old. Performed at [facility], [address] [place], [place] [address]   Blood culture (routine x 2)     Status: None (Preliminary result)   Collection Time: 02/07/24  1:24 PM   Specimen: BLOOD  Result Value Ref Range Status   Specimen Description BLOOD RIGHT ANTECUBITAL  Final   Special Requests   Final    BOTTLES DRAWN AEROBIC AND ANAEROBIC Blood Culture adequate volume   Culture   Final    NO GROWTH 2 DAYS Performed at [facility], [address], [place], [place] [address]    Report Status PENDING  Incomplete  Respiratory (~20 pathogens) panel by PCR     Status: None   Collection Time: 02/08/24  9:00 AM   Specimen: Nasopharyngeal Swab; Respiratory  Result Value Ref Range Status   Adenovirus NOT  DETECTED NOT DETECTED Final   Coronavirus 229E NOT DETECTED NOT DETECTED Final    Comment: (NOTE) The Coronavirus on the Respiratory Panel, DOES NOT test for the novel  Coronavirus (2019 nCoV)    Coronavirus HKU1 NOT DETECTED NOT DETECTED Final   Coronavirus NL63 NOT DETECTED NOT DETECTED Final   Coronavirus OC43 NOT DETECTED NOT DETECTED Final   Metapneumovirus NOT DETECTED NOT DETECTED Final   Rhinovirus / Enterovirus NOT DETECTED NOT DETECTED Final   Influenza A NOT DETECTED NOT DETECTED Final   Influenza B NOT DETECTED NOT DETECTED Final   Parainfluenza Virus  1 NOT DETECTED NOT DETECTED Final   Parainfluenza Virus 2 NOT DETECTED NOT DETECTED Final   Parainfluenza Virus 3 NOT DETECTED NOT DETECTED Final   Parainfluenza Virus 4 NOT DETECTED NOT DETECTED Final   Respiratory Syncytial Virus NOT DETECTED NOT DETECTED Final   Bordetella pertussis NOT DETECTED NOT DETECTED Final   Bordetella Parapertussis NOT DETECTED NOT DETECTED Final   Chlamydophila pneumoniae NOT DETECTED NOT DETECTED Final   Mycoplasma pneumoniae NOT DETECTED NOT DETECTED Final    Comment: Performed at [facility], [address], [place], [place] [address]    Labs: CBC: Recent Labs  Lab 02/07/24 [phone] 02/08/24 0356 02/09/24 0554  WBC 17.0* 15.5* 11.5*  HGB 17.5* 15.0 13.8  HCT 52.7* 42.8 40.6  MCV 91.8 88.1 91.0  PLT 299 214 177   Basic Metabolic Panel: Recent Labs  Lab 02/07/24 0951 02/07/24 1458 02/07/24 1809 02/07/24 2332 02/08/24 0356 02/08/24 0800 02/09/24 0554  NA 121*   < > 135 134* 137 137 135  K 5.3*   < > 4.0 4.1 4.1 3.9 4.2  CL 82*   < > 99 101 103 104 103  CO2 17*   < > 21* 22 24 22  21*  GLUCOSE 1,168*   < > 358* 188* 151* 91 174*  BUN 47*   < > 39* 34* 28* 26* 23*  CREATININE 1.87*   < > 1.41* 1.14* 1.09* 1.00 0.90  CALCIUM  9.6   < > 9.4 8.9 8.9 8.9 8.5*  MG 3.0*  --   --   --  2.1  --  2.1  PHOS 5.5*  --   --   --  1.6*  --  1.9*   < > = values in this interval not  displayed.   Liver Function Tests: No results for input(s): AST, ALT, ALKPHOS, BILITOT, PROT, ALBUMIN in the last 168 hours. CBG: Recent Labs  Lab 02/08/24 1535 02/08/24 1643 02/08/24 2139 02/09/24 0735 02/09/24 1244  GLUCAP 271* 168* 162* 173* 151*    Discharge time spent: greater than 30 minutes.  Signed: Laree Lock, MD [facility] 02/09/2024 "

## 2024-02-09 NOTE — TOC CM/SW Note (Signed)
 Transition of Care [facility]) - Inpatient Brief Assessment   Patient Details  Name: Tasha Nichols MRN: [id number] Date of Birth: Dec 06, 1965  Transition of Care [facility]) CM/SW Contact:    Nathanael CHRISTELLA Ring, RN Phone Number: 02/09/2024, 8:41 AM   Clinical Narrative:   Transition of Care [facility]) Screening Note   Patient Details  Name: Tasha Nichols Date of Birth: [birth year]   Transition of Care [facility]) CM/SW Contact:    Nathanael CHRISTELLA Ring, RN Phone Number: 02/09/2024, 8:41 AM    Transition of Care Department [facility]) has reviewed patient and no TOC needs have been identified at this time.  If new patient transition needs arise, please place a TOC consult.    Transition of Care Asessment: Insurance and Status: Insurance coverage has been reviewed Patient has primary care physician: Yes Home environment has been reviewed: from home Prior level of function:: independent Prior/Current Home Services: No current home services Social Drivers of Health Review: SDOH reviewed no interventions necessary Readmission risk has been reviewed: Yes Transition of care needs: no transition of care needs at this time

## 2024-02-09 NOTE — Inpatient Diabetes Management (Addendum)
 Inpatient Diabetes Program Recommendations  AACE/ADA: New Consensus Statement on Inpatient Glycemic Control (2015)  Target Ranges:  Prepandial:   less than 140 mg/dL      Peak postprandial:   less than 180 mg/dL (1-2 hours)      Critically ill patients:  140 - 180 mg/dL   Lab Results  Component Value Date   GLUCAP 173 (H) 02/09/2024   HGBA1C 12.6 (H) 02/07/2024    Diabetes history: DM2 Outpatient Diabetes medications:  Jardiance  25 mg daily Januvia  100 mg daily Metformin  1 gm bid Current orders for Inpatient glycemic control:  Lantus  8 units daily Novolog  0-9 tid, 0-5 units hs correction Prednisone  40 mg daily  Inpatient Diabetes Program Recommendations:   Spoke with patient regarding home diabetes management. Patient prefers not to start on insulin  @ this time, and prefers to go back on orals. May consider not restarting Jardiance  until followup with PCP due to DKA admission. Patient has glucose meter and supplies and agrees to check CBGs @ home 4 times daily and take meter or log of CBGs to PCP appt. Patient has PCP locally and agrees for followup appt. And states she is now aware of how important controlling and keeping up with her CBGs is. Updated Dr. Jerelene via phone call on patient.  Thank you, Devario Bucklew E. Breeze Angell, RN, MSN, CNS, CDCES  Diabetes Coordinator Inpatient Glycemic Control Team Team Pager [phone] (8am-5pm) 02/09/2024 9:46 AM

## 2024-02-09 NOTE — Progress Notes (Signed)
 Mobility Specialist Progress Note:    02/09/24 0915  Mobility  Activity Ambulated with assistance  Level of Assistance Standby assist, set-up cues, supervision of patient - no hands on  Assistive Device None  Distance Ambulated (ft) 160 ft  Range of Motion/Exercises Active;All extremities  Activity Response Tolerated well  Mobility visit 1 Mobility  Mobility Specialist Start Time (ACUTE ONLY) 0912  Mobility Specialist Stop Time (ACUTE ONLY) [id number]  Mobility Specialist Time Calculation (min) (ACUTE ONLY) 16 min   Pt received in bed, agreeable to mobility. Required SBA to stand and ambulate with no AD. Tolerated well, c/o legs feeling wiggly today. Pt eager for more ambulation and mobility throughout the rest of the day. Returned to room, all needs met.  Sherrilee Ditty Mobility Specialist Please contact via Special Educational Needs Teacher or  Rehab office at [phone]

## 2024-02-10 ENCOUNTER — Telehealth: Payer: Self-pay

## 2024-02-10 NOTE — Transitions of Care (Post Inpatient/ED Visit) (Signed)
 "  02/10/2024  Name: Tasha Nichols MRN: [id number] DOB: November 12, 1965  Today's TOC FU Call Status: Today's TOC FU Call Status:: Successful TOC FU Call Completed TOC FU Call Complete Date: 02/10/24  Patient's Name and Date of Birth confirmed. Name, DOB  Transition Care Management Follow-up Telephone Call Date of Discharge: 02/09/24 Discharge Facility: [facility]) Type of Discharge: Inpatient Admission Primary Inpatient Discharge Diagnosis:: DKA (diabetic ketoacidosis) How have you been since you were released from the hospital?: Better Any questions or concerns?: No  Items Reviewed: Did you receive and understand the discharge instructions provided?: Yes Medications obtained,verified, and reconciled?: Yes (Medications Reviewed) Any new allergies since your discharge?: No Do you have support at home?: Yes People in Home [RPT]: spouse Name of Support/Comfort Primary Source: Lorrene  Medications Reviewed Today: Medications Reviewed Today     Reviewed by Eilleen Richerd GRADE, RN (Registered Nurse) on 02/10/24 at 0957  Med List Status: <None>   Medication Order Taking? Sig Documenting Provider Last Dose Status Informant  acetaminophen  (TYLENOL ) 325 MG tablet [id number] Yes Take 2 tablets (650 mg total) by mouth every 6 (six) hours as needed for mild pain (pain score 1-3) or moderate pain (pain score 4-6). Jerelene Critchley, MD  Active   albuterol  (VENTOLIN  HFA) 108 (90 Base) MCG/ACT inhaler [id number] Yes Inhale 1-2 puffs into the lungs every 6 (six) hours as needed for wheezing or shortness of breath. Donzella Lauraine SAILOR, DO  Active Self, Other  baclofen (LIORESAL) 0.05 MG/ML injection [id number]  50 mcg by Intrathecal route once. [provider]  Active Self, Other           Med Note LESLY, RICHERD GRADE [place] Feb 10, 2024  9:54 AM) Active with [facility]  Blood Glucose Monitoring Suppl DEVI [id number] Yes 1 each by Does not apply route  daily before breakfast. May substitute to any manufacturer covered by patient's insurance. Donzella Lauraine SAILOR, DO  Active Self, Other  budesonide -formoterol  (SYMBICORT ) 160-4.5 MCG/ACT inhaler [id number] Yes Inhale 2 puffs into the lungs 2 (two) times daily. Donzella Lauraine SAILOR, DO  Active Self, Other  Cholecalciferol 125 MCG (5000 UT) TABS [id number]  Take 5,000 Units by mouth daily.  Patient not taking: Reported on 02/07/2024   [provider]  Active Self, Other  cloNIDine (CATAPRES) 0.1 MG tablet [id number] Yes Take 1 tablet (0.1 mg total) by mouth 3 (three) times daily. In pain pump Vivienne Delon HERO, PA-C  Active Self, Other           Med Note ZENA NATHANAEL LITTIE [place] Feb 07, 2024 12:12 PM) Runs through pain pump continuously   diazepam (VALIUM) 5 MG tablet [id number] Yes Take 5 mg by mouth. [provider]  Active Self, Other           Med Note ZENA NATHANAEL LITTIE [place] Feb 07, 2024 12:14 PM) Runs through pain pump continuously   doxycycline  (VIBRA -TABS) 100 MG tablet [id number] Yes Take 1 tablet (100 mg total) by mouth every 12 (twelve) hours for 1 day. Jerelene Critchley, MD  Active   empagliflozin  (JARDIANCE ) 25 MG TABS tablet [id number]  Take 1 tablet (25 mg total) by mouth daily. Donzella Lauraine SAILOR, DO  Active Self, Other           Med Note LESLY, RICHERD GRADE [place] Feb 10, 2024  9:55 AM) 02/10/24 Knows to Pause per patient  fluconazole  (DIFLUCAN ) 100 MG tablet [id number] Yes  Take 1 tablet (100 mg total) by mouth daily for 11 doses. Jerelene Critchley, MD  Active   Glucose Blood (BLOOD GLUCOSE TEST STRIPS) STRP [id number]  1 each by In Vitro route daily before breakfast. May substitute to any manufacturer covered by patient's insurance. Donzella Lauraine SAILOR, DO  Active Self, Other  HYDROmorphone  in sodium chloride  0.9 % 100 mL [id number]  Inject into the vein continuous. In pain pump Vivienne Delon HERO, PA-C  Active Self, Other           Med Note DOREAN, LINDSAY E   Thu Apr 05, 2020  5:56  AM) Runs through pain pump continuously  ibuprofen  (ADVIL ) 100 MG tablet [id number] Yes 3 tablets 4 times a day by oral route. [provider]  Active Self, Other  Lancet Device MISC [id number]  1 each by Does not apply route daily before breakfast. May substitute to any manufacturer covered by patient's insurance. Donzella Lauraine SAILOR, DO  Active Self, Other  levothyroxine  (SYNTHROID ) 112 MCG tablet [id number] Yes Take 1 tablet (112 mcg total) by mouth daily. Donzella Lauraine SAILOR, DO  Active Self, Other  lisinopril  (ZESTRIL ) 2.5 MG tablet [id number]  Take 1 tablet (2.5 mg total) by mouth daily. Donzella Lauraine SAILOR, DO  Active Self, Other           Med Note LESLY, RICHERD CINDERELLA [place] Feb 10, 2024  9:56 AM) 02/10/24 paused noted and patient aware  metFORMIN  (GLUCOPHAGE -XR) 500 MG 24 hr tablet [id number] Yes Take 2 tablets (1,000 mg total) by mouth 2 (two) times daily with a meal. Pardue, Lauraine SAILOR, DO  Active Self, Other  naloxone (NARCAN) nasal spray 4 mg/0.1 mL [id number] Yes Place into the nose. [provider]  Active Self, Other  predniSONE  (DELTASONE ) 20 MG tablet [id number] Yes Take 2 tablets (40 mg total) by mouth daily with breakfast for 1 day, THEN 1.5 tablets (30 mg total) daily with breakfast for 3 days, THEN 1 tablet (20 mg total) daily with breakfast for 3 days, THEN 0.5 tablets (10 mg total) daily with breakfast for 3 days. Jerelene Critchley, MD  Active            Med Note LESLY, RICHERD CINDERELLA [place] Feb 10, 2024  9:57 AM) Patient states she has the instructions and understands the taper  sitaGLIPtin  (JANUVIA ) 100 MG tablet [id number] Yes Take 1 tablet (100 mg total) by mouth daily. Donzella Lauraine SAILOR, DO  Active Self, Other            Home Care and Equipment/Supplies: Were Home Health Services Ordered?: No Any new equipment or medical supplies ordered?: No  Functional Questionnaire: Do you need assistance with bathing/showering or dressing?: No Do you need assistance with meal  preparation?: No Do you need assistance with eating?: No Do you have difficulty maintaining continence: No Do you need assistance with getting out of bed/getting out of a chair/moving?: No Do you have difficulty managing or taking your medications?: No  Follow up appointments reviewed: PCP Follow-up appointment confirmed?:  (Patient states she prefers to contact PCP office for appointment - may be getting a new provider she says) Specialist Hospital Follow-up appointment confirmed?: NA Do you need transportation to your follow-up appointment?: No Do you understand care options if your condition(s) worsen?: Yes-patient verbalized understanding  SDOH Interventions Today    Flowsheet Row Most Recent Value  SDOH Interventions   Food Insecurity Interventions Intervention Not Indicated  Housing Interventions Intervention Not Indicated  Education for self-mgmt of diabetes provided during this outreach regarding: -s/s of worsening condition and when to seek medical attention -importance of completing all post discharge hospital hospital follow up appts -adherence to med regimen [facility] TOC 30-day program enrollment reviewed and discussed with pt/caregiver. They have declined enrollment in 30 day TOC program due to: feeling much better and doesn't feel it's needed right now. Encouraged to follow up with PCP for needs. Patient verbalized understanding.   Richerd Fish, RN, BSN, CCM [facility] Management Coordinator Direct Dial: [phone]        "

## 2024-02-12 LAB — CULTURE, BLOOD (ROUTINE X 2)
Culture: NO GROWTH
Culture: NO GROWTH
Special Requests: ADEQUATE

## 2024-02-17 ENCOUNTER — Encounter: Payer: Self-pay | Admitting: Family Medicine

## 2024-02-17 ENCOUNTER — Ambulatory Visit
Admission: RE | Admit: 2024-02-17 | Discharge: 2024-02-17 | Disposition: A | Discharge status: Home / Self Care | Source: Ambulatory Visit | Attending: Family Medicine | Admitting: Family Medicine

## 2024-02-17 ENCOUNTER — Ambulatory Visit (INDEPENDENT_AMBULATORY_CARE_PROVIDER_SITE_OTHER): Admitting: Family Medicine

## 2024-02-17 VITALS — BP 99/64 | HR 87 | Resp 16 | Wt 95.7 lb

## 2024-02-17 DIAGNOSIS — D72829 Elevated white blood cell count, unspecified: Secondary | ICD-10-CM

## 2024-02-17 DIAGNOSIS — E1110 Type 2 diabetes mellitus with ketoacidosis without coma: Secondary | ICD-10-CM

## 2024-02-17 DIAGNOSIS — E1165 Type 2 diabetes mellitus with hyperglycemia: Secondary | ICD-10-CM

## 2024-02-17 DIAGNOSIS — Z09 Encounter for follow-up examination after completed treatment for conditions other than malignant neoplasm: Secondary | ICD-10-CM | POA: Diagnosis present

## 2024-02-17 DIAGNOSIS — R600 Localized edema: Secondary | ICD-10-CM

## 2024-02-17 DIAGNOSIS — J449 Chronic obstructive pulmonary disease, unspecified: Secondary | ICD-10-CM | POA: Diagnosis not present

## 2024-02-17 DIAGNOSIS — R5381 Other malaise: Secondary | ICD-10-CM

## 2024-02-17 DIAGNOSIS — Z794 Long term (current) use of insulin: Secondary | ICD-10-CM | POA: Diagnosis not present

## 2024-02-17 MED ORDER — FREESTYLE LIBRE 3 PLUS SENSOR MISC: 3 refills | Status: AC

## 2024-02-17 MED ORDER — LANTUS SOLOSTAR 100 UNIT/ML ~~LOC~~ SOPN: 4.0000 [IU] | PEN_INJECTOR | Freq: Every day | SUBCUTANEOUS | 99 refills | Status: AC

## 2024-02-17 MED ORDER — GLIMEPIRIDE 2 MG PO TABS: 2.0000 mg | ORAL_TABLET | Freq: Every day | ORAL | 0 refills | Status: DC

## 2024-02-17 NOTE — Progress Notes (Addendum)
 "     Established patient visit   [name]   DOB: 05/09/1965   59 y.o. Female  MRN: [id number] Visit Date: 02/17/2024  Today's healthcare provider: LAURAINE LOISE BUOY, DO   Chief Complaint  Patient presents with   Hospitalization Follow-up    Patient here today for hospital follow-up. Admit date:02/07/2024 and Discharge date: 02/09/2024 Patient was treated for  DKA (diabetic ketoacidosis   Subjective    HPI Tasha Nichols is a 59 year old female with diabetes who presents with hand pain and swelling in the feet.  She experiences significant pain in her fingers and hands, particularly at night, which causes her to cry. Her hands hurt when washing them. This pain began after her recent hospitalization and is associated with frequent finger pricking for blood sugar monitoring. No neuropathy was present prior to the hospitalization.  Her feet appear very purplish-red and swollen, particularly since Sunday, with the swelling being new and concerning. No pain in her legs, but there is discomfort due to the swelling. Her feet always feel cold, and she did not notice the color change before.  She was hospitalized from December 28th to December 30th and has been experiencing weakness in her legs since then. She mentions feeling the best she has in a week despite the weakness.  She is currently not on insulin  and is taking metformin , two tablets in the morning and two in the evening. Her blood sugar was last recorded at 431 mg/dL. She expresses frustration with dietary restrictions and difficulty gaining weight, noting that she has lost weight and is currently lower than before.  She completed a course of fluconazole  for oral patches, which improved her symptoms, although she still experiences some taste disturbances. She is not taking lisinopril  due to low blood pressure and is not on clonidine or hydromorphone  tablets.       Medications: Show/hide medication list[1]        Objective    BP 99/64 (BP Location: Left Arm, Patient Position: Sitting, Cuff Size: Normal)   Pulse 87   Resp 16   Wt 95 lb 11.2 oz (43.4 kg)   SpO2 98%   BMI 16.43 kg/m     Physical Exam Vitals and nursing note reviewed.  Constitutional:      General: She is not in acute distress.    Appearance: Normal appearance.  HENT:     Head: Normocephalic and atraumatic.     Mouth/Throat:     Lips: No lesions.     Mouth: Mucous membranes are moist. No oral lesions.  Eyes:     General: No scleral icterus.    Conjunctiva/sclera: Conjunctivae normal.  Cardiovascular:     Rate and Rhythm: Normal rate.  Pulmonary:     Effort: Pulmonary effort is normal.  Musculoskeletal:     Right lower leg: Edema (+1 and extending to foot) present.     Left lower leg: Edema (+1 and extending to foot) present.  Skin:    Comments: Purplish-red discoloration to toes bilaterally (new for patient)  Neurological:     Mental Status: She is alert and oriented to person, place, and time. Mental status is at baseline.  Psychiatric:        Mood and Affect: Mood normal.        Behavior: Behavior normal.      No results found for any visits on 02/17/24.  Assessment & Plan    Hospital discharge follow-up -  US  Venous Img Lower Bilateral (DVT); Future -     Ambulatory referral to Endocrinology  Physical deconditioning  Bilateral lower extremity edema -     US  Venous Img Lower Bilateral (DVT); Future  Type 2 diabetes mellitus with hyperglycemia, with long-term current use of insulin  (HCC) -     Basic metabolic panel with GFR -     Ambulatory referral to Endocrinology -     Lantus  SoloStar; Inject 4 Units into the skin daily.  Dispense: 15 mL; Refill: PRN -     FreeStyle Libre 3 Plus Sensor; Change sensor every 15 days.  Dispense: 6 each; Refill: 3  Hypophosphatemia -     Phosphorus  Leukocytosis, unspecified type -     CBC with Differential/Platelet  Hypermagnesemia -     Magnesium   Chronic  obstructive pulmonary disease, unspecified COPD type [facility])     Hospital discharge follow-up; type 2 diabetes mellitus with hyperglycemia, with long-term current use of insulin  Recent DKA with elevated A1c and blood glucose. Discussed insulin  therapy and potential euglycemic DKA risk with Jardiance . Considered insulin  pump and continuous glucose monitoring. - Continue metformin -XR, 1000 mg in the morning and 1000 mg in the evening. - Continue Januvia  100 mg daily - Start insulin  glargine 4 units nightly. --- If blood sugars remain consistently >150, may increase insulin  glargine dose by 1-2 units every 3 days.  - Keep Jardiance  on hold. - Keep lisinopril  on hold until blood pressure normalizes.  Reassess on follow-up. - Referred to endocrinology for insulin  pump evaluation. - Applied Freestyle Libre continuous glucose monitor. - Educated on insulin  pump and continuous glucose monitoring.  Bilateral lower extremity edema, rule out deep vein thrombosis New bilateral lower extremity edema with discoloration to toes. Differential includes DVT. - Ordered bilateral lower extremity ultrasound to rule out deep vein thrombosis.  Physical deconditioning Post-hospitalization deconditioning with leg weakness. Continue working on increasing activity daily, with frequent breaks as needed.    Return in about 4 weeks (around 03/16/2024) for DM.      I discussed the assessment and treatment plan with the patient  The patient was provided an opportunity to ask questions and all were answered. The patient agreed with the plan and demonstrated an understanding of the instructions.   The patient was advised to call back or seek an in-person evaluation if the symptoms worsen or if the condition fails to improve as anticipated.    LAURAINE LOISE BUOY, DO  [facility] [phone] (phone) [phone] (fax)  [facility]     [1]  Outpatient Medications Prior to  Visit  Medication Sig   acetaminophen  (TYLENOL ) 325 MG tablet Take 2 tablets (650 mg total) by mouth every 6 (six) hours as needed for mild pain (pain score 1-3) or moderate pain (pain score 4-6).   albuterol  (VENTOLIN  HFA) 108 (90 Base) MCG/ACT inhaler Inhale 1-2 puffs into the lungs every 6 (six) hours as needed for wheezing or shortness of breath.   Blood Glucose Monitoring Suppl DEVI 1 each by Does not apply route daily before breakfast. May substitute to any manufacturer covered by patient's insurance.   cloNIDine (CATAPRES) 0.1 MG tablet Take 1 tablet (0.1 mg total) by mouth 3 (three) times daily. In pain pump   diazepam (VALIUM) 5 MG tablet Take 5 mg by mouth.   fluconazole  (DIFLUCAN ) 100 MG tablet Take 1 tablet (100 mg total) by mouth daily for 11 doses.   Glucose Blood (BLOOD GLUCOSE TEST STRIPS) STRP  1 each by In Vitro route daily before breakfast. May substitute to any manufacturer covered by patient's insurance.   HYDROmorphone  in sodium chloride  0.9 % 100 mL Inject into the vein continuous. In pain pump   ibuprofen  (ADVIL ) 100 MG tablet 3 tablets 4 times a day by oral route.   Lancet Device MISC 1 each by Does not apply route daily before breakfast. May substitute to any manufacturer covered by patient's insurance.   levothyroxine  (SYNTHROID ) 112 MCG tablet Take 1 tablet (112 mcg total) by mouth daily.   metFORMIN  (GLUCOPHAGE -XR) 500 MG 24 hr tablet Take 2 tablets (1,000 mg total) by mouth 2 (two) times daily with a meal.   naloxone (NARCAN) nasal spray 4 mg/0.1 mL Place into the nose.   predniSONE  (DELTASONE ) 20 MG tablet Take 2 tablets (40 mg total) by mouth daily with breakfast for 1 day, THEN 1.5 tablets (30 mg total) daily with breakfast for 3 days, THEN 1 tablet (20 mg total) daily with breakfast for 3 days, THEN 0.5 tablets (10 mg total) daily with breakfast for 3 days.   sitaGLIPtin  (JANUVIA ) 100 MG tablet Take 1 tablet (100 mg total) by mouth daily.   baclofen (LIORESAL) 0.05  MG/ML injection 50 mcg by Intrathecal route once. (Patient not taking: Reported on 02/17/2024)   budesonide -formoterol  (SYMBICORT ) 160-4.5 MCG/ACT inhaler Inhale 2 puffs into the lungs 2 (two) times daily.   Cholecalciferol 125 MCG (5000 UT) TABS Take 5,000 Units by mouth daily. (Patient not taking: Reported on 02/17/2024)   [Paused] empagliflozin  (JARDIANCE ) 25 MG TABS tablet Take 1 tablet (25 mg total) by mouth daily.   [Paused] lisinopril  (ZESTRIL ) 2.5 MG tablet Take 1 tablet (2.5 mg total) by mouth daily.   No facility-administered medications prior to visit.   "

## 2024-02-17 NOTE — Patient Instructions (Addendum)
 If blood sugars remain consistently >150, may increase insulin  glargine dose by 1-2 units every 3 days.

## 2024-02-17 NOTE — Addendum Note (Signed)
 Addended by: DONZELLA DOMINO on: 02/17/2024 12:05 PM   Modules accepted: Orders

## 2024-02-19 ENCOUNTER — Telehealth: Payer: Self-pay

## 2024-02-19 ENCOUNTER — Ambulatory Visit: Payer: Self-pay | Admitting: Family Medicine

## 2024-02-19 LAB — OPHTHALMOLOGY REPORT-SCANNED

## 2024-02-19 NOTE — Telephone Encounter (Signed)
 Copied from CRM #[id number]. Topic: Clinical - Prescription Issue >> Feb 19, 2024 11:53 AM Donna BRAVO wrote: Reason for CRM: Patient would like to speak with nurse regarding taking medication. Patient is confused and would like to speak with someone.

## 2024-03-10 NOTE — Progress Notes (Signed)
 Tasha Nichols                                          MRN: [id number]   03/10/2024   The [facility] Quality Team Specialist reviewed this patient medical record for the purposes of chart review for care gap closure. The following were reviewed: chart review for care gap closure-glycemic status assessment.    [facility] Quality Team

## 2024-07-27 ENCOUNTER — Encounter: Admitting: Family Medicine
